# Patient Record
Sex: Male | Born: 1955 | Race: White | Hispanic: No | Marital: Single | Smoking: Former smoker
Health system: Southern US, Community
[De-identification: ages and names within clinical notes are randomized; demographics above are authoritative.]

## PROBLEM LIST (undated history)

## (undated) DIAGNOSIS — I5022 Chronic systolic (congestive) heart failure: Secondary | ICD-10-CM

## (undated) DIAGNOSIS — Q2381 Bicuspid aortic valve: Secondary | ICD-10-CM

## (undated) DIAGNOSIS — I509 Heart failure, unspecified: Secondary | ICD-10-CM

## (undated) HISTORY — PX: FOREIGN BODY REMOVAL: SHX962

## (undated) HISTORY — PX: HERNIA REPAIR: SHX51

## (undated) HISTORY — PX: ANKLE FRACTURE SURGERY: SHX122

---

## 1998-06-04 DIAGNOSIS — W3400XA Accidental discharge from unspecified firearms or gun, initial encounter: Secondary | ICD-10-CM

## 1998-06-04 HISTORY — DX: Accidental discharge from unspecified firearms or gun, initial encounter: W34.00XA

## 2007-09-20 ENCOUNTER — Emergency Department (HOSPITAL_COMMUNITY): Admission: EM | Admit: 2007-09-20 | Discharge: 2007-09-20 | Payer: Self-pay | Admitting: Emergency Medicine

## 2007-10-10 ENCOUNTER — Ambulatory Visit (HOSPITAL_COMMUNITY): Admission: RE | Admit: 2007-10-10 | Discharge: 2007-10-10 | Payer: Self-pay | Admitting: General Surgery
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
## 2010-10-17 NOTE — Op Note (Signed)
[name], [name] NO.:  [id number]   MEDICAL RECORD NO.:  [id number]          PATIENT TYPE:  AMB   LOCATION:  DAY                           FACILITY:  [facility]   PHYSICIAN:  Tilford Pillar, MD      DATE OF BIRTH:  May 19, 1956   DATE OF PROCEDURE:  DATE OF DISCHARGE:                               OPERATIVE REPORT   PREOPERATIVE DIAGNOSIS:  Left inguinal hernia, reducible.   POSTOPERATIVE DIAGNOSIS:  Left inguinal hernia, reducible.   PROCEDURE:  Left inguinal herniorrhaphy with mesh.   SURGEON:  Tilford Pillar, MD   ANESTHESIA:  General endotracheal local anesthetic 1% Sensorcaine plain.   ESTIMATED BLOOD LOSS:  Minimal.   SPECIMEN:  None.   INDICATIONS:  The patient is a 55 year old male who presented to my  office as an outpatient with an acute onset of increasing left lower  quadrant and left groin pain.  On evaluation, he was determined to have  a left inguinal hernia, this was quite large but easily reducible.  The  risks, benefits, and alternatives of repair were discussed at length  with the patient including but not limited to bleeding, infection, mesh  infection requiring the removal, and interval repair of the hernia,  ischemic orchitis, paresthesias as well as possibility of testicular  loss as well as the possibility of intraoperative pulmonary or cardiac  events.  The patient's questions and concerns were addressed.  The  patient was consented for planned procedure.   OPERATION:  The patient was taken to the operating room and was placed  in the supine position on the operating table.  General anesthetic was  administered.  Once the patient was asleep, he was endotracheally  intubated by Anesthesia.  At this point, the patient's left groin was  prepped and draped in the usual fashion with Betadine solution.  A  marking pen was utilized to mark the planned site of incision.  Scalpel  was utilized to make the initial skin incision.  Additional  dissection  down through subcuticular tissue including Scarpa layer was carried out  using electrocautery, this was carried out down to the external oblique  fascia, which was scored with the scalpel, and then the defect was  enlarged medially and laterally with Metzenbaum scissors.  At this  point, the inguinal canal was obtained.  A large hernia was identified.  Blunt digital dissection was utilized to create the window behind the  cord structures.  A Penrose drain was then placed around this to help  elevate the cord structures, then the hernia sac into the field.  At  this point, using a combination of blunt DeBakey dissection and  electrocautery dissection, the hernia sac was separated free from the  cord structures.  The internal inguinal ring was noted to have a large  sizeable defect.  Since the hernia base had a large defect, a 3-0  Prolene suture was placed in a pursestring fashion to help reduce the  size of the hernia neck.  The hernia was reduced through the  pursestring, and the pursestring was secured.  At this  point, a medium  mesh plug was then placed and the remaining defect was packed to the  surrounding inguinal ring wall with a 2-0 Novofil suture x2, the suture  is in place.  A mesh overlay was brought to the field.  This was packed  medially over the pubic tubercle, superiorly to the conjoined tendon,  and inferiorly to the shelving portion of the inguinal ligament.  The  keyhole defect was enlarged, allowing placement around the cord  structures, and then the resulting tails were tied behind the cord  structures laterally with a 2-0 Novofil.  The tails were then tucked  underneath the external oblique fascia with the mesh in excellent  position.  I was quite pleased with the repair.  At this point, the  wound was irrigated.  A 2-0 Vicryl was utilized to reapproximate the  external oblique fascia.  Local anesthetic was instilled.  Then, a 3-0  Vicryl was utilized to  reapproximate the Scarpa layer, followed by 4-0  Monocryl to reapproximate the skin edges in a running subcuticular  suture.  Skin was washed and dried with moistened dry towel.  Benzoin  was applied around the incision.  Half-inch Steri-Strips were placed,  and the drapes removed.  The patient was allowed to come out of general  anesthetic.  He was transferred back to a regular hospital bed and was  transferred to postanesthetic care unit in stable condition.   At the conclusion of the procedure, all instrument, sponge, and needle  counts were correct.   The patient tolerated the procedure well.       Tilford Pillar, MD  Electronically Signed     BZ/MEDQ  D:  10/10/2007  T:  10/11/2007  Job:  [id number]

## 2019-11-19 ENCOUNTER — Encounter (HOSPITAL_COMMUNITY): Payer: Self-pay | Admitting: *Deleted

## 2019-11-19 ENCOUNTER — Emergency Department (HOSPITAL_COMMUNITY)
Admission: EM | Admit: 2019-11-19 | Discharge: 2019-11-19 | Disposition: A | Discharge status: Home / Self Care | Payer: Self-pay | Attending: Emergency Medicine | Admitting: Emergency Medicine

## 2019-11-19 ENCOUNTER — Other Ambulatory Visit: Payer: Self-pay

## 2019-11-19 DIAGNOSIS — R202 Paresthesia of skin: Secondary | ICD-10-CM

## 2019-11-19 DIAGNOSIS — Z885 Allergy status to narcotic agent status: Secondary | ICD-10-CM | POA: Insufficient documentation

## 2019-11-19 DIAGNOSIS — Z880 Allergy status to penicillin: Secondary | ICD-10-CM | POA: Insufficient documentation

## 2019-11-19 DIAGNOSIS — Z87891 Personal history of nicotine dependence: Secondary | ICD-10-CM | POA: Insufficient documentation

## 2019-11-19 DIAGNOSIS — M722 Plantar fascial fibromatosis: Secondary | ICD-10-CM | POA: Insufficient documentation

## 2019-11-19 LAB — CBC
HCT: 41.8 % (ref 39.0–52.0)
Hemoglobin: 14 g/dL (ref 13.0–17.0)
MCH: 31 pg (ref 26.0–34.0)
MCHC: 33.5 g/dL (ref 30.0–36.0)
MCV: 92.7 fL (ref 80.0–100.0)
Platelets: 171 10*3/uL (ref 150–400)
RBC: 4.51 MIL/uL (ref 4.22–5.81)
RDW: 12.9 % (ref 11.5–15.5)
WBC: 4.5 10*3/uL (ref 4.0–10.5)
nRBC: 0 % (ref 0.0–0.2)

## 2019-11-19 LAB — COMPREHENSIVE METABOLIC PANEL
ALT: 18 U/L (ref 0–44)
AST: 18 U/L (ref 15–41)
Albumin: 4.5 g/dL (ref 3.5–5.0)
Alkaline Phosphatase: 95 U/L (ref 38–126)
Anion gap: 10 (ref 5–15)
BUN: 9 mg/dL (ref 8–23)
CO2: 27 mmol/L (ref 22–32)
Calcium: 9.4 mg/dL (ref 8.9–10.3)
Chloride: 98 mmol/L (ref 98–111)
Creatinine, Ser: 0.94 mg/dL (ref 0.61–1.24)
GFR calc Af Amer: >60 mL/min (ref >60)
GFR calc non Af Amer: >60 mL/min (ref >60)
Glucose, Bld: 117 mg/dL — ABNORMAL HIGH (ref 70–99)
Potassium: 4.5 mmol/L (ref 3.5–5.1)
Sodium: 135 mmol/L (ref 135–145)
Total Bilirubin: 0.5 mg/dL (ref 0.3–1.2)
Total Protein: 8 g/dL (ref 6.5–8.1)

## 2019-11-19 LAB — MAGNESIUM: Magnesium: 2.3 mg/dL (ref 1.7–2.4)
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
## 2019-11-19 NOTE — Clinical Social Work Note (Signed)
Transition of Care [facility]) - Emergency Department Mini Assessment  Patient Details  Name: [name] Daniel MRN: [id number] Date of Birth: 03/26/1956  Transition of Care [facility]) CM/SW Contact:    Ewing Schlein, LCSW Phone Number: 11/19/2019, 10:07 PM  Clinical Narrative: Patient is a 65 year old patient who presented to the ED for bilateral foot numbness. TOC received consult for PCP needs as patient is uninsured and does not currently have a PCP. CSW spoke with patient regarding [facility] and financial counselor referrals. Patient agreeable to both. CSW provided patient with [facility] patient packet. CSW left voicemail with [facility] to make referral. CSW emailed financial counselor, Jerene Dilling, to refer patient for possible Medicaid eligibility. TOC signing off.  ED Mini Assessment: What brought you to the Emergency Department? : Bilateral foot numbness Barriers to Discharge: Barriers Resolved Barrier interventions: Referral to [facility]; referral to financial counselor Means of departure: Car Interventions which prevented an admission or readmission: Other (must enter comment) (Referral to [facility] for PCP needs; referral to financial counselor)  Patient Contact and Communications Key Contact 1: [facility] Key Contact 2: [name] Daniel (financial counselor) Contact Date: 11/19/19,     Patient states their goals for this hospitalization and ongoing recovery are:: Get connected to PCP  Admission diagnosis:  Feet Numbness There are no problems to display for this patient.  PCP:  Patient, No Pcp Per Pharmacy:  No Pharmacies Listed

## 2019-11-19 NOTE — Discharge Instructions (Signed)
Your physical exam and history is consistent with peripheral neuropathy and plantar fasciitis.  You may ultimately require referral to a neurologist for your peripheral neuropathy and referral to a podiatrist for your plantar fasciitis, however it is most important that you first establish with a primary care provider.  You need to have continuity of care with a single provider who can then refer you elsewhere, if needed.  I have placed an order for you to be consulted by our transitions of care team to help you find a PCP.  Please call around to get established with somebody as soon as possible.  Your symptoms do not improve until their further work-up.  There is no emergent condition that requires intervention here today.  Continue with over-the-counter medications for symptomatic relief of your discomfort until you can be seen by a primary care provider.  Return to the ED or seek immediate medical attention to experience any new or worsening symptoms.

## 2019-11-19 NOTE — ED Triage Notes (Signed)
Pt with bilateral foot numbness for past 5 days. Pt denies hx of DM with mother did.

## 2019-12-09 ENCOUNTER — Ambulatory Visit: Payer: Self-pay | Admitting: Physician Assistant

## 2019-12-09 ENCOUNTER — Encounter: Payer: Self-pay | Admitting: Physician Assistant

## 2019-12-09 VITALS — BP 140/100 | HR 71 | Temp 97.5°F | Ht 71.5 in | Wt 155.8 lb

## 2019-12-09 DIAGNOSIS — Z125 Encounter for screening for malignant neoplasm of prostate: Secondary | ICD-10-CM

## 2019-12-09 DIAGNOSIS — Z13220 Encounter for screening for lipoid disorders: Secondary | ICD-10-CM

## 2019-12-09 DIAGNOSIS — Z7689 Persons encountering health services in other specified circumstances: Secondary | ICD-10-CM

## 2019-12-09 DIAGNOSIS — R7309 Other abnormal glucose: Secondary | ICD-10-CM

## 2019-12-09 DIAGNOSIS — I10 Essential (primary) hypertension: Secondary | ICD-10-CM

## 2019-12-09 DIAGNOSIS — Z131 Encounter for screening for diabetes mellitus: Secondary | ICD-10-CM

## 2019-12-09 DIAGNOSIS — M722 Plantar fascial fibromatosis: Secondary | ICD-10-CM

## 2019-12-09 DIAGNOSIS — Z1211 Encounter for screening for malignant neoplasm of colon: Secondary | ICD-10-CM

## 2019-12-09 DIAGNOSIS — M25572 Pain in left ankle and joints of left foot: Secondary | ICD-10-CM

## 2019-12-09 DIAGNOSIS — Z9889 Other specified postprocedural states: Secondary | ICD-10-CM

## 2019-12-09 MED ORDER — LISINOPRIL 10 MG PO TABS: 10.0000 mg | ORAL_TABLET | Freq: Every day | ORAL | 0 refills | Status: DC

## 2019-12-09 NOTE — Patient Instructions (Signed)
Plantar Fasciitis  Plantar fasciitis is a painful foot condition that affects the heel. It occurs when the band of tissue that connects the toes to the heel bone (plantar fascia) becomes irritated. This can happen as the result of exercising too much or doing other repetitive activities (overuse injury). The pain from plantar fasciitis can range from mild irritation to severe pain that makes it difficult to walk or move. The pain is usually worse in the morning after sleeping, or after sitting or lying down for a while. Pain may also be worse after long periods of walking or standing. What are the causes? This condition may be caused by:  Standing for long periods of time.  Wearing shoes that do not have good arch support.  Doing activities that put stress on joints (high-impact activities), including running, aerobics, and ballet.  Being overweight.  An abnormal way of walking (gait).  Tight muscles in the back of your lower leg (calf).  High arches in your feet.  Starting a new athletic activity. What are the signs or symptoms? The main symptom of this condition is heel pain. Pain may:  Be worse with first steps after a time of rest, especially in the morning after sleeping or after you have been sitting or lying down for a while.  Be worse after long periods of standing still.  Decrease after 30-45 minutes of activity, such as gentle walking. How is this diagnosed? This condition may be diagnosed based on your medical history and your symptoms. Your health care provider may ask questions about your activity level. Your health care provider will do a physical exam to check for:  A tender area on the bottom of your foot.  A high arch in your foot.  Pain when you move your foot.  Difficulty moving your foot. You may have imaging tests to confirm the diagnosis, such as:  X-rays.  Ultrasound.  MRI. How is this treated? Treatment for plantar fasciitis depends on how  severe your condition is. Treatment may include:  Rest, ice, applying pressure (compression), and raising the affected foot (elevation). This may be called RICE therapy. Your health care provider may recommend RICE therapy along with over-the-counter pain medicines to manage your pain.  Exercises to stretch your calves and your plantar fascia.  A splint that holds your foot in a stretched, upward position while you sleep (night splint).  Physical therapy to relieve symptoms and prevent problems in the future.  Injections of steroid medicine (cortisone) to relieve pain and inflammation.  Stimulating your plantar fascia with electrical impulses (extracorporeal shock wave therapy). This is usually the last treatment option before surgery.  Surgery, if other treatments have not worked after 12 months. Follow these instructions at home:  Managing pain, stiffness, and swelling  If directed, put ice on the painful area: ? Put ice in a plastic bag, or use a frozen bottle of water. ? Place a towel between your skin and the bag or bottle. ? Roll the bottom of your foot over the bag or bottle. ? Do this for 20 minutes, 2-3 times a day.  Wear athletic shoes that have air-sole or gel-sole cushions, or try wearing soft shoe inserts that are designed for plantar fasciitis.  Raise (elevate) your foot above the level of your heart while you are sitting or lying down. Activity  Avoid activities that cause pain. Ask your health care provider what activities are safe for you.  Do physical therapy exercises and stretches as told   by your health care provider.  Try activities and forms of exercise that are easier on your joints (low-impact). Examples include swimming, water aerobics, and biking. General instructions  Take over-the-counter and prescription medicines only as told by your health care provider.  Wear a night splint while sleeping, if told by your health care provider. Loosen the splint  if your toes tingle, become numb, or turn cold and blue.  Maintain a healthy weight, or work with your health care provider to lose weight as needed.  Keep all follow-up visits as told by your health care provider. This is important. Contact a health care provider if you:  Have symptoms that do not go away after caring for yourself at home.  Have pain that gets worse.  Have pain that affects your ability to move or do your daily activities. Summary  Plantar fasciitis is a painful foot condition that affects the heel. It occurs when the band of tissue that connects the toes to the heel bone (plantar fascia) becomes irritated.  The main symptom of this condition is heel pain that may be worse after exercising too much or standing still for a long time.  Treatment varies, but it usually starts with rest, ice, compression, and elevation (RICE therapy) and over-the-counter medicines to manage pain. This information is not intended to replace advice given to you by your health care provider. Make sure you discuss any questions you have with your health care provider. Document Revised: 05/03/2017 Document Reviewed: 03/18/2017 Elsevier Patient Education  2020 [employer]  

## 2019-12-09 NOTE — Progress Notes (Signed)
BP (!) 140/100   Pulse 71   Temp (!) 97.5 F (36.4 C)   Ht 5' 11.5" (1.816 m)   Wt 155 lb 12 oz (70.6 kg)   SpO2 100%   BMI 21.42 kg/m    Subjective:    Patient ID: [name] Daniel, male    DOB: 1956/05/02, 64 y.o.   MRN: [id number]  HPI: [name] RASTETTER is a 64 y.o. male presenting on 12/09/2019 for New Patient (Initial Visit) and Foot Problem (R foot. )   HPI   Pt had a negative covid 19 screening questionnaire.   Pt is a 63yoM who presents to establish care.    He has not yet gotten covid vaccination.  Pt does not work.  He is ""drawing off social security.  He previously worked as a Education administrator.    He c/o right  foot pain.  He recently was diagnosed with plantar fasciitis.  He says they didn't tell him anything except some exercises which he ddin't do but a few times because he thought it wasn't doing anything.  He thinks soaking it in hot water helps.  He Also is having pain  lefl ankle.  He previously had surgery with pins and rods placed.  cmp and cbd done 11/19/19- elevated glucose    Relevant past medical, surgical, family and social history reviewed and updated as indicated. Interim medical history since our last visit reviewed. Allergies and medications reviewed and updated.  No current outpatient medications on file.   Review of Systems  Per HPI unless specifically indicated above     Objective:    BP (!) 140/100   Pulse 71   Temp (!) 97.5 F (36.4 C)   Ht 5' 11.5" (1.816 m)   Wt 155 lb 12 oz (70.6 kg)   SpO2 100%   BMI 21.42 kg/m   Wt Readings from Last 3 Encounters:  12/09/19 155 lb 12 oz (70.6 kg)  11/19/19 165 lb (74.8 kg)    Physical Exam Vitals reviewed.  Constitutional:      General: He is not in acute distress.    Appearance: He is well-developed.  HENT:     Head: Normocephalic and atraumatic.  Eyes:     Conjunctiva/sclera: Conjunctivae normal.     Pupils: Pupils are equal, round, and reactive to light.  Neck:     Thyroid: No  thyromegaly.  Cardiovascular:     Rate and Rhythm: Normal rate and regular rhythm.  Pulmonary:     Effort: Pulmonary effort is normal.     Breath sounds: Normal breath sounds. No wheezing or rales.  Abdominal:     General: Bowel sounds are normal.     Palpations: Abdomen is soft. There is no mass.     Tenderness: There is no abdominal tenderness.  Musculoskeletal:     Cervical back: Neck supple.     Right lower leg: No edema.     Left lower leg: No edema.     Left ankle: Tenderness present. Decreased range of motion. Normal pulse.     Right foot: Tenderness present. Normal pulse.     Left foot: Normal pulse.     Comments: Very mild plantar tenderness R foot plantar surface.  L ankle with tenderness.  L ankle with decreased ROM.  Lymphadenopathy:     Cervical: No cervical adenopathy.  Skin:    General: Skin is warm and dry.     Findings: No rash.  Neurological:     Mental  Status: He is alert and oriented to person, place, and time.  Psychiatric:        Behavior: Behavior normal.          Assessment & Plan:    Encounter Diagnoses  Name Primary?  . Encounter to establish care Yes  . Essential hypertension   . Plantar fasciitis   . Left ankle pain, unspecified chronicity   . History of ankle surgery   . Screening cholesterol level   . Screening for diabetes mellitus   . Screening for malignant neoplasm of prostate   . Screening for colon cancer   . Elevated glucose       -will order additional labs -Start lisinopril for HTN ( rx to [facility]) -ifobt given for colon cancer screening -Xray L ankle due to pain and previous surgery -pt was given application for [facility] financial assistance -pt is counseled to use Ice, avoid walking in bare-feet, encouraged to do exercises and is given reading information on  plantar fasciitis -pt encouraged to Get covid vaccination  On chart review after pt checked out, noted that pt lost 30 pounds since OV 08/06/17.    Will discuss with pt at follow up appointment  Pt to follow up 1 month.  He is to contact office sooner prn.

## 2019-12-13 ENCOUNTER — Other Ambulatory Visit: Payer: Self-pay | Admitting: Physician Assistant

## 2019-12-13 DIAGNOSIS — Z1211 Encounter for screening for malignant neoplasm of colon: Secondary | ICD-10-CM

## 2019-12-16 ENCOUNTER — Ambulatory Visit: Payer: Self-pay | Admitting: Physician Assistant

## 2019-12-30 ENCOUNTER — Ambulatory Visit: Payer: Self-pay | Admitting: Physician Assistant

## 2019-12-31 ENCOUNTER — Other Ambulatory Visit: Payer: Self-pay

## 2019-12-31 ENCOUNTER — Other Ambulatory Visit (HOSPITAL_COMMUNITY)
Admission: RE | Admit: 2019-12-31 | Discharge: 2019-12-31 | Disposition: A | Discharge status: Home / Self Care | Payer: Self-pay | Source: Ambulatory Visit | Attending: Physician Assistant | Admitting: Physician Assistant

## 2019-12-31 ENCOUNTER — Ambulatory Visit (HOSPITAL_COMMUNITY)
Admission: RE | Admit: 2019-12-31 | Discharge: 2019-12-31 | Disposition: A | Discharge status: Home / Self Care | Payer: Self-pay | Source: Ambulatory Visit | Attending: Physician Assistant | Admitting: Physician Assistant

## 2019-12-31 DIAGNOSIS — Z9889 Other specified postprocedural states: Secondary | ICD-10-CM | POA: Insufficient documentation

## 2019-12-31 DIAGNOSIS — M25572 Pain in left ankle and joints of left foot: Secondary | ICD-10-CM | POA: Insufficient documentation

## 2019-12-31 DIAGNOSIS — Z125 Encounter for screening for malignant neoplasm of prostate: Secondary | ICD-10-CM | POA: Insufficient documentation

## 2019-12-31 DIAGNOSIS — R7309 Other abnormal glucose: Secondary | ICD-10-CM | POA: Insufficient documentation

## 2019-12-31 DIAGNOSIS — Z131 Encounter for screening for diabetes mellitus: Secondary | ICD-10-CM | POA: Insufficient documentation

## 2019-12-31 DIAGNOSIS — Z13220 Encounter for screening for lipoid disorders: Secondary | ICD-10-CM | POA: Insufficient documentation

## 2019-12-31 LAB — LIPID PANEL
Cholesterol: 146 mg/dL (ref 0–200)
HDL: 47 mg/dL (ref >40)
LDL Cholesterol: 90 mg/dL (ref 0–99)
Total CHOL/HDL Ratio: 3.1 RATIO
Triglycerides: 44 mg/dL (ref <150)
VLDL: 9 mg/dL (ref 0–40)

## 2019-12-31 LAB — HEMOGLOBIN A1C
Hgb A1c MFr Bld: 6.2 % — ABNORMAL HIGH (ref 4.8–5.6)
Mean Plasma Glucose: 131.24 mg/dL

## 2019-12-31 LAB — PSA: Prostatic Specific Antigen: 0.91 ng/mL (ref 0.00–4.00)

## 2020-01-20 ENCOUNTER — Other Ambulatory Visit: Payer: Self-pay

## 2020-01-20 ENCOUNTER — Encounter: Payer: Self-pay | Admitting: Physician Assistant

## 2020-01-20 ENCOUNTER — Ambulatory Visit: Payer: Self-pay | Admitting: Physician Assistant

## 2020-01-20 VITALS — BP 132/98 | HR 86 | Temp 98.4°F | Ht 71.5 in | Wt 161.5 lb

## 2020-01-20 DIAGNOSIS — R7303 Prediabetes: Secondary | ICD-10-CM

## 2020-01-20 DIAGNOSIS — M25572 Pain in left ankle and joints of left foot: Secondary | ICD-10-CM

## 2020-01-20 DIAGNOSIS — I10 Essential (primary) hypertension: Secondary | ICD-10-CM

## 2020-01-20 DIAGNOSIS — Z9889 Other specified postprocedural states: Secondary | ICD-10-CM

## 2020-01-20 MED ORDER — AMLODIPINE BESYLATE 5 MG PO TABS
5.0000 mg | ORAL_TABLET | Freq: Every day | ORAL | 1 refills | Status: DC
Start: 2020-01-20 — End: 2020-03-02

## 2020-01-20 NOTE — Patient Instructions (Signed)
Prediabetes Prediabetes is the condition of having a blood sugar (blood glucose) level that is higher than it should be, but not high enough for you to be diagnosed with type 2 diabetes. Having prediabetes puts you at risk for developing type 2 diabetes (type 2 diabetes mellitus). Prediabetes may be called impaired glucose tolerance or impaired fasting glucose. Prediabetes usually does not cause symptoms. Your health care provider can diagnose this condition with blood tests. You may be tested for prediabetes if you are overweight and if you have at least one other risk factor for prediabetes. What is blood glucose, and how is it measured? Blood glucose refers to the amount of glucose in your bloodstream. Glucose comes from eating foods that contain sugars and starches (carbohydrates), which the body breaks down into glucose. Your blood glucose level may be measured in mg/dL (milligrams per deciliter) or mmol/L (millimoles per liter). Your blood glucose may be checked with one or more of the following blood tests:  A fasting blood glucose (FBG) test. You will not be allowed to eat (you will fast) for 8 hours or longer before a blood sample is taken. ? A normal range for FBG is 70-100 mg/dl (3.9-5.6 mmol/L).  An A1c (hemoglobin A1c) blood test. This test provides information about blood glucose control over the previous 2?3months.  An oral glucose tolerance test (OGTT). This test measures your blood glucose at two times: ? After fasting. This is your baseline level. ? Two hours after you drink a beverage that contains glucose. You may be diagnosed with prediabetes:  If your FBG is 100?125 mg/dL (5.6-6.9 mmol/L).  If your A1c level is 5.7?6.4%.  If your OGTT result is 140?199 mg/dL (7.8-11 mmol/L). These blood tests may be repeated to confirm your diagnosis. How can this condition affect me? The pancreas produces a hormone (insulin) that helps to move glucose from the bloodstream into cells.  When cells in the body do not respond properly to insulin that the body makes (insulin resistance), excess glucose builds up in the blood instead of going into cells. As a result, high blood glucose (hyperglycemia) can develop, which can cause many complications. Hyperglycemia is a symptom of prediabetes. Having high blood glucose for a long time is dangerous. Too much glucose in your blood can damage your nerves and blood vessels. Long-term damage can lead to complications from diabetes, which may include:  Heart disease.  Stroke.  Blindness.  Kidney disease.  Depression.  Poor circulation in the feet and legs, which could lead to surgical removal (amputation) in severe cases. What can increase my risk? Risk factors for prediabetes include:  Having a family member with type 2 diabetes.  Being overweight or obese.  Being older than age 45.  Being of American Indian, African-American, Hispanic/Latino, or Asian/Pacific Islander descent.  Having an inactive (sedentary) lifestyle.  Having a history of heart disease.  History of gestational diabetes or polycystic ovary syndrome (PCOS), in women.  Having low levels of good cholesterol (HDL-C) or high levels of blood fats (triglycerides).  Having high blood pressure. What actions can I take to prevent diabetes?      Be physically active. ? Do moderate-intensity physical activity for 30 or more minutes on 5 or more days of the week, or as much as told by your health care provider. This could be brisk walking, biking, or water aerobics. ? Ask your health care provider what activities are safe for you. A mix of physical activities may be best, such as   walking, swimming, cycling, and strength training.  Lose weight as told by your health care provider. ? Losing 5-7% of your body weight can reverse insulin resistance. ? Your health care provider can determine how much weight loss is best for you and can help you lose weight  safely.  Follow a healthy meal plan. This includes eating lean proteins, complex carbohydrates, fresh fruits and vegetables, low-fat dairy products, and healthy fats. ? Follow instructions from your health care provider about eating or drinking restrictions. ? Make an appointment to see a diet and nutrition specialist (registered dietitian) to help you create a healthy eating plan that is right for you.  Do not smoke or use any tobacco products, such as cigarettes, chewing tobacco, and e-cigarettes. If you need help quitting, ask your health care provider.  Take over-the-counter and prescription medicines as told by your health care provider. You may be prescribed medicines that help lower the risk of type 2 diabetes.  Keep all follow-up visits as told by your health care provider. This is important. Summary  Prediabetes is the condition of having a blood sugar (blood glucose) level that is higher than it should be, but not high enough for you to be diagnosed with type 2 diabetes.  Having prediabetes puts you at risk for developing type 2 diabetes (type 2 diabetes mellitus).  To help prevent type 2 diabetes, make lifestyle changes such as being physically active and eating a healthy diet. Lose weight as told by your health care provider. This information is not intended to replace advice given to you by your health care provider. Make sure you discuss any questions you have with your health care provider. Document Revised: 09/12/2018 Document Reviewed: 07/12/2015 Elsevier Patient Education  2020 [employer]vier Inc.  

## 2020-01-20 NOTE — Progress Notes (Signed)
BP (!) 132/98   Pulse 86   Temp 98.4 F (36.9 C)   Ht 5' 11.5" (1.816 m)   Wt 161 lb 8 oz (73.3 kg)   SpO2 98%   BMI 22.21 kg/m    Subjective:    Patient ID: [name] Daniel, male    DOB: 1956-01-07, 64 y.o.   MRN: [id number]  HPI: [name] Daniel is a 64 y.o. male presenting on 01/20/2020 for Hypertension (pt only took his lisinopril for 7 days the self d/c due to almost "falling out in the yard" pt states he felt like passing out and lightheaded)   HPI    Pt had a negative covid 19 screening questionnaire.     Pt is 63yoM with HTN.  Pt stopped his lisinopril because it made him feel bad.    Pt got approved for CAFA  Weight on 08/06/17- 170 lb so weigh reasonably stable.  Pt agrees that his weight is stable.    He has not yet gotten covid vaccination  He still has his iFOBT at home.   His L ankle is still hurting.      Relevant past medical, surgical, family and social history reviewed and updated as indicated. Interim medical history since our last visit reviewed. Allergies and medications reviewed and updated.  CURRENT MEDS: none  Review of Systems  Per HPI unless specifically indicated above     Objective:    BP (!) 132/98   Pulse 86   Temp 98.4 F (36.9 C)   Ht 5' 11.5" (1.816 m)   Wt 161 lb 8 oz (73.3 kg)   SpO2 98%   BMI 22.21 kg/m   Wt Readings from Last 3 Encounters:  01/20/20 161 lb 8 oz (73.3 kg)  12/09/19 155 lb 12 oz (70.6 kg)  11/19/19 165 lb (74.8 kg)    Physical Exam Vitals reviewed.  Constitutional:      General: He is not in acute distress.    Appearance: He is well-developed. He is not ill-appearing.  HENT:     Head: Normocephalic and atraumatic.  Cardiovascular:     Rate and Rhythm: Normal rate and regular rhythm.  Pulmonary:     Effort: Pulmonary effort is normal.     Breath sounds: Normal breath sounds. No wheezing.  Abdominal:     General: Bowel sounds are normal.     Palpations: Abdomen is soft.     Tenderness:  There is no abdominal tenderness.  Musculoskeletal:     Cervical back: Neck supple.     Right lower leg: No edema.     Left lower leg: No edema.     Left ankle: Tenderness present. Decreased range of motion.     Comments: Non-point tender L ankle with inability to flex.  No swelling seen  Lymphadenopathy:     Cervical: No cervical adenopathy.  Skin:    General: Skin is warm and dry.  Neurological:     Mental Status: He is alert and oriented to person, place, and time.  Psychiatric:        Behavior: Behavior normal.     Results for orders placed or performed during the hospital encounter of 12/31/19  PSA  Result Value Ref Range   Prostatic Specific Antigen 0.91 0.00 - 4.00 ng/mL  Hemoglobin A1c  Result Value Ref Range   Hgb A1c MFr Bld 6.2 (H) 4.8 - 5.6 %   Mean Plasma Glucose 131.24 mg/dL  Lipid panel  Result Value Ref  Range   Cholesterol 146 0 - 200 mg/dL   Triglycerides 44 <407 mg/dL   HDL 47 >68 mg/dL   Total CHOL/HDL Ratio 3.1 RATIO   VLDL 9 0 - 40 mg/dL   LDL Cholesterol 90 0 - 99 mg/dL      Assessment & Plan:    Encounter Diagnoses  Name Primary?  . Essential hypertension Yes  . Prediabetes   . Left ankle pain, unspecified chronicity   . History of ankle surgery       -reviewed labs with pt -will start amldopine for htn  -counseled on prediabetes and gave handout -encouraged pt to get covid vaccination -encouraged pt to return ifbot/colon cancer screening test -Refer to orthopedics for continuing pain L ankle with history surgery -pt to follow up 1 month to recheck BP.  Pt to contact office sooner prn

## 2020-02-09 ENCOUNTER — Encounter: Payer: Self-pay | Admitting: Orthopaedic Surgery

## 2020-02-09 ENCOUNTER — Other Ambulatory Visit: Payer: Self-pay

## 2020-02-09 ENCOUNTER — Ambulatory Visit (INDEPENDENT_AMBULATORY_CARE_PROVIDER_SITE_OTHER): Payer: Self-pay | Admitting: Orthopaedic Surgery

## 2020-02-09 VITALS — BP 154/100 | HR 76 | Ht 71.5 in | Wt 157.0 lb

## 2020-02-09 DIAGNOSIS — M25572 Pain in left ankle and joints of left foot: Secondary | ICD-10-CM

## 2020-02-09 MED ORDER — HYDROCODONE-ACETAMINOPHEN 5-325 MG PO TABS: ORAL_TABLET | ORAL | 0 refills | Status: DC
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
## 2020-02-17 ENCOUNTER — Ambulatory Visit: Payer: Self-pay | Admitting: Physician Assistant

## 2020-02-23 ENCOUNTER — Ambulatory Visit: Payer: Self-pay | Admitting: Orthopaedic Surgery

## 2020-02-29 ENCOUNTER — Ambulatory Visit (HOSPITAL_COMMUNITY)
Admission: RE | Admit: 2020-02-29 | Discharge: 2020-02-29 | Disposition: A | Discharge status: Home / Self Care | Payer: Self-pay | Source: Ambulatory Visit | Attending: Orthopaedic Surgery | Admitting: Orthopaedic Surgery

## 2020-02-29 ENCOUNTER — Other Ambulatory Visit: Payer: Self-pay

## 2020-02-29 DIAGNOSIS — M25572 Pain in left ankle and joints of left foot: Secondary | ICD-10-CM | POA: Insufficient documentation

## 2020-02-29 MED ORDER — GADOBUTROL 1 MMOL/ML IV SOLN
7.0000 mL | Freq: Once | INTRAVENOUS | Status: AC | PRN
  Administered 2020-02-29: 7 mL via INTRAVENOUS

## 2020-03-01 ENCOUNTER — Ambulatory Visit (INDEPENDENT_AMBULATORY_CARE_PROVIDER_SITE_OTHER): Payer: Self-pay | Admitting: Orthopaedic Surgery

## 2020-03-01 ENCOUNTER — Encounter: Payer: Self-pay | Admitting: Orthopaedic Surgery

## 2020-03-01 VITALS — Ht 71.5 in | Wt 157.0 lb

## 2020-03-01 DIAGNOSIS — M25572 Pain in left ankle and joints of left foot: Secondary | ICD-10-CM
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
## 2020-03-02 ENCOUNTER — Ambulatory Visit: Payer: Self-pay | Admitting: Physician Assistant

## 2020-03-02 ENCOUNTER — Other Ambulatory Visit: Payer: Self-pay

## 2020-03-02 ENCOUNTER — Encounter: Payer: Self-pay | Admitting: Physician Assistant

## 2020-03-02 VITALS — BP 110/86 | HR 93 | Temp 98.2°F | Ht 71.5 in | Wt 157.4 lb

## 2020-03-02 DIAGNOSIS — I10 Essential (primary) hypertension: Secondary | ICD-10-CM

## 2020-03-02 MED ORDER — AMLODIPINE BESYLATE 5 MG PO TABS: 5.0000 mg | ORAL_TABLET | Freq: Every day | ORAL | 1 refills | Status: DC

## 2020-03-02 NOTE — Progress Notes (Signed)
   BP 110/86   Pulse 93   Temp 98.2 F (36.8 C)   Ht 5' 11.5" (1.816 m)   Wt 157 lb 6.4 oz (71.4 kg)   SpO2 98%   BMI 21.65 kg/m    Subjective:    Patient ID: [name] Daniel, male    DOB: 1956/05/10, 64 y.o.   MRN: [id number]  HPI: [name] Daniel is a 64 y.o. male presenting on 03/02/2020 for Hypertension   HPI   Pt had a negative covid 19 screening questionnaire.    Pt is 63yoM with follow up for HTN today.  He says he is feeling fine.    He is seeing orthopedics for his foot/ankle.  He has still not gotten covid vaccination.    Relevant past medical, surgical, family and social history reviewed and updated as indicated. Interim medical history since our last visit reviewed. Allergies and medications reviewed and updated.   Current Outpatient Medications:  .  amLODipine (NORVASC) 5 MG tablet, Take 1 tablet (5 mg total) by mouth daily., Disp: 30 tablet, Rfl: 1 .  HYDROcodone-acetaminophen (NORCO/VICODIN) 5-325 MG tablet, One tablet every four hours as needed for acute pain.  Limit of five days per [place]. (Patient not taking: Reported on 03/01/2020), Disp: 30 tablet, Rfl: 0    Review of Systems  Per HPI unless specifically indicated above     Objective:    BP 110/86   Pulse 93   Temp 98.2 F (36.8 C)   Ht 5' 11.5" (1.816 m)   Wt 157 lb 6.4 oz (71.4 kg)   SpO2 98%   BMI 21.65 kg/m   Wt Readings from Last 3 Encounters:  03/02/20 157 lb 6.4 oz (71.4 kg)  03/01/20 157 lb (71.2 kg)  02/09/20 157 lb (71.2 kg)    Physical Exam Vitals reviewed.  Constitutional:      General: He is not in acute distress.    Appearance: He is well-developed.  HENT:     Head: Normocephalic and atraumatic.  Cardiovascular:     Rate and Rhythm: Normal rate and regular rhythm.  Pulmonary:     Effort: Pulmonary effort is normal.     Breath sounds: Normal breath sounds. No wheezing.  Abdominal:     General: Bowel sounds are normal.     Palpations: Abdomen is soft.      Tenderness: There is no abdominal tenderness.  Musculoskeletal:     Cervical back: Neck supple.     Right lower leg: No edema.     Left lower leg: No edema.  Lymphadenopathy:     Cervical: No cervical adenopathy.  Skin:    General: Skin is warm and dry.  Neurological:     Mental Status: He is alert and oriented to person, place, and time.  Psychiatric:        Attention and Perception: Attention normal.        Behavior: Behavior is cooperative.           Assessment & Plan:     Encounter Diagnosis  Name Primary?  . Essential hypertension Yes     -Pt to continue amlodipine -Pt counseled and encouraged to get covid vaccination -pt to continue with orthopedics per his recommendation -pt to follow up here 3 months.  He is to contact office sooner prn

## 2020-05-30 ENCOUNTER — Other Ambulatory Visit: Payer: Self-pay | Admitting: Physician Assistant

## 2020-05-30 DIAGNOSIS — I10 Essential (primary) hypertension: Secondary | ICD-10-CM

## 2020-06-14 ENCOUNTER — Encounter: Payer: Self-pay | Admitting: Physician Assistant

## 2020-06-14 ENCOUNTER — Ambulatory Visit: Payer: Self-pay | Admitting: Physician Assistant

## 2020-06-14 VITALS — BP 110/88 | HR 95 | Temp 96.0°F | Ht 71.5 in | Wt 152.0 lb

## 2020-06-14 DIAGNOSIS — I10 Essential (primary) hypertension: Secondary | ICD-10-CM

## 2020-06-14 DIAGNOSIS — R7303 Prediabetes: Secondary | ICD-10-CM

## 2020-06-14 NOTE — Progress Notes (Signed)
° °  BP 110/88    Pulse 95    Temp (!) 96 F (35.6 C)    Ht 5' 11.5" (1.816 m)    Wt 152 lb (68.9 kg)    SpO2 98%    BMI 20.90 kg/m    Subjective:    Patient ID: [name] Daniel, male    DOB: 1956/02/13, 65 y.o.   MRN: [id number]  HPI: [name] Daniel is a 65 y.o. male presenting on 06/14/2020 for Hypertension   HPI    Pt had a negative covid 19 screening questionnaire.   Chief Complaint  Patient presents with   Hypertension     Pt was expressing unhappiness that provider entered room at 1:50 for his 1:30 appointment and he had been having to wait.   Offered to reschedule his appointment when he had time enough to wait a reasonable time and he declined.     Relevant past medical, surgical, family and social history reviewed and updated as indicated. Interim medical history since our last visit reviewed. Allergies and medications reviewed and updated.    Current Outpatient Medications:    amLODipine (NORVASC) 5 MG tablet, Take 1 tablet (5 mg total) by mouth daily., Disp: 30 tablet, Rfl: 1   HYDROcodone-acetaminophen (NORCO/VICODIN) 5-325 MG tablet, One tablet every four hours as needed for acute pain.  Limit of five days per [place] statue. (Patient not taking: Reported on 06/14/2020), Disp: 30 tablet, Rfl: 0   Review of Systems  Per HPI unless specifically indicated above     Objective:    BP 110/88    Pulse 95    Temp (!) 96 F (35.6 C)    Ht 5' 11.5" (1.816 m)    Wt 152 lb (68.9 kg)    SpO2 98%    BMI 20.90 kg/m   Wt Readings from Last 3 Encounters:  06/14/20 152 lb (68.9 kg)  03/02/20 157 lb 6.4 oz (71.4 kg)  03/01/20 157 lb (71.2 kg)    Physical Exam Constitutional:      General: He is not in acute distress.    Appearance: He is not toxic-appearing.  HENT:     Head: Normocephalic and atraumatic.  Pulmonary:     Effort: No respiratory distress.  Neurological:     Mental Status: He is alert and oriented to person, place, and time.  Psychiatric:         Attention and Perception: Attention normal.        Speech: Speech normal.     Results for orders placed or performed during the hospital encounter of 12/31/19  PSA  Result Value Ref Range   Prostatic Specific Antigen 0.91 0.00 - 4.00 ng/mL  Hemoglobin A1c  Result Value Ref Range   Hgb A1c MFr Bld 6.2 (H) 4.8 - 5.6 %   Mean Plasma Glucose 131.24 mg/dL  Lipid panel  Result Value Ref Range   Cholesterol 146 0 - 200 mg/dL   Triglycerides 44 <734 mg/dL   HDL 47 >19 mg/dL   Total CHOL/HDL Ratio 3.1 RATIO   VLDL 9 0 - 40 mg/dL   LDL Cholesterol 90 0 - 99 mg/dL      Assessment & Plan:    Encounter Diagnoses  Name Primary?   Essential hypertension Yes   Prediabetes      Reviewed labs with pt No medication changes. Pt to follow up 3 months.  He is to contact office sooner prn

## 2020-06-14 NOTE — Patient Instructions (Signed)
Diabetes Care, 44(Suppl 1), S34-S39. https://doi.org/https://doi.org/10.2337/dc21-S003">  Prediabetes Prediabetes is when your blood sugar (blood glucose) level is higher than normal but not high enough for you to be diagnosed with type 2 diabetes. Having prediabetes puts you at risk for developing type 2 diabetes (type 2 diabetes mellitus). With certain lifestyle changes, you may be able to prevent or delay the onset of type 2 diabetes. This is important because type 2 diabetes can lead to serious complications, such as:  Heart disease.  Stroke.  Blindness.  Kidney disease.  Depression.  Poor circulation in the feet and legs. In severe cases, this could lead to surgical removal of a leg (amputation). What are the causes? The exact cause of prediabetes is not known. It may result from insulin resistance. Insulin resistance develops when cells in the body do not respond properly to insulin that the body makes. This can cause excess glucose to build up in the blood. High blood glucose (hyperglycemia) can develop. What increases the risk? The following factors may make you more likely to develop this condition:  You have a family member with type 2 diabetes.  You are older than 45 years.  You had a temporary form of diabetes during a pregnancy (gestational diabetes).  You had polycystic ovary syndrome (PCOS).  You are overweight or obese.  You are inactive (sedentary).  You have a history of heart disease, including problems with cholesterol levels, high levels of blood fats, or high blood pressure. What are the signs or symptoms? You may have no symptoms. If you do have symptoms, they may include:  Increased hunger.  Increased thirst.  Increased urination.  Vision changes, such as blurry vision.  Tiredness (fatigue). How is this diagnosed? This condition can be diagnosed with blood tests. Your blood glucose may be checked with one or more of the following tests:  A  fasting blood glucose (FBG) test. You will not be allowed to eat (you will fast) for at least 8 hours before a blood sample is taken.  An A1C blood test (hemoglobin A1C). This test provides information about blood glucose levels over the previous 2?3 months.  An oral glucose tolerance test (OGTT). This test measures your blood glucose at two points in time: ? After fasting. This is your baseline level. ? Two hours after you drink a beverage that contains glucose. You may be diagnosed with prediabetes if:  Your FBG is 100?125 mg/dL (5.6-6.9 mmol/L).  Your A1C level is 5.7?6.4% (39-46 mmol/mol).  Your OGTT result is 140?199 mg/dL (7.8-11 mmol/L). These blood tests may be repeated to confirm your diagnosis.   How is this treated? Treatment may include dietary and lifestyle changes to help lower your blood glucose and prevent type 2 diabetes from developing. In some cases, medicine may be prescribed to help lower the risk of type 2 diabetes. Follow these instructions at home: Nutrition  Follow a healthy meal plan. This includes eating lean proteins, whole grains, legumes, fresh fruits and vegetables, low-fat dairy products, and healthy fats.  Follow instructions from your health care provider about eating or drinking restrictions.  Meet with a dietitian to create a healthy eating plan that is right for you.   Lifestyle  Do moderate-intensity exercise for at least 30 minutes a day on 5 or more days each week, or as told by your health care provider. A mix of activities may be best, such as: ? Brisk walking, swimming, biking, and weight lifting.  Lose weight as told by your health   care provider. Losing 5-7% of your body weight can reverse insulin resistance.  Do not drink alcohol if: ? Your health care provider tells you not to drink. ? You are pregnant, may be pregnant, or are planning to become pregnant.  If you drink alcohol: ? Limit how much you use to:  0-1 drink a day for  women.  0-2 drinks a day for men. ? Be aware of how much alcohol is in your drink. In the [place] one drink equals one 12 oz bottle of beer (355 mL), one 5 oz glass of wine (148 mL), or one 1 oz glass of hard liquor (44 mL). General instructions  Take over-the-counter and prescription medicines only as told by your health care provider. You may be prescribed medicines that help lower the risk of type 2 diabetes.  Do not use any products that contain nicotine or tobacco, such as cigarettes, e-cigarettes, and chewing tobacco. If you need help quitting, ask your health care provider.  Keep all follow-up visits. This is important. Where to find more information  American Diabetes Association: www.diabetes.org  Academy of Nutrition and Dietetics: www.eatright.org  American Heart Association: www.heart.org Contact a health care provider if:  You have any of these symptoms: ? Increased hunger. ? Increased urination. ? Increased thirst. ? Fatigue. ? Vision changes, such as blurry vision. Get help right away if you:  Have shortness of breath.  Feel confused.  Vomit or feel like you may vomit. Summary  Prediabetes is when your blood sugar (blood glucose)level is higher than normal but not high enough for you to be diagnosed with type 2 diabetes.  Having prediabetes puts you at risk for developing type 2 diabetes (type 2 diabetes mellitus).  Make lifestyle changes such as eating a healthy diet and exercising regularly to help prevent diabetes. Lose weight as told by your health care provider. This information is not intended to replace advice given to you by your health care provider. Make sure you discuss any questions you have with your health care provider. Document Revised: 08/20/2019 Document Reviewed: 08/20/2019 Elsevier Patient Education  2021 Elsevier Inc.  

## 2020-09-05 ENCOUNTER — Other Ambulatory Visit: Payer: Self-pay | Admitting: Physician Assistant

## 2020-09-05 DIAGNOSIS — I10 Essential (primary) hypertension: Secondary | ICD-10-CM

## 2020-09-19 ENCOUNTER — Ambulatory Visit: Payer: Self-pay | Admitting: Physician Assistant

## 2020-10-06 ENCOUNTER — Ambulatory Visit: Payer: Self-pay | Admitting: Physician Assistant

## 2022-07-25 IMAGING — MR MR ANKLE*L* WO/W CM
7 series · 35 of 40 positions shown · IV contrast (gadavist)
Comparison: X-ray 12/31/2019

CLINICAL DATA: Left ankle pain for 2 years. History of multiple
prior left ankle surgery

EXAM:
MRI OF THE LEFT ANKLE WITHOUT AND WITH CONTRAST
TECHNIQUE: Multiplanar, multisequence MR imaging of the ankle was performed
before and after the administration of intravenous contrast.
CONTRAST:  7mL GADAVIST GADOBUTROL 1 MMOL/ML IV SOLN

[Series 3: T1 · axial · left · 4.0mm · 0.44mm/px · z∈[-85,+61]mm · 7 of 34 slices shown]
[im 1/34]
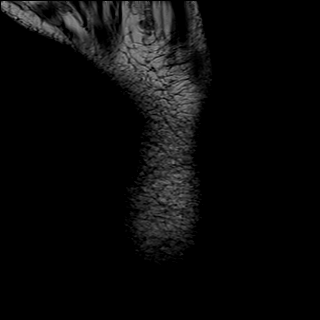
[im 6/34]
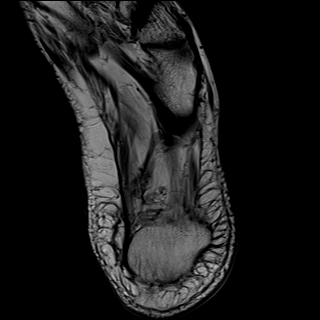
[im 12/34]
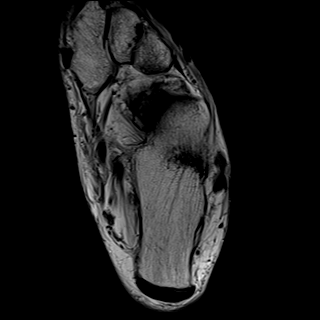
[im 17/34]
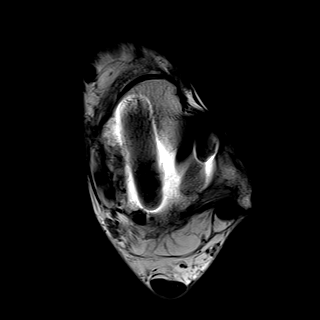
[im 23/34]
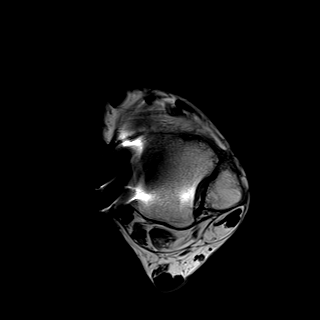
[im 28/34]
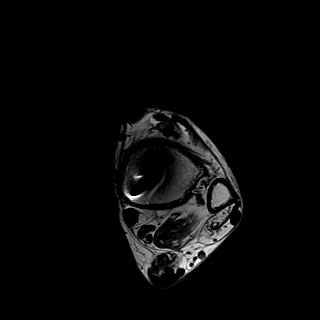
[im 34/34]
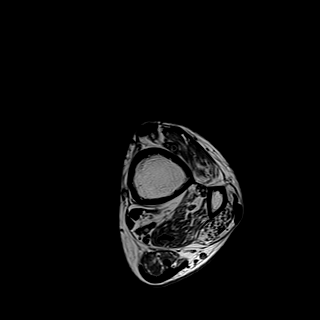

[Series 4: T2 fat-sat · axial · left · 4.0mm · 0.36mm/px · z∈[-86,+59]mm · 6 of 34 slices shown]
[im 1/34]
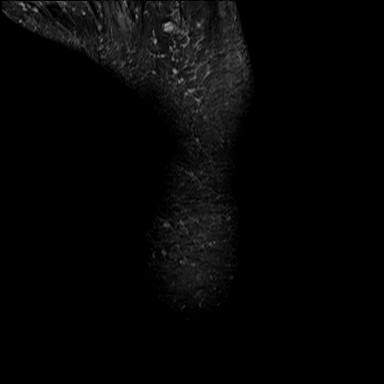
[im 7/34]
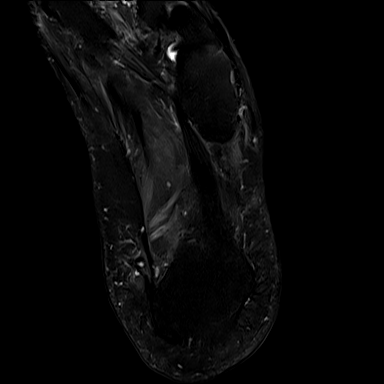
[im 14/34]
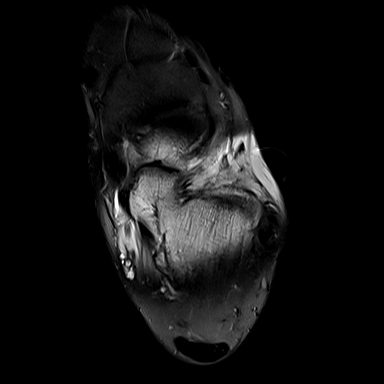
[im 20/34]
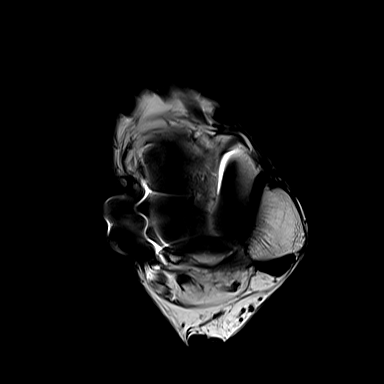
[im 27/34]
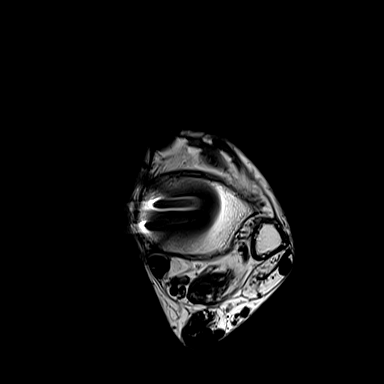
[im 34/34]
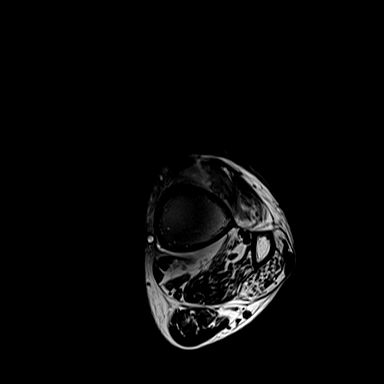

[Series 5: ax t1fs · axial · left · 4.0mm · 0.44mm/px · z∈[-85,+61]mm · 6 of 34 slices shown]
[im 1/34]
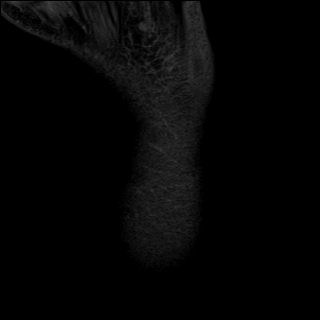
[im 7/34]
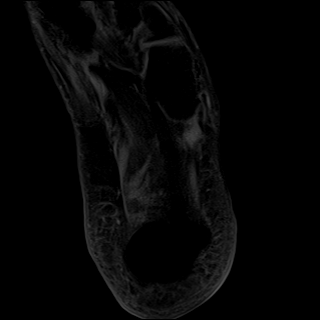
[im 14/34]
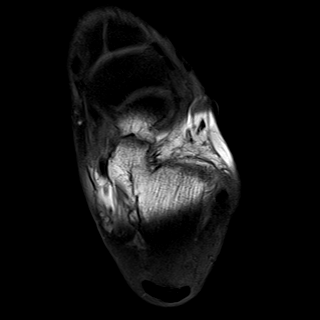
[im 20/34]
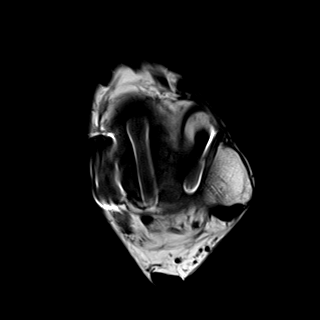
[im 27/34]
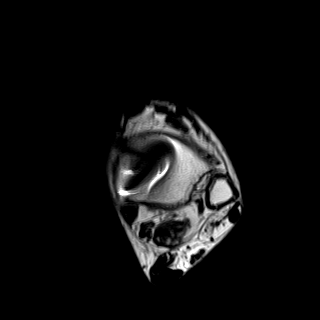
[im 34/34]
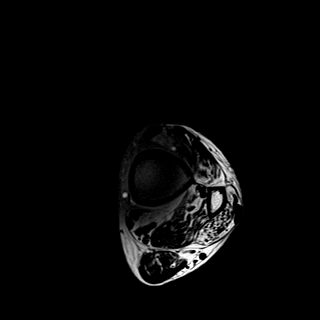

[Series 6: STIR · sagittal · left · 4.0mm · 0.31mm/px · 3 of 18 slices shown (1 of 2)]
[im 1/18]
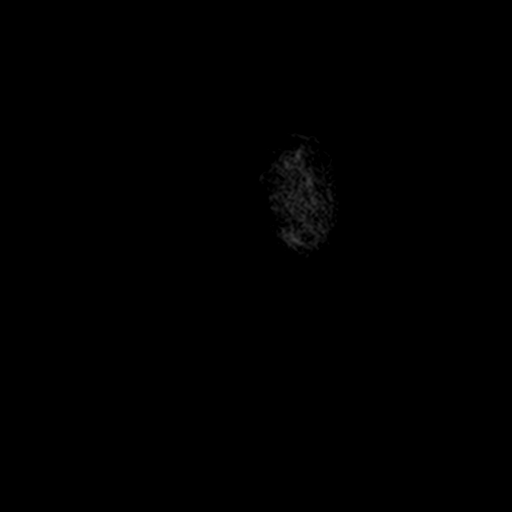
[im 9/18]
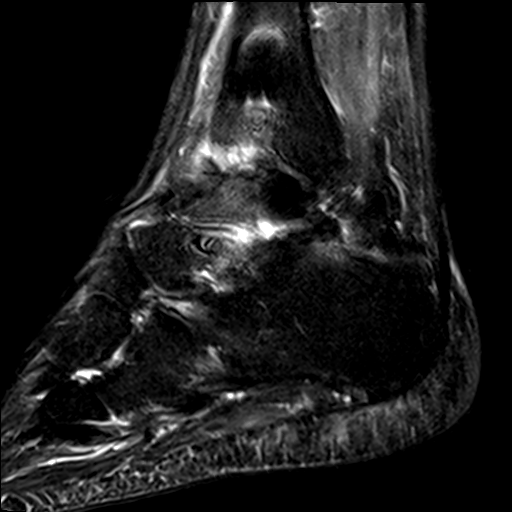
[im 18/18]
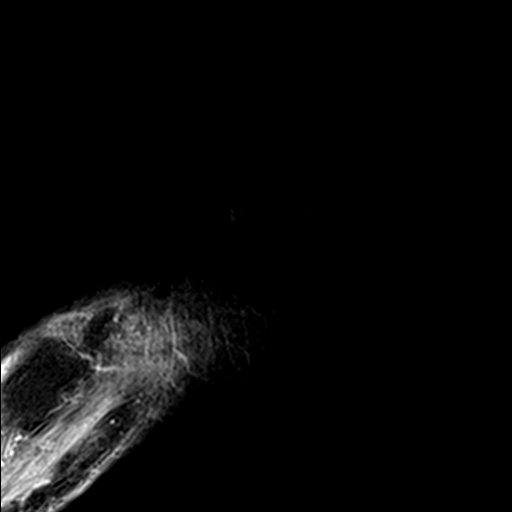

[Series 7: STIR · coronal · left · 4.0mm · 0.31mm/px · 1 of 30 slices shown (2 of 2)]
[im 1/30]
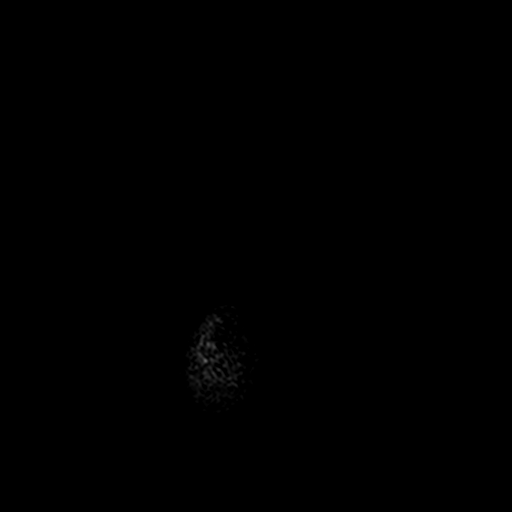

[Series 8: T1 fat-sat post-contrast · axial · left · 4.0mm · 0.44mm/px · z∈[-85,+61]mm · 6 of 34 slices shown (1 of 2)]
[im 1/34]
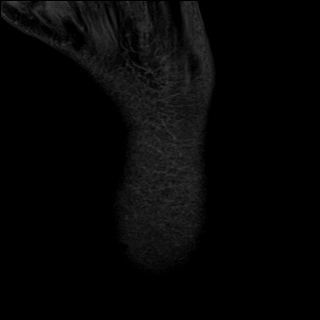
[im 7/34]
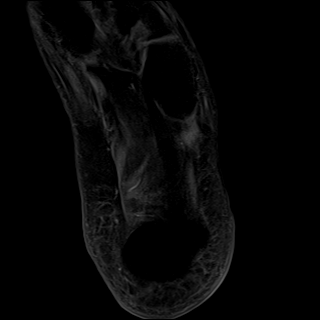
[im 14/34]
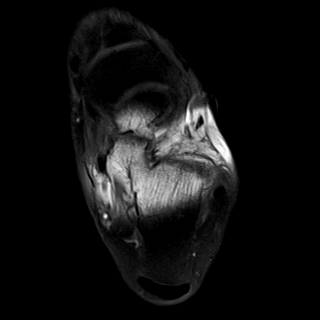
[im 20/34]
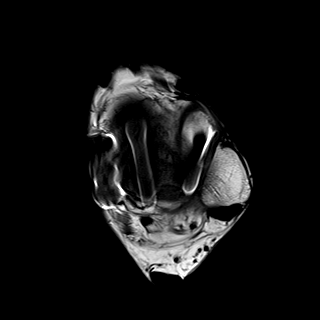
[im 27/34]
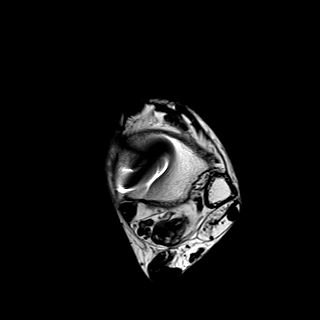
[im 34/34]
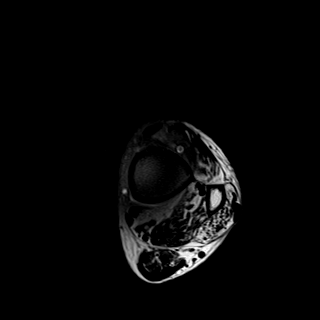

[Series 9: T1 fat-sat post-contrast · coronal · left · 4.0mm · 0.50mm/px · 6 of 30 slices shown (2 of 2)]
[im 1/30]
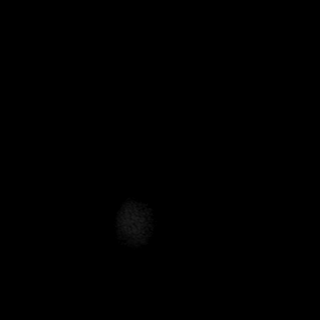
[im 6/30]
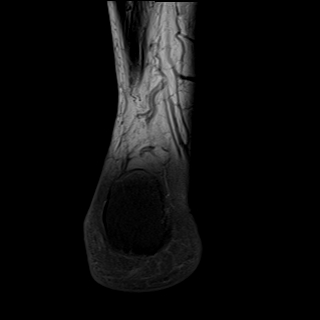
[im 12/30]
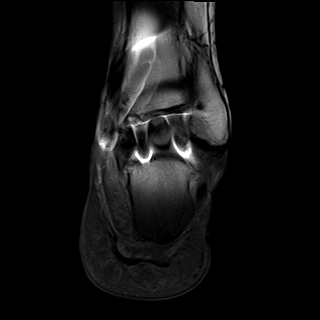
[im 18/30]
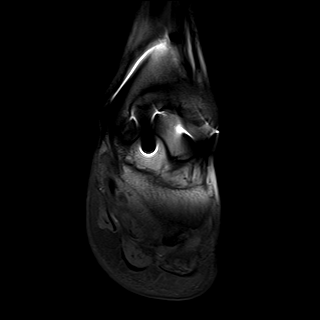
[im 24/30]
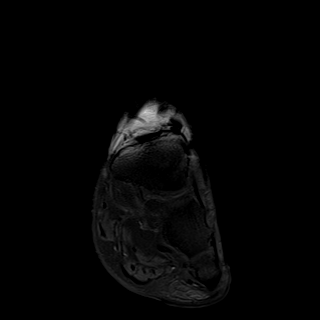
[im 30/30]
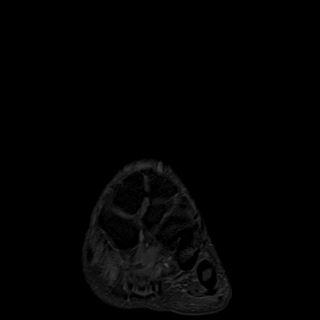

[35 of 40 positions shown; findings below may reference images not displayed]

FINDINGS: Technical note: Examination is significantly degraded by motion
artifact. There is also extensive metallic susceptibility artifact
from patient's hardware resulting in poor fat saturation.

TENDONS

Peroneal: Grossly intact peroneus longus and peroneus brevis
tendons.

Posteromedial: Grossly intact tibialis posterior, flexor hallucis
longus and flexor digitorum longus tendons.

Anterior: Intact tibialis anterior, extensor hallucis longus and
extensor digitorum longus tendons.

Achilles: Intact.

Plantar Fascia: Intact.

LIGAMENTS

Lateral: Anterior and posterior tibiofibular ligaments appear
grossly intact. Region of the anterior talofibular ligament is
partially obscured by susceptibility artifact. No definite ATFL
tear. The posterior talofibular ligament and calcaneofibular
ligaments appear grossly intact.

Medial: Deltoid ligament largely obscured. No obvious disruption of
the spring ligament complex.

CARTILAGE

Ankle Joint: Advanced arthropathy of the tibiotalar joint with
prominent anterior osteophytosis. Evaluation of the chondral
surfaces and subchondral bone is largely obscured by susceptibility.
No tibiotalar joint effusion.

Subtalar Joints/Sinus Tarsi: Degenerative changes of the posterior
subtalar joint. No subtalar joint effusion. No edema within the
sinus tarsi.

Bones: Metallic susceptibility artifact related to partially
threaded screws within the talus and medial malleolus. No evidence
of acute fracture or dislocation. Degenerative subchondral marrow
signal changes evident at the subtalar joint and partially
visualized at the tibiotalar joint. Otherwise, no focal bone marrow
signal abnormality is identified. No suspicious bone lesion.

Other: No soft tissue edema or fluid collection. No abnormal
postcontrast enhancement is evident.
IMPRESSION: IMPRESSION
1. Examination is degraded by motion artifact and extensive metallic
susceptibility.
2. Advanced arthropathy of the tibiotalar joint with prominent
anterior osteophytosis. Evaluation of the chondral surfaces and
subchondral bone is largely obscured by susceptibility artifact.
3. Degenerative changes of the posterior subtalar joint.
4. No acute osseous abnormality identified. No evidence of hardware
complication within the limitations of this exam.
5. No tendinous or ligamentous abnormality is identified.

## 2023-05-08 ENCOUNTER — Emergency Department (HOSPITAL_COMMUNITY): Payer: Self-pay

## 2023-05-08 ENCOUNTER — Inpatient Hospital Stay (HOSPITAL_COMMUNITY)
Admission: EM | Admit: 2023-05-08 | Discharge: 2023-05-12 | DRG: 336 | Disposition: A | Discharge status: Home / Self Care | Payer: Medicare Other | Attending: General Surgery | Admitting: General Surgery

## 2023-05-08 ENCOUNTER — Encounter (HOSPITAL_COMMUNITY): Payer: Self-pay | Admitting: *Deleted

## 2023-05-08 ENCOUNTER — Other Ambulatory Visit: Payer: Self-pay

## 2023-05-08 ENCOUNTER — Emergency Department (HOSPITAL_COMMUNITY): Payer: Medicare Other

## 2023-05-08 DIAGNOSIS — I10 Essential (primary) hypertension: Secondary | ICD-10-CM | POA: Diagnosis present

## 2023-05-08 DIAGNOSIS — K565 Intestinal adhesions [bands], unspecified as to partial versus complete obstruction: Secondary | ICD-10-CM | POA: Diagnosis present

## 2023-05-08 DIAGNOSIS — J069 Acute upper respiratory infection, unspecified: Secondary | ICD-10-CM | POA: Insufficient documentation

## 2023-05-08 DIAGNOSIS — Z885 Allergy status to narcotic agent status: Secondary | ICD-10-CM

## 2023-05-08 DIAGNOSIS — K4030 Unilateral inguinal hernia, with obstruction, without gangrene, not specified as recurrent: Secondary | ICD-10-CM | POA: Diagnosis not present

## 2023-05-08 DIAGNOSIS — R109 Unspecified abdominal pain: Secondary | ICD-10-CM | POA: Diagnosis not present

## 2023-05-08 DIAGNOSIS — R739 Hyperglycemia, unspecified: Secondary | ICD-10-CM | POA: Diagnosis present

## 2023-05-08 DIAGNOSIS — Z87891 Personal history of nicotine dependence: Secondary | ICD-10-CM

## 2023-05-08 DIAGNOSIS — K460 Unspecified abdominal hernia with obstruction, without gangrene: Principal | ICD-10-CM | POA: Diagnosis present

## 2023-05-08 DIAGNOSIS — E871 Hypo-osmolality and hyponatremia: Secondary | ICD-10-CM | POA: Diagnosis present

## 2023-05-08 DIAGNOSIS — K56609 Unspecified intestinal obstruction, unspecified as to partial versus complete obstruction: Secondary | ICD-10-CM

## 2023-05-08 DIAGNOSIS — R188 Other ascites: Secondary | ICD-10-CM | POA: Diagnosis present

## 2023-05-08 DIAGNOSIS — Z880 Allergy status to penicillin: Secondary | ICD-10-CM

## 2023-05-08 LAB — BASIC METABOLIC PANEL
Anion gap: 13 (ref 5–15)
BUN: 15 mg/dL (ref 8–23)
CO2: 22 mmol/L (ref 22–32)
Calcium: 9.7 mg/dL (ref 8.9–10.3)
Chloride: 97 mmol/L — ABNORMAL LOW (ref 98–111)
Creatinine, Ser: 1.16 mg/dL (ref 0.61–1.24)
GFR, Estimated: >60 mL/min (ref >60)
Glucose, Bld: 182 mg/dL — ABNORMAL HIGH (ref 70–99)
Potassium: 4.4 mmol/L (ref 3.5–5.1)
Sodium: 132 mmol/L — ABNORMAL LOW (ref 135–145)

## 2023-05-08 LAB — CBC
HCT: 41.4 % (ref 39.0–52.0)
Hemoglobin: 14 g/dL (ref 13.0–17.0)
MCH: 32.4 pg (ref 26.0–34.0)
MCHC: 33.8 g/dL (ref 30.0–36.0)
MCV: 95.8 fL (ref 80.0–100.0)
Platelets: 208 10*3/uL (ref 150–400)
RBC: 4.32 MIL/uL (ref 4.22–5.81)
RDW: 13.2 % (ref 11.5–15.5)
WBC: 8.8 10*3/uL (ref 4.0–10.5)
nRBC: 0 % (ref 0.0–0.2)

## 2023-05-08 MED ORDER — LACTATED RINGERS IV BOLUS
1000.0000 mL | Freq: Once | INTRAVENOUS | Status: AC
  Administered 2023-05-08: 1000 mL via INTRAVENOUS

## 2023-05-08 MED ORDER — IOHEXOL 300 MG/ML  SOLN
100.0000 mL | Freq: Once | INTRAMUSCULAR | Status: AC | PRN
Start: 2023-05-08 — End: 2023-05-08
  Administered 2023-05-08: 100 mL via INTRAVENOUS

## 2023-05-08 MED ORDER — ONDANSETRON HCL 4 MG/2ML IJ SOLN
4.0000 mg | Freq: Once | INTRAMUSCULAR | Status: AC
  Administered 2023-05-08: 4 mg via INTRAVENOUS
  Filled 2023-05-08: qty 2

## 2023-05-08 MED ORDER — MORPHINE SULFATE (PF) 4 MG/ML IV SOLN
4.0000 mg | Freq: Once | INTRAVENOUS | Status: AC
  Administered 2023-05-08: 4 mg via INTRAVENOUS
  Filled 2023-05-08: qty 1
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
## 2023-05-08 NOTE — ED Triage Notes (Signed)
Pt c/o swelling to testicles that started x 2 days ago  Pt had an episode of vomiting while in waiting room

## 2023-05-08 NOTE — Progress Notes (Addendum)
[facility]  Reviewed imaging extensively. SBO with incarcerated R inguinal hernia with a long segment of small bowel in the hernia. Bowel is fluid and some air in the lumen. There is a loop of bowel with air along the wall but this is superior and no where is it dependent (not on the underside) and more than likely represents normal air in the lumen of the bowel and not pneumatosis. There is no dependent wall with this appearance. This is best appreciated on the axial views. Lab work reassuring and this patient has had this hernia incarcerated for several days. Vomited in the ED.  Needs NG, if cannot get NG please let me know NPO Will plan for OR tomorrow for hernia repair Hospitalist admission, can take over tomorrow given limited medical issues  Appreciate assistance.  Any changes let me know   Algis Greenhouse, MD [facility] [address] [place], [place] [address] [phone] (office)     .

## 2023-05-09 ENCOUNTER — Inpatient Hospital Stay (HOSPITAL_COMMUNITY): Payer: Medicare Other | Admitting: Certified Registered"

## 2023-05-09 ENCOUNTER — Inpatient Hospital Stay (HOSPITAL_COMMUNITY): Payer: Medicare Other

## 2023-05-09 ENCOUNTER — Other Ambulatory Visit: Payer: Self-pay

## 2023-05-09 ENCOUNTER — Encounter (HOSPITAL_COMMUNITY): Payer: Self-pay | Admitting: Internal Medicine

## 2023-05-09 ENCOUNTER — Encounter (HOSPITAL_COMMUNITY)
Admission: EM | Disposition: A | Discharge status: Home / Self Care | Payer: Self-pay | Source: Home / Self Care | Attending: General Surgery

## 2023-05-09 DIAGNOSIS — R739 Hyperglycemia, unspecified: Secondary | ICD-10-CM | POA: Diagnosis present

## 2023-05-09 DIAGNOSIS — K4030 Unilateral inguinal hernia, with obstruction, without gangrene, not specified as recurrent: Secondary | ICD-10-CM

## 2023-05-09 DIAGNOSIS — J069 Acute upper respiratory infection, unspecified: Secondary | ICD-10-CM | POA: Diagnosis present

## 2023-05-09 DIAGNOSIS — K56609 Unspecified intestinal obstruction, unspecified as to partial versus complete obstruction: Secondary | ICD-10-CM | POA: Diagnosis not present

## 2023-05-09 DIAGNOSIS — K460 Unspecified abdominal hernia with obstruction, without gangrene: Principal | ICD-10-CM

## 2023-05-09 DIAGNOSIS — Z885 Allergy status to narcotic agent status: Secondary | ICD-10-CM | POA: Diagnosis not present

## 2023-05-09 DIAGNOSIS — I10 Essential (primary) hypertension: Secondary | ICD-10-CM | POA: Diagnosis present

## 2023-05-09 DIAGNOSIS — R109 Unspecified abdominal pain: Secondary | ICD-10-CM | POA: Diagnosis present

## 2023-05-09 DIAGNOSIS — Z880 Allergy status to penicillin: Secondary | ICD-10-CM | POA: Diagnosis not present

## 2023-05-09 DIAGNOSIS — Z87891 Personal history of nicotine dependence: Secondary | ICD-10-CM | POA: Diagnosis not present

## 2023-05-09 DIAGNOSIS — R188 Other ascites: Secondary | ICD-10-CM | POA: Diagnosis present

## 2023-05-09 DIAGNOSIS — E871 Hypo-osmolality and hyponatremia: Secondary | ICD-10-CM | POA: Diagnosis present

## 2023-05-09 DIAGNOSIS — K565 Intestinal adhesions [bands], unspecified as to partial versus complete obstruction: Secondary | ICD-10-CM | POA: Diagnosis present

## 2023-05-09 HISTORY — PX: LYSIS OF ADHESION: SHX5961

## 2023-05-09 HISTORY — PX: INGUINAL HERNIA REPAIR: SHX194

## 2023-05-09 LAB — CBC
HCT: 38.3 % — ABNORMAL LOW (ref 39.0–52.0)
Hemoglobin: 13.1 g/dL (ref 13.0–17.0)
MCH: 32.6 pg (ref 26.0–34.0)
MCHC: 34.2 g/dL (ref 30.0–36.0)
MCV: 95.3 fL (ref 80.0–100.0)
Platelets: 178 10*3/uL (ref 150–400)
RBC: 4.02 MIL/uL — ABNORMAL LOW (ref 4.22–5.81)
RDW: 13.2 % (ref 11.5–15.5)
WBC: 8.9 10*3/uL (ref 4.0–10.5)
nRBC: 0 % (ref 0.0–0.2)

## 2023-05-09 LAB — GLUCOSE, CAPILLARY
Glucose-Capillary: 136 mg/dL — ABNORMAL HIGH (ref 70–99)
Glucose-Capillary: 135 mg/dL — ABNORMAL HIGH (ref 70–99)
Glucose-Capillary: 163 mg/dL — ABNORMAL HIGH (ref 70–99)
Glucose-Capillary: 146 mg/dL — ABNORMAL HIGH (ref 70–99)
Glucose-Capillary: 154 mg/dL — ABNORMAL HIGH (ref 70–99)

## 2023-05-09 LAB — HEMOGLOBIN A1C
Hgb A1c MFr Bld: 5.8 % — ABNORMAL HIGH (ref 4.8–5.6)
Mean Plasma Glucose: 119.76 mg/dL

## 2023-05-09 LAB — BASIC METABOLIC PANEL
Anion gap: 11 (ref 5–15)
BUN: 15 mg/dL (ref 8–23)
CO2: 23 mmol/L (ref 22–32)
Calcium: 9.1 mg/dL (ref 8.9–10.3)
Chloride: 98 mmol/L (ref 98–111)
Creatinine, Ser: 1.02 mg/dL (ref 0.61–1.24)
GFR, Estimated: >60 mL/min (ref >60)
Glucose, Bld: 141 mg/dL — ABNORMAL HIGH (ref 70–99)
Potassium: 4.4 mmol/L (ref 3.5–5.1)
Sodium: 132 mmol/L — ABNORMAL LOW (ref 135–145)

## 2023-05-09 LAB — PHOSPHORUS: Phosphorus: 3.8 mg/dL (ref 2.5–4.6)

## 2023-05-09 LAB — MAGNESIUM: Magnesium: 2.1 mg/dL (ref 1.7–2.4)

## 2023-05-09 SURGERY — REPAIR, HERNIA, INGUINAL, INCARCERATED
Anesthesia: General | Site: Inguinal | Laterality: Right

## 2023-05-09 MED ORDER — PHENYLEPHRINE 80 MCG/ML (10ML) SYRINGE FOR IV PUSH (FOR BLOOD PRESSURE SUPPORT)
PREFILLED_SYRINGE | INTRAVENOUS | Status: DC | PRN
  Administered 2023-05-09 (×2): 80 ug via INTRAVENOUS
  Administered 2023-05-09 (×2): 160 ug via INTRAVENOUS
  Administered 2023-05-09 (×2): 80 ug via INTRAVENOUS
  Administered 2023-05-09: 160 ug via INTRAVENOUS
  Administered 2023-05-09: 80 ug via INTRAVENOUS

## 2023-05-09 MED ORDER — ONDANSETRON HCL 4 MG/2ML IJ SOLN
INTRAMUSCULAR | Status: DC | PRN
  Administered 2023-05-09: 4 mg via INTRAVENOUS

## 2023-05-09 MED ORDER — DEXAMETHASONE SODIUM PHOSPHATE 10 MG/ML IJ SOLN
INTRAMUSCULAR | Status: DC | PRN
  Administered 2023-05-09: 6 mg via INTRAVENOUS

## 2023-05-09 MED ORDER — VASOPRESSIN 20 UNIT/ML IV SOLN
INTRAVENOUS | Status: DC | PRN
  Administered 2023-05-09: 1 [IU] via INTRAVENOUS

## 2023-05-09 MED ORDER — ROCURONIUM BROMIDE 10 MG/ML (PF) SYRINGE
PREFILLED_SYRINGE | INTRAVENOUS | Status: AC
Start: 2023-05-09 — End: ?
  Filled 2023-05-09: qty 10

## 2023-05-09 MED ORDER — HYDROMORPHONE HCL 1 MG/ML IJ SOLN
INTRAMUSCULAR | Status: AC
  Filled 2023-05-09: qty 1

## 2023-05-09 MED ORDER — DEXAMETHASONE SODIUM PHOSPHATE 10 MG/ML IJ SOLN
INTRAMUSCULAR | Status: AC
  Filled 2023-05-09: qty 1

## 2023-05-09 MED ORDER — PHENYLEPHRINE 80 MCG/ML (10ML) SYRINGE FOR IV PUSH (FOR BLOOD PRESSURE SUPPORT)
PREFILLED_SYRINGE | INTRAVENOUS | Status: AC
  Filled 2023-05-09: qty 10

## 2023-05-09 MED ORDER — ALBUMIN HUMAN 5 % IV SOLN: INTRAVENOUS | Status: DC | PRN

## 2023-05-09 MED ORDER — OXYCODONE HCL 5 MG PO TABS: 5.0000 mg | ORAL_TABLET | Freq: Once | ORAL | Status: DC | PRN

## 2023-05-09 MED ORDER — SUGAMMADEX SODIUM 200 MG/2ML IV SOLN
INTRAVENOUS | Status: DC | PRN
  Administered 2023-05-09: 200 mg via INTRAVENOUS

## 2023-05-09 MED ORDER — BUPIVACAINE HCL (PF) 0.5 % IJ SOLN
INTRAMUSCULAR | Status: DC | PRN
  Administered 2023-05-09: 30 mL

## 2023-05-09 MED ORDER — FENTANYL CITRATE (PF) 100 MCG/2ML IJ SOLN
INTRAMUSCULAR | Status: DC | PRN
  Administered 2023-05-09 (×2): 50 ug via INTRAVENOUS

## 2023-05-09 MED ORDER — PHENYLEPHRINE HCL-NACL 20-0.9 MG/250ML-% IV SOLN
INTRAVENOUS | Status: DC | PRN
  Administered 2023-05-09: 40 ug/min via INTRAVENOUS

## 2023-05-09 MED ORDER — SODIUM CHLORIDE 0.9 % IV SOLN
2.0000 g | Freq: Two times a day (BID) | INTRAVENOUS | Status: AC
  Administered 2023-05-09 – 2023-05-10 (×2): 2 g via INTRAVENOUS
  Filled 2023-05-09 (×2): qty 2

## 2023-05-09 MED ORDER — CHLORHEXIDINE GLUCONATE 0.12 % MT SOLN: 15.0000 mL | Freq: Once | OROMUCOSAL | Status: AC

## 2023-05-09 MED ORDER — BUPIVACAINE HCL (PF) 0.5 % IJ SOLN
INTRAMUSCULAR | Status: AC
  Filled 2023-05-09: qty 30

## 2023-05-09 MED ORDER — DIPHENHYDRAMINE HCL 50 MG/ML IJ SOLN: 12.5000 mg | Freq: Four times a day (QID) | INTRAMUSCULAR | Status: DC | PRN

## 2023-05-09 MED ORDER — ONDANSETRON HCL 4 MG/2ML IJ SOLN: 4.0000 mg | Freq: Once | INTRAMUSCULAR | Status: DC | PRN

## 2023-05-09 MED ORDER — MIDAZOLAM HCL 2 MG/2ML IJ SOLN
INTRAMUSCULAR | Status: AC
  Filled 2023-05-09: qty 2

## 2023-05-09 MED ORDER — LIDOCAINE HCL (CARDIAC) PF 100 MG/5ML IV SOSY
PREFILLED_SYRINGE | INTRAVENOUS | Status: DC | PRN
  Administered 2023-05-09: 60 mg via INTRAVENOUS

## 2023-05-09 MED ORDER — INSULIN ASPART 100 UNIT/ML IJ SOLN
0.0000 [IU] | INTRAMUSCULAR | Status: DC
  Administered 2023-05-09: 2 [IU] via SUBCUTANEOUS
  Administered 2023-05-10 (×3): 1 [IU] via SUBCUTANEOUS

## 2023-05-09 MED ORDER — HYDROMORPHONE HCL 1 MG/ML IJ SOLN: 0.2500 mg | INTRAMUSCULAR | Status: DC | PRN

## 2023-05-09 MED ORDER — PROCHLORPERAZINE EDISYLATE 10 MG/2ML IJ SOLN
5.0000 mg | Freq: Four times a day (QID) | INTRAMUSCULAR | Status: DC | PRN
  Administered 2023-05-09: 5 mg via INTRAVENOUS
  Filled 2023-05-09: qty 2

## 2023-05-09 MED ORDER — ACETAMINOPHEN 650 MG RE SUPP: 650.0000 mg | Freq: Four times a day (QID) | RECTAL | Status: DC | PRN

## 2023-05-09 MED ORDER — SODIUM CHLORIDE 0.9 % IV SOLN
2.0000 g | INTRAVENOUS | Status: AC
  Administered 2023-05-09: 2 g via INTRAVENOUS
  Filled 2023-05-09: qty 2

## 2023-05-09 MED ORDER — HYDROMORPHONE HCL 1 MG/ML IJ SOLN
0.5000 mg | INTRAMUSCULAR | Status: DC | PRN
  Filled 2023-05-09: qty 0.5

## 2023-05-09 MED ORDER — ONDANSETRON HCL 4 MG/2ML IJ SOLN
INTRAMUSCULAR | Status: AC
  Filled 2023-05-09: qty 2

## 2023-05-09 MED ORDER — SIMETHICONE 80 MG PO CHEW
40.0000 mg | CHEWABLE_TABLET | Freq: Four times a day (QID) | ORAL | Status: DC | PRN
Start: 2023-05-09 — End: 2023-05-12

## 2023-05-09 MED ORDER — SODIUM CHLORIDE 0.9 % IV SOLN: INTRAVENOUS | Status: AC

## 2023-05-09 MED ORDER — GUAIFENESIN ER 600 MG PO TB12
1200.0000 mg | ORAL_TABLET | Freq: Two times a day (BID) | ORAL | Status: DC
  Administered 2023-05-09 – 2023-05-12 (×6): 1200 mg via ORAL
  Filled 2023-05-09 (×6): qty 2

## 2023-05-09 MED ORDER — LACTATED RINGERS IV SOLN: INTRAVENOUS | Status: DC

## 2023-05-09 MED ORDER — ROCURONIUM BROMIDE 10 MG/ML (PF) SYRINGE
PREFILLED_SYRINGE | INTRAVENOUS | Status: DC | PRN
  Administered 2023-05-09: 70 mg via INTRAVENOUS
  Administered 2023-05-09: 10 mg via INTRAVENOUS
  Administered 2023-05-09: 50 mg via INTRAVENOUS

## 2023-05-09 MED ORDER — FENTANYL CITRATE (PF) 100 MCG/2ML IJ SOLN
INTRAMUSCULAR | Status: AC
  Filled 2023-05-09: qty 2

## 2023-05-09 MED ORDER — MIDAZOLAM HCL 2 MG/2ML IJ SOLN
INTRAMUSCULAR | Status: DC | PRN
  Administered 2023-05-09: 2 mg via INTRAVENOUS

## 2023-05-09 MED ORDER — ALBUMIN HUMAN 5 % IV SOLN
INTRAVENOUS | Status: AC
  Filled 2023-05-09: qty 250

## 2023-05-09 MED ORDER — OXYCODONE HCL 5 MG/5ML PO SOLN: 5.0000 mg | Freq: Once | ORAL | Status: DC | PRN

## 2023-05-09 MED ORDER — HYDROMORPHONE HCL 1 MG/ML IJ SOLN
0.5000 mg | INTRAMUSCULAR | Status: DC | PRN
  Filled 2023-05-09: qty 1

## 2023-05-09 MED ORDER — DIPHENHYDRAMINE HCL 12.5 MG/5ML PO ELIX: 12.5000 mg | ORAL_SOLUTION | Freq: Four times a day (QID) | ORAL | Status: DC | PRN

## 2023-05-09 MED ORDER — ZOLPIDEM TARTRATE 5 MG PO TABS
5.0000 mg | ORAL_TABLET | Freq: Every evening | ORAL | Status: DC | PRN
  Administered 2023-05-10 – 2023-05-11 (×2): 5 mg via ORAL
  Filled 2023-05-09 (×2): qty 1

## 2023-05-09 MED ORDER — VASOPRESSIN 20 UNIT/ML IV SOLN
INTRAVENOUS | Status: AC
  Filled 2023-05-09: qty 1

## 2023-05-09 MED ORDER — CHLORHEXIDINE GLUCONATE 4 % EX SOLN: 1.0000 | Freq: Once | CUTANEOUS | Status: DC

## 2023-05-09 MED ORDER — SODIUM CHLORIDE 0.9 % IR SOLN
Status: DC | PRN
  Administered 2023-05-09: 1000 mL

## 2023-05-09 MED ORDER — PROPOFOL 10 MG/ML IV BOLUS
INTRAVENOUS | Status: AC
Start: 2023-05-09 — End: ?
  Filled 2023-05-09: qty 20

## 2023-05-09 MED ORDER — CHLORHEXIDINE GLUCONATE 0.12 % MT SOLN
OROMUCOSAL | Status: AC
  Administered 2023-05-09: 15 mL via OROMUCOSAL
  Filled 2023-05-09: qty 15

## 2023-05-09 MED ORDER — LIDOCAINE HCL (PF) 2 % IJ SOLN
INTRAMUSCULAR | Status: AC
  Filled 2023-05-09: qty 5

## 2023-05-09 MED ORDER — DOCUSATE SODIUM 100 MG PO CAPS
100.0000 mg | ORAL_CAPSULE | Freq: Two times a day (BID) | ORAL | Status: DC
  Administered 2023-05-09: 100 mg via ORAL
  Filled 2023-05-09 (×4): qty 1

## 2023-05-09 MED ORDER — SUCCINYLCHOLINE CHLORIDE 200 MG/10ML IV SOSY
PREFILLED_SYRINGE | INTRAVENOUS | Status: DC | PRN
  Administered 2023-05-09: 100 mg via INTRAVENOUS

## 2023-05-09 MED ORDER — ONDANSETRON HCL 4 MG/2ML IJ SOLN: 4.0000 mg | Freq: Four times a day (QID) | INTRAMUSCULAR | Status: DC | PRN

## 2023-05-09 MED ORDER — ACETAMINOPHEN 325 MG PO TABS
650.0000 mg | ORAL_TABLET | Freq: Four times a day (QID) | ORAL | Status: DC | PRN
  Administered 2023-05-09 – 2023-05-11 (×2): 650 mg via ORAL
  Filled 2023-05-09 (×3): qty 2

## 2023-05-09 MED ORDER — ROCURONIUM BROMIDE 10 MG/ML (PF) SYRINGE
PREFILLED_SYRINGE | INTRAVENOUS | Status: AC
  Filled 2023-05-09: qty 10

## 2023-05-09 MED ORDER — PROCHLORPERAZINE EDISYLATE 10 MG/2ML IJ SOLN
5.0000 mg | Freq: Four times a day (QID) | INTRAMUSCULAR | Status: DC | PRN
  Filled 2023-05-09: qty 1

## 2023-05-09 MED ORDER — ORAL CARE MOUTH RINSE: 15.0000 mL | Freq: Once | OROMUCOSAL | Status: AC

## 2023-05-09 MED ORDER — METHOCARBAMOL 500 MG PO TABS
500.0000 mg | ORAL_TABLET | Freq: Four times a day (QID) | ORAL | Status: DC | PRN
  Administered 2023-05-11: 500 mg via ORAL
  Filled 2023-05-09: qty 1

## 2023-05-09 MED ORDER — LABETALOL HCL 5 MG/ML IV SOLN: 5.0000 mg | INTRAVENOUS | Status: DC | PRN

## 2023-05-09 MED ORDER — PROPOFOL 10 MG/ML IV BOLUS
INTRAVENOUS | Status: DC | PRN
  Administered 2023-05-09: 150 mg via INTRAVENOUS

## 2023-05-09 MED ORDER — PANTOPRAZOLE SODIUM 40 MG IV SOLR
40.0000 mg | Freq: Every day | INTRAVENOUS | Status: DC
  Administered 2023-05-09 – 2023-05-11 (×3): 40 mg via INTRAVENOUS
  Filled 2023-05-09 (×3): qty 10

## 2023-05-09 SURGICAL SUPPLY — 36 items
BLADE SURG SZ10 CARB STEEL (BLADE) IMPLANT
CLOTH BEACON ORANGE TIMEOUT ST (SAFETY) ×2 IMPLANT
COVER LIGHT HANDLE STERIS (MISCELLANEOUS) ×4 IMPLANT
DERMABOND ADVANCED .7 DNX12 (GAUZE/BANDAGES/DRESSINGS) ×2 IMPLANT
DRAIN PENROSE 0.5X18 (DRAIN) ×2 IMPLANT
ELECT REM PT RETURN 9FT ADLT (ELECTROSURGICAL) ×2
ELECTRODE REM PT RTRN 9FT ADLT (ELECTROSURGICAL) ×2 IMPLANT
EVACUATOR DRAINAGE 10X20 100CC (DRAIN) IMPLANT
GAUZE SPONGE 4X4 12PLY STRL (GAUZE/BANDAGES/DRESSINGS) ×2 IMPLANT
GLOVE BIO SURGEON STRL SZ 6.5 (GLOVE) ×2 IMPLANT
GLOVE BIOGEL PI IND STRL 6.5 (GLOVE) ×2 IMPLANT
GLOVE BIOGEL PI IND STRL 7.0 (GLOVE) ×4 IMPLANT
GOWN STRL REUS W/TWL LRG LVL3 (GOWN DISPOSABLE) ×6 IMPLANT
GOWN STRL REUS W/TWL XL LVL3 (GOWN DISPOSABLE) IMPLANT
KIT TURNOVER KIT A (KITS) ×2 IMPLANT
LIGASURE IMPACT 36 18CM CVD LR (INSTRUMENTS) IMPLANT
MANIFOLD NEPTUNE II (INSTRUMENTS) ×2 IMPLANT
MESH HERNIA 1.6X1.9 PLUG LRG (Mesh General) IMPLANT
MESH MARLEX PLUG MEDIUM (Mesh General) IMPLANT
NDL HYPO 21X1.5 SAFETY (NEEDLE) IMPLANT
NEEDLE HYPO 21X1.5 SAFETY (NEEDLE) ×2 IMPLANT
NS IRRIG 1000ML POUR BTL (IV SOLUTION) ×2 IMPLANT
PACK MINOR (CUSTOM PROCEDURE TRAY) ×2 IMPLANT
PAD ARMBOARD 7.5X6 YLW CONV (MISCELLANEOUS) ×2 IMPLANT
POSITIONER HEAD 8X9X4 ADT (SOFTGOODS) ×2 IMPLANT
SET BASIN LINEN APH (SET/KITS/TRAYS/PACK) ×2 IMPLANT
SOL PREP PROV IODINE SCRUB 4OZ (MISCELLANEOUS) ×2 IMPLANT
SPONGE DRAIN TRACH 4X4 STRL 2S (GAUZE/BANDAGES/DRESSINGS) IMPLANT
SPONGE T-LAP 18X18 ~~LOC~~+RFID (SPONGE) IMPLANT
SUT ETHILON 3 0 FSL (SUTURE) IMPLANT
SUT MNCRL AB 4-0 PS2 18 (SUTURE) ×2 IMPLANT
SUT NOVA NAB GS-22 2 2-0 T-19 (SUTURE) IMPLANT
SUT SILK 3 0 SH CR/8 (SUTURE) IMPLANT
SUT VIC AB 2-0 CT1 TAPERPNT 27 (SUTURE) ×2 IMPLANT
SUT VIC AB 3-0 SH 27X BRD (SUTURE) ×2 IMPLANT
SYR 30ML LL (SYRINGE) ×2 IMPLANT

## 2023-05-09 NOTE — Op Note (Addendum)
[facility] Operative Note  05/09/23  Preoperative Diagnosis: Incarcerated right inguinal hernia, small bowel obstruction    Postoperative Diagnosis: Same   Procedure(s) Performed: Right inguinal hernia repair with mesh, lysis of adhesions, JP drain placement    Surgeon: Leatrice Jewels. Henreitta Leber, MD   Assistants: No qualified resident was available   Anesthesia: General endotracheal   Anesthesiologist: Windell Norfolk, MD    Specimens: None   Estimated Blood Loss: Minimal   Blood Replacement: None    Complications: None   Wound Class: Clean   Operative Indications: Mr. Zappa is a 67 yo with an incarcerated hernia with SBO. We discussed repair and potential need for small bowel resection, risk of bleeding, infection, recurrence, use of mesh versus primary repair, injury to testicle or cord structures.   Findings: Small bowel healthy and viable, omental band wrapped around small bowel entering hernia causing the obstruction    Procedure: The patient was taken to the operating room and placed supine. General endotracheal anesthesia was induced. Intravenous antibiotics were  administered per protocol.  A time out was preformed verifying the correct patient, procedure, site, positioning and implants. An NG was already in place. A foley was placed.  The abdomen and groin including the penis and scrotum were prepared and draped in the usual sterile fashion.   An incision was made in a natural skin crease between the pubic tubercle and the anterior superior iliac spine.  The incision was deepened with electrocautery through Scarpa's and Camper's fascia until the aponeurosis of the external oblique was encountered.  This was cleaned and the external ring was exposed.  An incision was made in the midportion of the external oblique aponeurosis in the direction of its fibers. The ilioinguinal nerve was not identified.  Flaps of the external oblique were developed cephalad and  inferiorly.    The cord was identified and it was gently dissented free at the pubic tubercle and encircled with a Penrose drain.  Attention was then directed at the anteromedial aspect of the cord, where an indirect hernia sac was identified. The small bowel had reduced itself during this manipulation.  The sac was carefully dissected free from the cord down to the level of the internal ring.  The vas and testicular vessels were identified and protected from harm. Once the sac was dissected free from the cords, the Penrose was placed around the cord which was retracted inferiorly out of the field of view.   I opened the sac and noted some free ascites fluid. The small bowel was identified that was large and dilated. This was viable. There was a band of omentum that was tethering the distal point. This was taken with a ligasure to free up the small bowel. I then was able to eviscerate more of the proximal and distal small bowel and noted that this was all healthy. Where the adhesion was there was an area of slight narrowing of the small intestine. There was some additional adhesions around the intestine to its own mesentery and I released this with scissors. This freed up the area and  there was no signs of any narrowed area or area that needed to be resected. At this area there was what appeared to be some superficial  tissue but I wanted to make sure no serosal injury had occurred. To be safe I oversewed the area with 3-0 Silk lembert suture to cover that area.  The small bowel was placed back into the abdomen. The hernia sac  was suture ligated with 2-0 Vircryl suture twice, ensuring that no peritoneal structures were injured. The hernia was reduced into the internal ring without difficulty.  Two Large Perfix Plug was placed into the defect and filled the space, these were secured together and at the internal ring with 2-0 Novafil suture.   Attention was then turned to the floor of the canal, which was grossly  weakened without any defined defect or sac.  The Perfix Mesh Patch was sutured to the inguinal ligament inferiorly starting at the pubic tubercle using 2-0 Novafil interrupted sutures.  The mesh was sutured superiorly to the conjoint tendon using 2-0 Novafil interrupted sutures.  Care was taken to ensure the mesh was placed in a relaxed fashion to avoid excessive tension and no neurovascular structures were caught in the repair.  Laterally the tails of the mesh were crossed and the internal ring was recreated, allowing for passage of cords without tension.   Hemostasis was adequate.  The Penrose was removed.  The external oblique aponeurosis was closed with a 2-0 Vicryl suture in a running fashion, taking care to not catch the ilioinguinal nerve in the suture line. The testicle was pulled back into the scrotum and given the large sac size, I placed JP drain into the scrotum over top the testicle and brought this out of a stab incision on the suprapubic midline to help with seroma/ hematoma formation. It was secured with a 3-0 Nylon suture.   Scarpa's fashion was closed with 3-0 Vicryl interrupted sutures. A block of marcaine was performed.  The skin was closed with a subcuticular 4-0 Monocryl suture.  Dermabond was applied.   The patient tolerated the procedure well and was taken to the PACU in stable condition. All counts were correct at the end of the case.     The foley was removed but the NG was left in place due to the obstruction.    Algis Greenhouse, MD [facility] [address] [place], [place] [address] [phone] (office)

## 2023-05-09 NOTE — Discharge Instructions (Addendum)
Discharge Open Hernia Surgery Instructions:  JP DRAIN CARE Please keep the drain clean and dry. Replace the gauze/ tape around the drain if it gets dirty or wet/ saturated. Please do not mess with or cut the stitch that is keeping the drain in place. Secure the drain to your clothes so that it does not get dislodged.  You may want to wear a binder around your abdomen (girdle) at night for sleeping if you are worried about it getting pulled out.  Please record the output from the drain daily including the color and the amount in milliliters.  Please keep the drain covered with plastic and tape when you shower so that it does not get wet.    Common Complaints: Pain at the incision site is common. This will improve with time. Take your pain medications as described below. Some nausea is common and poor appetite. The main goal is to stay hydrated the first few days after surgery.   Diet/ Activity: Diet as tolerated. You have started and tolerated a diet in the hospital, and should continue to increase what you are able to eat.   You may not have a large appetite, but it is important to stay hydrated. Drink 64 ounces of water a day. Your appetite will return with time.  Keep a dry dressing in place over your staples daily or as needed. Some minor pink/ blood tinged drainage is expected. This will stop in a few days after surgery.  Shower per your regular routine daily.  Do not take hot showers. Take warm showers that are less than 10 minutes. Pat the incision dry. Rest and listen to your body, but do not remain in bed all day.  Walk everyday for at least 15-20 minutes. Deep cough and move around every 1-2 hours in the first few days after surgery.  Do not lift > 10 lbs, perform excessive bending, pushing, pulling, squatting for 6-8 weeks after surgery.  The activity restrictions and the abdominal binder are to prevent hernia formation at your incision while you are healing.  Do not place  lotions or balms on your incision unless instructed to specifically by Dr. Henreitta Leber.   Pain Expectations and Narcotics: -After surgery you will have pain associated with your incisions and this is normal. The pain is muscular and nerve pain, and will get better with time. -You are encouraged and expected to take non narcotic medications like tylenol and ibuprofen (when able) to treat pain as multiple modalities can aid with pain treatment. -Narcotics are only used when pain is severe or there is breakthrough pain. -You are not expected to have a pain score of 0 after surgery, as we cannot prevent pain. A pain score of 3-4 that allows you to be functional, move, walk, and tolerate some activity is the goal. The pain will continue to improve over the days after surgery and is dependent on your surgery. -Due to [place] law, we are only able to give a certain amount of pain medication to treat post operative pain, and we only give additional narcotics on a patient by patient basis.  -For most laparoscopic surgery, studies have shown that the majority of patients only need 10-15 narcotic pills, and for open surgeries most patients only need 15-20.   -Having appropriate expectations of pain and knowledge of pain management with non narcotics is important as we do not want anyone to become addicted to narcotic pain medication.  -Using ice packs in the first 48 hours and  heating pads after 48 hours, wearing an abdominal binder (when recommended), and using over the counter medications are all ways to help with pain management.   -Simple acts like meditation and mindfulness practices after surgery can also help with pain control and research has proven the benefit of these practices.  Medication: Take tylenol and ibuprofen as needed for pain control, alternating every 4-6 hours.  Example:  Tylenol 1000mg  @ 6am, 12noon, 6pm, (Do not exceed 4000mg  of tylenol a day). Ibuprofen 800mg  @ 9am, 3pm, 9pm, 3am (Do  not exceed 3600mg  of ibuprofen a day).  Take Roxicodone for breakthrough pain every 4 hours.  Take Colace for constipation related to narcotic pain medication. If you do not have a bowel movement in 2 days, take Miralax over the counter.  Drink plenty of water to also prevent constipation.   Contact Information: If you have questions or concerns, please call our office, [phone], Monday- Thursday 8AM-5PM and Friday 8AM-12Noon.  If it is after hours or on the weekend, please call [facility] Main [facility], [phone], [phone], and ask to speak to the surgeon on call for Dr. Henreitta Leber at [facility].    Providers Accepting New Patients in [place], [place]    [facility] [address]  [place], [place] [address] [phone] Accepts most insurances  [facility] [address] [place], [place] [address] [phone] Accepts most insurances  [facility] [address] [place], [place] [address]  [phone] Must meet requirements  [facility] [address] [place], [place] [address] [phone] Accepts most insurances  [facility] [address]  [place], [place] [address] [phone] Accepts most insurances  [facility] [address]   [place], [place]   [phone] Accepts most insurances  [facility])  [phone] [address]  [place], [place] [address] [phone] Accepts most insurances     [facility] [address]  [place], [place] [address] [phone] Accepts most insurances  [facility] [address] [place], [place] [address] [phone] option 1 Accepts Medicaid and [facility] [address]  [place], [place] [address] [phone] Accepts most insurances  Avon Gully, MD [address] [place], [place]  [address] [phone] Accepts most insurances  [facility] [address]  [place], [place] [address] [phone] Accepts most insurances  [facility] [phone] [address] [place], [place] [address] [phone] Accepts most insurances  [place] [place] [address], [address] [place], [place] [address] [phone]  Accepts most insurances
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
## 2023-05-09 NOTE — ED Notes (Signed)
Nurse called to floor to inform ED handoff was in chart- Fadila,RN nurse to review
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
## 2023-05-09 NOTE — Progress Notes (Signed)
PT arrived from surgery with dressing clean dry and intact with 20cc of output in JP drain. Pt is alert and oriented x4 and is still drowsy. Pt denies any pain at this time. NG hooked to low intermittent suction per MD order.  Pts pulse has been tachy and order for tele has been placed. Vitals obtained and Yellow MEWS protocol started.

## 2023-05-09 NOTE — Transfer of Care (Signed)
Immediate Anesthesia Transfer of Care Note  Patient: [name] Daniel  Procedure(s) Performed: OPEN RIGHT INCARCERATED INGUINAL HERNIA REPAIR WITH MESH, DRAIN PLACEMENT (Right: Inguinal)  Patient Location: PACU  Anesthesia Type:General  Level of Consciousness: awake and alert   Airway & Oxygen Therapy: Patient Spontanous Breathing and Patient connected to face mask oxygen  Post-op Assessment: Report given to RN and Post -op Vital signs reviewed and stable  Post vital signs: Reviewed and stable  Last Vitals:  Vitals Value Taken Time  BP 139/107   Temp 99.1   Pulse 106 05/09/23 1249  Resp 25 05/09/23 1249  SpO2 94 % 05/09/23 1249  Vitals shown include unfiled device data.  Last Pain:  Vitals:   05/09/23 0910  TempSrc: Oral  PainSc: 0-No pain         Complications: No notable events documented.
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
## 2023-05-09 NOTE — Anesthesia Preprocedure Evaluation (Signed)
Anesthesia Evaluation  Patient identified by MRN, date of birth, ID band Patient awake    Reviewed: Allergy & Precautions, H&P , NPO status , Patient's Chart, lab work & pertinent test results, reviewed documented beta blocker date and time   Airway Mallampati: II  TM Distance: >3 FB Neck ROM: full    Dental no notable dental hx.    Pulmonary neg pulmonary ROS, former smoker   Pulmonary exam normal breath sounds clear to auscultation       Cardiovascular Exercise Tolerance: Good hypertension, negative cardio ROS  Rhythm:regular Rate:Tachycardia     Neuro/Psych negative neurological ROS  negative psych ROS   GI/Hepatic negative GI ROS, Neg liver ROS,,,  Endo/Other  negative endocrine ROS    Renal/GU negative Renal ROS  negative genitourinary   Musculoskeletal   Abdominal   Peds  Hematology negative hematology ROS (+)   Anesthesia Other Findings   Reproductive/Obstetrics negative OB ROS                             Anesthesia Physical Anesthesia Plan  ASA: 4 and emergent  Anesthesia Plan: General and General ETT   Post-op Pain Management:    Induction:   PONV Risk Score and Plan: Ondansetron  Airway Management Planned:   Additional Equipment:   Intra-op Plan:   Post-operative Plan:   Informed Consent: I have reviewed the patients History and Physical, chart, labs and discussed the procedure including the risks, benefits and alternatives for the proposed anesthesia with the patient or authorized representative who has indicated his/her understanding and acceptance.     Dental Advisory Given  Plan Discussed with: CRNA  Anesthesia Plan Comments:        Anesthesia Quick Evaluation

## 2023-05-09 NOTE — Progress Notes (Signed)
[facility]  Updated sister Okey Regal about the surgery. NG in place until bowel function returns.  Algis Greenhouse, MD [facility] [address] [place], [place] [address] [phone] (office)

## 2023-05-09 NOTE — Anesthesia Procedure Notes (Addendum)
Procedure Name: Intubation Date/Time: 05/09/2023 11:01 AM  Performed by: Oletha Cruel, CRNAPre-anesthesia Checklist: Patient identified, Emergency Drugs available, Suction available and Patient being monitored Patient Re-evaluated:Patient Re-evaluated prior to induction Oxygen Delivery Method: Circle system utilized Preoxygenation: Pre-oxygenation with 100% oxygen Induction Type: IV induction Laryngoscope Size: Mac and 4 Grade View: Grade II Tube type: Oral Tube size: 7.0 mm Number of attempts: 1 Airway Equipment and Method: Stylet Placement Confirmation: ETT inserted through vocal cords under direct vision, positive ETCO2, CO2 detector and breath sounds checked- equal and bilateral Secured at: 22 cm Tube secured with: Tape Dental Injury: Teeth and Oropharynx as per pre-operative assessment  Comments: Preoxygenated prior to induction. No ventilation. RSI due to patient's report of nausea. Pre existing NGT connected to full suction. 175 cc of gastric content suctioned from stomach. Atraumatic intubation.Lips and teeth remain in preoperative condition.

## 2023-05-09 NOTE — Progress Notes (Signed)
   05/09/23 1249  TOC Brief Assessment  Insurance and Status Reviewed  Patient has primary care physician No (PCP list added to AVS)  Home environment has been reviewed Home  Prior level of function: independent  Prior/Current Home Services No current home services  Social Determinants of Health Reivew SDOH reviewed no interventions necessary  Readmission risk has been reviewed Yes  Transition of care needs no transition of care needs at this time   IN OR, TOC following. Added PCP list to AVS.
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
## 2023-05-10 LAB — BASIC METABOLIC PANEL
Anion gap: 11 (ref 5–15)
BUN: 25 mg/dL — ABNORMAL HIGH (ref 8–23)
CO2: 20 mmol/L — ABNORMAL LOW (ref 22–32)
Calcium: 8.1 mg/dL — ABNORMAL LOW (ref 8.9–10.3)
Chloride: 105 mmol/L (ref 98–111)
Creatinine, Ser: 1.14 mg/dL (ref 0.61–1.24)
GFR, Estimated: >60 mL/min (ref >60)
Glucose, Bld: 117 mg/dL — ABNORMAL HIGH (ref 70–99)
Potassium: 4.1 mmol/L (ref 3.5–5.1)
Sodium: 136 mmol/L (ref 135–145)

## 2023-05-10 LAB — GLUCOSE, CAPILLARY
Glucose-Capillary: 107 mg/dL — ABNORMAL HIGH (ref 70–99)
Glucose-Capillary: 114 mg/dL — ABNORMAL HIGH (ref 70–99)
Glucose-Capillary: 116 mg/dL — ABNORMAL HIGH (ref 70–99)
Glucose-Capillary: 128 mg/dL — ABNORMAL HIGH (ref 70–99)
Glucose-Capillary: 133 mg/dL — ABNORMAL HIGH (ref 70–99)
Glucose-Capillary: 139 mg/dL — ABNORMAL HIGH (ref 70–99)

## 2023-05-10 LAB — CBC WITH DIFFERENTIAL/PLATELET
Abs Immature Granulocytes: 0.01 10*3/uL (ref 0.00–0.07)
Basophils Absolute: 0 10*3/uL (ref 0.0–0.1)
Basophils Relative: 0 %
Eosinophils Absolute: 0.1 10*3/uL (ref 0.0–0.5)
Eosinophils Relative: 2 %
HCT: 39.2 % (ref 39.0–52.0)
Hemoglobin: 12.9 g/dL — ABNORMAL LOW (ref 13.0–17.0)
Immature Granulocytes: 0 %
Lymphocytes Relative: 15 %
Lymphs Abs: 0.9 10*3/uL (ref 0.7–4.0)
MCH: 31.9 pg (ref 26.0–34.0)
MCHC: 32.9 g/dL (ref 30.0–36.0)
MCV: 97 fL (ref 80.0–100.0)
Monocytes Absolute: 0.7 10*3/uL (ref 0.1–1.0)
Monocytes Relative: 13 %
Neutro Abs: 3.9 10*3/uL (ref 1.7–7.7)
Neutrophils Relative %: 70 %
Platelets: 171 10*3/uL (ref 150–400)
RBC: 4.04 MIL/uL — ABNORMAL LOW (ref 4.22–5.81)
RDW: 13.2 % (ref 11.5–15.5)
WBC: 5.7 10*3/uL (ref 4.0–10.5)
nRBC: 0 % (ref 0.0–0.2)

## 2023-05-10 LAB — MAGNESIUM: Magnesium: 2 mg/dL (ref 1.7–2.4)

## 2023-05-10 LAB — PHOSPHORUS: Phosphorus: 3.6 mg/dL (ref 2.5–4.6)

## 2023-05-10 LAB — HIV ANTIBODY (ROUTINE TESTING W REFLEX): HIV Screen 4th Generation wRfx: NONREACTIVE

## 2023-05-10 MED ORDER — BENZONATATE 100 MG PO CAPS
100.0000 mg | ORAL_CAPSULE | Freq: Three times a day (TID) | ORAL | Status: DC | PRN
  Administered 2023-05-10 – 2023-05-11 (×2): 100 mg via ORAL
  Filled 2023-05-10 (×3): qty 1

## 2023-05-10 MED ORDER — SODIUM CHLORIDE 0.9 % IV SOLN
INTRAVENOUS | Status: DC
Start: 2023-05-10 — End: 2023-05-11

## 2023-05-10 NOTE — Progress Notes (Signed)
Patient has had multiple bowel movements. Ice pack is applied per MD orders. Dressing clean dry and intact with 20cc bloody output in jp drain. Bladder scan performed with 400cc noted. Pt stood at bedside to void with 190cc of dark yellow urine. No foul odor but burning was noted. However, Patient is observed resting comfortably in bed at this time with no signs of distress. Assessment of pain was performed throughout my shift multiple times. 0/10 pain was noted each time, Will continue to monitor pt.

## 2023-05-10 NOTE — Plan of Care (Signed)

## 2023-05-10 NOTE — Progress Notes (Signed)
[facility] Progress Note  1 Day Post-Op  Subjective: Bms last night, NG fell out. He has been coughing and did have a possible run SVT but no complaints of chest pain or dizziness or SOB. EKG ordered.   Objective: Vital signs in last 24 hours: Temp:  [98.2 F (36.8 C)-100 F (37.8 C)] 99.3 F (37.4 C) (12/06 0931) Pulse Rate:  [102-111] 107 (12/06 1318) Resp:  [17-23] 20 (12/06 1318) BP: (94-125)/(69-101) 94/80 (12/06 1318) SpO2:  [90 %-100 %] 100 % (12/06 1318) Last BM Date : 05/10/23  Intake/Output from previous day: 12/05 0701 - 12/06 0700 In: 2650 [I.V.:2300; IV Piggyback:350] Out: 765 [Urine:400; Emesis/NG output:120; Drains:20; Blood:50] Intake/Output this shift: Total I/O In: 240 [P.O.:240] Out: -   General appearance: alert and no distress GI: soft, minimally distended, nontender, right groin mildly tender, JP with SS output dark   Lab Results:  Recent Labs    05/09/23 0202 05/10/23 0428  WBC 8.9 5.7  HGB 13.1 12.9*  HCT 38.3* 39.2  PLT 178 171   BMET Recent Labs    05/09/23 0202 05/10/23 0428  NA 132* 136  K 4.4 4.1  CL 98 105  CO2 23 20*  GLUCOSE 141* 117*  BUN 15 25*  CREATININE 1.02 1.14  CALCIUM 9.1 8.1*   PT/INR No results for input(s): "LABPROT", "INR" in the last 72 hours.  Studies/Results: DG Abd 1 View  Result Date: 05/09/2023 CLINICAL DATA:  NG tube placement EXAM: ABDOMEN - 1 VIEW COMPARISON:  CT 05/08/2023 FINDINGS: Limited field of view for tube placement verification. An enteric tube is been placed with tip projecting over the left upper quadrant consistent with location in the upper stomach. Dilated gas-filled small bowel demonstrated consistent with obstruction. Lung bases are clear. IMPRESSION: Enteric tube tip projects over the left upper quadrant consistent with location in the upper stomach. Electronically Signed   By: Burman Nieves M.D.   On: 05/09/2023 02:36   CT ABDOMEN PELVIS W CONTRAST  Result  Date: 05/08/2023 CLINICAL DATA:  Right inguinal hernia suspected. EXAM: CT ABDOMEN AND PELVIS WITH CONTRAST TECHNIQUE: Multidetector CT imaging of the abdomen and pelvis was performed using the standard protocol following bolus administration of intravenous contrast. RADIATION DOSE REDUCTION: This exam was performed according to the departmental dose-optimization program which includes automated exposure control, adjustment of the mA and/or kV according to patient size and/or use of iterative reconstruction technique. CONTRAST:  OMNIPAQUE IOHEXOL 300 MG/ML  SOLN COMPARISON:  None Available. FINDINGS: Lower chest: Small bilateral pleural effusions with partial compressive atelectasis of the lower lobes. Partially visualized pericardial effusion measuring 1 cm in thickness. No intra-abdominal free air.  Small ascites. Hepatobiliary: Two hypoenhancing lesions in the right lobe of the liver measure up to 17 mm, not characterized, possibly hemangioma. These can be better evaluated with MRI on a nonemergent/outpatient basis. There is mild biliary dilatation post cholecystectomy. No retained calcified stone noted in the central CBD. Pancreas: Unremarkable. No pancreatic ductal dilatation or surrounding inflammatory changes. Spleen: Normal in size without focal abnormality. Adrenals/Urinary Tract: The adrenal glands are unremarkable. There is no hydronephrosis on either side. There is symmetric enhancement and excretion of contrast by both kidneys. The visualized ureters and urinary bladder appear unremarkable. Stomach/Bowel: There is a large right inguinal hernia containing multiple loops of small bowel. There is narrowing of the bowel loops at the neck of the hernia. There is dilatation of the herniated loops of small bowel measuring up to 3.5 cm  in diameter. There is inflammatory changes and edema of the herniated bowel loops with small amount of fluid within the hernia. There is peripheral air along the body of  the herniated bowel loops. Findings consistent with small-bowel obstruction with possible pneumatosis. Clinical correlation and surgical consult is advised. There is diffuse dilatation of the small bowel loops throughout the abdomen measuring up to 4 cm. There is colonic diverticulosis. The appendix is normal. Vascular/Lymphatic: The abdominal aorta is unremarkable. No portal venous gas. There is no adenopathy. Reproductive: The prostate and seminal vesicles are grossly unremarkable. No pelvic mass. Other: None Musculoskeletal: Degenerative changes of the spine. No acute osseous pathology. IMPRESSION: 1. Small-bowel obstruction secondary to a large 66 right inguinal hernia with findings concerning for developing small-bowel pneumatosis/infarct in the right inguinal canal. Surgical consult is advised. No pneumoperitoneum or portal venous gas at this time. 2. Colonic diverticulosis. 3. Small bilateral pleural effusions with partial compressive atelectasis of the lower lobes. Electronically Signed   By: Elgie Collard M.D.   On: 05/08/2023 22:54   DG Chest 2 View  Result Date: 05/08/2023 CLINICAL DATA:  swelling EXAM: CHEST - 2 VIEW COMPARISON:  None Available. FINDINGS: Patient is rotated. The heart and mediastinal contours are difficult to evaluate due to patient rotation on frontal view. Biapical pleural/pulmonary scarring. No focal consolidation. No pulmonary edema. Flattening of bilateral hemidiaphragms with blunting of the costophrenic angles. No pneumothorax. No acute osseous abnormality. IMPRESSION: Trace bilateral pleural effusions not excluded. The heart and mediastinal contours are difficult to evaluate due to patient rotation on frontal view. Consider repeat chest x-ray PA and lateral view for further evaluation. Electronically Signed   By: Tish Frederickson M.D.   On: 05/08/2023 20:27    Anti-infectives: Anti-infectives (From admission, onward)    Start     Dose/Rate Route Frequency Ordered Stop    05/09/23 2200  cefoTEtan (CEFOTAN) 2 g in sodium chloride 0.9 % 100 mL IVPB        2 g 200 mL/hr over 30 Minutes Intravenous Every 12 hours 05/09/23 1414 05/10/23 1206   05/09/23 1000  cefoTEtan (CEFOTAN) 2 g in sodium chloride 0.9 % 100 mL IVPB        2 g 200 mL/hr over 30 Minutes Intravenous On call to O.R. 05/09/23 0840 05/09/23 1126       Assessment/Plan: Patient s/p OPEN RIGHT INCARCERATED INGUINAL HERNIA REPAIR WITH MESH, DRAIN PLACEMENT, LYSIS OF ADHESION. Doing well.  PRN for pain Mucinex for cough, added tessalon capsules IS, OOB Clear diet now Labs reassuring, hyponatremia resolved  No leukocytosis Scds, holding on anticoagulation JP in place      LOS: 1 day    [name] Daniel 05/10/2023

## 2023-05-11 LAB — BASIC METABOLIC PANEL
Anion gap: 10 (ref 5–15)
BUN: 23 mg/dL (ref 8–23)
CO2: 21 mmol/L — ABNORMAL LOW (ref 22–32)
Calcium: 8.2 mg/dL — ABNORMAL LOW (ref 8.9–10.3)
Chloride: 104 mmol/L (ref 98–111)
Creatinine, Ser: 1.06 mg/dL (ref 0.61–1.24)
GFR, Estimated: >60 mL/min (ref >60)
Glucose, Bld: 113 mg/dL — ABNORMAL HIGH (ref 70–99)
Potassium: 3.8 mmol/L (ref 3.5–5.1)
Sodium: 135 mmol/L (ref 135–145)

## 2023-05-11 LAB — GLUCOSE, CAPILLARY
Glucose-Capillary: 100 mg/dL — ABNORMAL HIGH (ref 70–99)
Glucose-Capillary: 105 mg/dL — ABNORMAL HIGH (ref 70–99)
Glucose-Capillary: 115 mg/dL — ABNORMAL HIGH (ref 70–99)
Glucose-Capillary: 121 mg/dL — ABNORMAL HIGH (ref 70–99)

## 2023-05-11 MED ORDER — POTASSIUM CHLORIDE CRYS ER 20 MEQ PO TBCR
20.0000 meq | EXTENDED_RELEASE_TABLET | Freq: Two times a day (BID) | ORAL | Status: DC
  Administered 2023-05-11 – 2023-05-12 (×3): 20 meq via ORAL
  Filled 2023-05-11 (×3): qty 1

## 2023-05-11 MED ORDER — OXYCODONE HCL 5 MG PO TABS: 5.0000 mg | ORAL_TABLET | ORAL | Status: DC | PRN

## 2023-05-11 MED ORDER — HYDROMORPHONE HCL 1 MG/ML IJ SOLN
0.5000 mg | INTRAMUSCULAR | Status: DC | PRN
  Administered 2023-05-12: 1 mg via INTRAVENOUS
  Filled 2023-05-11: qty 1

## 2023-05-11 MED ORDER — ZINC OXIDE 40 % EX OINT
TOPICAL_OINTMENT | CUTANEOUS | Status: DC | PRN
  Filled 2023-05-11: qty 57

## 2023-05-11 NOTE — Progress Notes (Signed)
[facility] Progress Note  2 Days Post-Op  Subjective: Bms continue. Eating.   Objective: Vital signs in last 24 hours: Temp:  [97.9 F (36.6 C)-98.3 F (36.8 C)] 98.3 F (36.8 C) (12/07 0420) Pulse Rate:  [103-110] 103 (12/07 0420) Resp:  [20] 20 (12/07 0420) BP: (92-104)/(74-85) 92/74 (12/07 0420) SpO2:  [97 %-100 %] 97 % (12/07 0420) Last BM Date : 05/11/23  Intake/Output from previous day: 12/06 0701 - 12/07 0700 In: 600 [P.O.:600] Out: 600 [Urine:600] Intake/Output this shift: No intake/output data recorded.  General appearance: alert and no distress GI: soft, nondistended, tender RLQ and right groin, bruised up to the flank from groin, JP with SS output, incision c/d/I with dermabond ,no erythema or drainage   Lab Results:  Recent Labs    05/09/23 0202 05/10/23 0428  WBC 8.9 5.7  HGB 13.1 12.9*  HCT 38.3* 39.2  PLT 178 171   BMET Recent Labs    05/10/23 0428 05/11/23 0412  NA 136 135  K 4.1 3.8  CL 105 104  CO2 20* 21*  GLUCOSE 117* 113*  BUN 25* 23  CREATININE 1.14 1.06  CALCIUM 8.1* 8.2*   PT/INR No results for input(s): "LABPROT", "INR" in the last 72 hours.  Studies/Results: No results found.  Anti-infectives: Anti-infectives (From admission, onward)    Start     Dose/Rate Route Frequency Ordered Stop   05/09/23 2200  cefoTEtan (CEFOTAN) 2 g in sodium chloride 0.9 % 100 mL IVPB        2 g 200 mL/hr over 30 Minutes Intravenous Every 12 hours 05/09/23 1414 05/10/23 1206   05/09/23 1000  cefoTEtan (CEFOTAN) 2 g in sodium chloride 0.9 % 100 mL IVPB        2 g 200 mL/hr over 30 Minutes Intravenous On call to O.R. 05/09/23 0840 05/09/23 1126       Assessment/Plan: Patient s/p OPEN RIGHT INCARCERATED INGUINAL HERNIA REPAIR WITH MESH, DRAIN PLACEMENT, LYSIS OF ADHESION. Doing well.  PRN for pain added roxicodone Mucinex for cough, tessalon capsules IS, OOB Soft diet Labs tomorrow, K replaced  Scds, holding on  anticoagulation given the bruising  JP in place    LOS: 2 days    Lucretia Roers 05/11/2023

## 2023-05-12 LAB — CBC WITH DIFFERENTIAL/PLATELET
Abs Immature Granulocytes: 0.06 10*3/uL (ref 0.00–0.07)
Basophils Absolute: 0 10*3/uL (ref 0.0–0.1)
Basophils Relative: 1 %
Eosinophils Absolute: 0.1 10*3/uL (ref 0.0–0.5)
Eosinophils Relative: 1 %
HCT: 33.4 % — ABNORMAL LOW (ref 39.0–52.0)
Hemoglobin: 11.4 g/dL — ABNORMAL LOW (ref 13.0–17.0)
Immature Granulocytes: 1 %
Lymphocytes Relative: 26 %
Lymphs Abs: 1.9 10*3/uL (ref 0.7–4.0)
MCH: 32.9 pg (ref 26.0–34.0)
MCHC: 34.1 g/dL (ref 30.0–36.0)
MCV: 96.3 fL (ref 80.0–100.0)
Monocytes Absolute: 0.4 10*3/uL (ref 0.1–1.0)
Monocytes Relative: 6 %
Neutro Abs: 4.8 10*3/uL (ref 1.7–7.7)
Neutrophils Relative %: 65 %
Platelets: 120 10*3/uL — ABNORMAL LOW (ref 150–400)
RBC: 3.47 MIL/uL — ABNORMAL LOW (ref 4.22–5.81)
RDW: 13.3 % (ref 11.5–15.5)
WBC: 7.2 10*3/uL (ref 4.0–10.5)
nRBC: 0 % (ref 0.0–0.2)

## 2023-05-12 LAB — BASIC METABOLIC PANEL
Anion gap: 5 (ref 5–15)
BUN: 23 mg/dL (ref 8–23)
CO2: 21 mmol/L — ABNORMAL LOW (ref 22–32)
Calcium: 7.8 mg/dL — ABNORMAL LOW (ref 8.9–10.3)
Chloride: 105 mmol/L (ref 98–111)
Creatinine, Ser: 0.93 mg/dL (ref 0.61–1.24)
GFR, Estimated: >60 mL/min (ref >60)
Glucose, Bld: 118 mg/dL — ABNORMAL HIGH (ref 70–99)
Potassium: 3.8 mmol/L (ref 3.5–5.1)
Sodium: 131 mmol/L — ABNORMAL LOW (ref 135–145)

## 2023-05-12 MED ORDER — AZITHROMYCIN 250 MG PO TABS: ORAL_TABLET | ORAL | 0 refills | Status: DC

## 2023-05-12 MED ORDER — AZITHROMYCIN 250 MG PO TABS: 250.0000 mg | ORAL_TABLET | Freq: Every day | ORAL | Status: DC

## 2023-05-12 MED ORDER — BENZONATATE 100 MG PO CAPS: 100.0000 mg | ORAL_CAPSULE | Freq: Three times a day (TID) | ORAL | 0 refills | Status: DC | PRN

## 2023-05-12 MED ORDER — OXYCODONE HCL 5 MG PO TABS: 5.0000 mg | ORAL_TABLET | ORAL | 0 refills | Status: DC | PRN

## 2023-05-12 MED ORDER — AZITHROMYCIN 250 MG PO TABS
500.0000 mg | ORAL_TABLET | Freq: Once | ORAL | Status: AC
  Administered 2023-05-12: 500 mg via ORAL
  Filled 2023-05-12: qty 2

## 2023-05-12 NOTE — Plan of Care (Signed)

## 2023-05-12 NOTE — Discharge Summary (Signed)
Physician Discharge Summary  Patient ID: [name] Daniel MRN: [id number] DOB/AGE: 1955-10-29 67 y.o.  Admit date: 05/08/2023 Discharge date: 05/12/2023  Admission Diagnoses:  Incarcerated inguinal hernia with SBO  Discharge Diagnoses:  Principal Problem:   Incarcerated hernia Active Problems:   Intestinal obstruction (HCC)   Upper respiratory infection   Discharged Condition: good  Hospital Course: [name] Daniel is a 67 yo who came in with an incarcerated hernia and SBO. He was taken to the OR and had the hernia repaired with mesh. He had some adhesions causing the SBO in association to the hernia. He did fair post op and started having bowel function. He was started and advanced on a diet. Prior to his admission he reported a chronic cough for 2 weeks. CXR showed no PNA but he continued to have a cough during his stay. Given this prolonged upper respiratory symptoms, he likely had a upper respiratory infection that had been present on admission and continued. Given the duration it has likely gone from viral to bacteria. I have prescribed in a Zpak for this issue. Prior to discharge home he was ambulating, urinating, having BM, eating, and knew how to care for his JP drain.   He was given information by SW for PCP.   Consults:  Hospitalist admission- taken over by surgery  Significant Diagnostic Studies: CT - incarcerated right inguinal hernia, SBO  Treatments: IV hydration and 12/5 Right inguinal hernia repair with mesh, lysis of adhesions, reduction of obstruction, treatment for upper respiratory infection present on admission with zpak to be completed at home   Discharge Exam: Blood pressure (!) 142/108, pulse (!) 101, temperature 98.4 F (36.9 C), temperature source Oral, resp. rate 17, height 6' (1.829 m), weight 72.8 kg, SpO2 97%. General appearance: alert and no distress Resp: normal work of breathing, cough  GI: soft, non-distended, appropriately tender, bruising into the right  flank, JP d rain with SS Output   Disposition: Discharge disposition: 01-Home or Self Care       Discharge Instructions     Call MD for:  difficulty breathing, headache or visual disturbances   Complete by: As directed    Call MD for:  extreme fatigue   Complete by: As directed    Call MD for:  persistant dizziness or light-headedness   Complete by: As directed    Call MD for:  persistant nausea and vomiting   Complete by: As directed    Call MD for:  redness, tenderness, or signs of infection (pain, swelling, redness, odor or green/yellow discharge around incision site)   Complete by: As directed    Call MD for:  severe uncontrolled pain   Complete by: As directed    Call MD for:  temperature >100.4   Complete by: As directed    Increase activity slowly   Complete by: As directed       Allergies as of 05/12/2023       Reactions   Penicillins Nausea And Vomiting   Tolerates Cephalosporin    Vicodin [hydrocodone-acetaminophen] Nausea Only        Medication List     TAKE these medications    acetaminophen 500 MG tablet Commonly known as: TYLENOL Take 500 mg by mouth every 6 (six) hours as needed for mild pain (pain score 1-3).   azithromycin 250 MG tablet Commonly known as: ZITHROMAX Take one pill daily starting 05/13/23 Monday Start taking on: May 13, 2023   benzonatate 100 MG capsule Commonly known as: TESSALON  Take 1 capsule (100 mg total) by mouth 3 (three) times daily as needed for cough.   oxyCODONE 5 MG immediate release tablet Commonly known as: Oxy IR/ROXICODONE Take 1 tablet (5 mg total) by mouth every 4 (four) hours as needed for severe pain (pain score 7-10) or breakthrough pain.        Follow-up Information     Lucretia Roers, MD Follow up on 05/16/2023.   Specialty: General Surgery Why: drain removal, bring your sheet with all the amounts and color of the drainage documented Contact information: [address] [place]  [facility] [address] [phone]                 Signed: Lucretia Roers 05/12/2023, 10:40 AM

## 2023-05-13 ENCOUNTER — Encounter (HOSPITAL_COMMUNITY): Payer: Self-pay | Admitting: General Surgery

## 2023-05-16 ENCOUNTER — Encounter: Payer: Self-pay | Admitting: General Surgery

## 2023-05-16 ENCOUNTER — Ambulatory Visit (INDEPENDENT_AMBULATORY_CARE_PROVIDER_SITE_OTHER): Payer: Self-pay | Admitting: General Surgery

## 2023-05-16 VITALS — BP 128/92 | HR 107 | Temp 97.5°F | Resp 18 | Ht 72.0 in | Wt 164.0 lb

## 2023-05-16 DIAGNOSIS — S301XXA Contusion of abdominal wall, initial encounter: Secondary | ICD-10-CM

## 2023-05-16 MED ORDER — OXYCODONE HCL 5 MG PO TABS: 5.0000 mg | ORAL_TABLET | ORAL | 0 refills | Status: DC | PRN

## 2023-05-16 NOTE — Anesthesia Postprocedure Evaluation (Signed)
Anesthesia Post Note  Patient: [name] Daniel  Procedure(s) Performed: OPEN RIGHT INCARCERATED INGUINAL HERNIA REPAIR WITH MESH, DRAIN PLACEMENT (Right: Inguinal) LYSIS OF ADHESION (Abdomen)  Patient location during evaluation: Phase II Anesthesia Type: General Level of consciousness: awake Pain management: pain level controlled Vital Signs Assessment: post-procedure vital signs reviewed and stable Respiratory status: spontaneous breathing and respiratory function stable Cardiovascular status: blood pressure returned to baseline and stable Postop Assessment: no headache and no apparent nausea or vomiting Anesthetic complications: no Comments: Late entry   No notable events documented.   Last Vitals:  Vitals:   05/11/23 1957 05/12/23 0615  BP: (!) 113/96 (!) 142/108  Pulse: (!) 56 (!) 101  Resp: 17 17  Temp: 36.8 C 36.9 C  SpO2: 99% 97%    Last Pain:  Vitals:   05/12/23 0900  TempSrc:   PainSc: Asleep                 Windell Norfolk

## 2023-05-16 NOTE — Progress Notes (Signed)
[facility]  Feeling better. The JP has put out SS fluid about 5-10 a day. He has no other major complaints. He does need some more pain medication.  BP (!) 128/92   Pulse (!) 107   Temp (!) 97.5 F (36.4 C) (Oral)   Resp 18   Ht 6' (1.829 m)   Wt 164 lb (74.4 kg)   SpO2 96%   BMI 22.24 kg/m  Right inguinal hernia, seroma over the area, no signs of recurrence, JP with Ss output, minimal in bulb removed   Patient s/p open right inguinal hernia repair with mesh, lysis of adhesions for incarcerated hernia with SBO.   You have some seroma in the inguinal region. You could develop in the scrotum after the drain removal.  JP drain was taken out and dressing to remain in place for 48 hours. No heavy lifting > 10 lbs, excessive bending, pushing, pulling, or squatting for 6-8 weeks after surgery.  Diet as tolerated. Keep stools regular and soft. Call with questions or concerns   Future Appointments  Date Time Provider Department Center  06/13/2023  2:15 PM Lucretia Roers, MD [facility] None   Algis Greenhouse, MD [facility] [address] [place], [place] [address] [phone] (office)

## 2023-05-16 NOTE — Patient Instructions (Signed)
You have some seroma in the inguinal region. You could develop in the scrotum after the drain removal.  JP drain was taken out and dressing to remain in place for 48 hours. No heavy lifting > 10 lbs, excessive bending, pushing, pulling, or squatting for 6-8 weeks after surgery.  Diet as tolerated. Keep stools regular and soft. Call with questions or concerns

## 2023-06-03 ENCOUNTER — Emergency Department (HOSPITAL_COMMUNITY): Payer: Medicare Other

## 2023-06-03 ENCOUNTER — Other Ambulatory Visit: Payer: Self-pay

## 2023-06-03 ENCOUNTER — Telehealth: Payer: Self-pay | Admitting: *Deleted

## 2023-06-03 ENCOUNTER — Encounter (HOSPITAL_COMMUNITY): Payer: Self-pay | Admitting: *Deleted

## 2023-06-03 ENCOUNTER — Inpatient Hospital Stay (HOSPITAL_COMMUNITY)
Admission: EM | Admit: 2023-06-03 | Discharge: 2023-06-07 | DRG: 292 | Disposition: A | Discharge status: Home / Self Care | Payer: Medicare Other | Attending: Family Medicine | Admitting: Family Medicine

## 2023-06-03 DIAGNOSIS — R0602 Shortness of breath: Secondary | ICD-10-CM | POA: Diagnosis not present

## 2023-06-03 DIAGNOSIS — E876 Hypokalemia: Secondary | ICD-10-CM | POA: Diagnosis present

## 2023-06-03 DIAGNOSIS — N1831 Chronic kidney disease, stage 3a: Secondary | ICD-10-CM | POA: Diagnosis present

## 2023-06-03 DIAGNOSIS — I350 Nonrheumatic aortic (valve) stenosis: Secondary | ICD-10-CM

## 2023-06-03 DIAGNOSIS — E538 Deficiency of other specified B group vitamins: Secondary | ICD-10-CM | POA: Diagnosis present

## 2023-06-03 DIAGNOSIS — I429 Cardiomyopathy, unspecified: Secondary | ICD-10-CM

## 2023-06-03 DIAGNOSIS — Z885 Allergy status to narcotic agent status: Secondary | ICD-10-CM

## 2023-06-03 DIAGNOSIS — I5021 Acute systolic (congestive) heart failure: Principal | ICD-10-CM | POA: Diagnosis present

## 2023-06-03 DIAGNOSIS — D696 Thrombocytopenia, unspecified: Secondary | ICD-10-CM | POA: Diagnosis present

## 2023-06-03 DIAGNOSIS — Z79899 Other long term (current) drug therapy: Secondary | ICD-10-CM

## 2023-06-03 DIAGNOSIS — I712 Thoracic aortic aneurysm, without rupture, unspecified: Secondary | ICD-10-CM | POA: Diagnosis present

## 2023-06-03 DIAGNOSIS — Z880 Allergy status to penicillin: Secondary | ICD-10-CM

## 2023-06-03 DIAGNOSIS — Z87891 Personal history of nicotine dependence: Secondary | ICD-10-CM

## 2023-06-03 DIAGNOSIS — I509 Heart failure, unspecified: Secondary | ICD-10-CM

## 2023-06-03 DIAGNOSIS — I3139 Other pericardial effusion (noninflammatory): Secondary | ICD-10-CM | POA: Diagnosis present

## 2023-06-03 DIAGNOSIS — R7989 Other specified abnormal findings of blood chemistry: Secondary | ICD-10-CM | POA: Diagnosis present

## 2023-06-03 DIAGNOSIS — Z1152 Encounter for screening for COVID-19: Secondary | ICD-10-CM

## 2023-06-03 DIAGNOSIS — I7121 Aneurysm of the ascending aorta, without rupture: Secondary | ICD-10-CM | POA: Diagnosis present

## 2023-06-03 DIAGNOSIS — R000 Tachycardia, unspecified: Secondary | ICD-10-CM | POA: Diagnosis present

## 2023-06-03 LAB — CBC WITH DIFFERENTIAL/PLATELET
Abs Immature Granulocytes: 0.01 10*3/uL (ref 0.00–0.07)
Basophils Absolute: 0 10*3/uL (ref 0.0–0.1)
Basophils Relative: 1 %
Eosinophils Absolute: 0 10*3/uL (ref 0.0–0.5)
Eosinophils Relative: 1 %
HCT: 40.5 % (ref 39.0–52.0)
Hemoglobin: 13.5 g/dL (ref 13.0–17.0)
Immature Granulocytes: 0 %
Lymphocytes Relative: 35 %
Lymphs Abs: 1.5 10*3/uL (ref 0.7–4.0)
MCH: 31.6 pg (ref 26.0–34.0)
MCHC: 33.3 g/dL (ref 30.0–36.0)
MCV: 94.8 fL (ref 80.0–100.0)
Monocytes Absolute: 0.2 10*3/uL (ref 0.1–1.0)
Monocytes Relative: 4 %
Neutro Abs: 2.6 10*3/uL (ref 1.7–7.7)
Neutrophils Relative %: 59 %
Platelets: 124 10*3/uL — ABNORMAL LOW (ref 150–400)
RBC: 4.27 MIL/uL (ref 4.22–5.81)
RDW: 14.3 % (ref 11.5–15.5)
WBC: 4.4 10*3/uL (ref 4.0–10.5)
nRBC: 0 % (ref 0.0–0.2)

## 2023-06-03 LAB — COMPREHENSIVE METABOLIC PANEL
ALT: 12 U/L (ref 0–44)
AST: 20 U/L (ref 15–41)
Albumin: 4.1 g/dL (ref 3.5–5.0)
Alkaline Phosphatase: 62 U/L (ref 38–126)
Anion gap: 10 (ref 5–15)
BUN: 17 mg/dL (ref 8–23)
CO2: 22 mmol/L (ref 22–32)
Calcium: 9.4 mg/dL (ref 8.9–10.3)
Chloride: 104 mmol/L (ref 98–111)
Creatinine, Ser: 1.26 mg/dL — ABNORMAL HIGH (ref 0.61–1.24)
GFR, Estimated: >60 mL/min (ref >60)
Glucose, Bld: 122 mg/dL — ABNORMAL HIGH (ref 70–99)
Potassium: 4.5 mmol/L (ref 3.5–5.1)
Sodium: 136 mmol/L (ref 135–145)
Total Bilirubin: 1.2 mg/dL (ref 0.0–1.2)
Total Protein: 7.4 g/dL (ref 6.5–8.1)

## 2023-06-03 LAB — RESP PANEL BY RT-PCR (RSV, FLU A&B, COVID)  RVPGX2
Influenza A by PCR: NEGATIVE
Influenza B by PCR: NEGATIVE
Resp Syncytial Virus by PCR: NEGATIVE
SARS Coronavirus 2 by RT PCR: NEGATIVE

## 2023-06-03 LAB — D-DIMER, QUANTITATIVE: D-Dimer, Quant: 1.82 ug{FEU}/mL — ABNORMAL HIGH (ref 0.00–0.50)

## 2023-06-03 LAB — BRAIN NATRIURETIC PEPTIDE: B Natriuretic Peptide: 3234 pg/mL — ABNORMAL HIGH (ref 0.0–100.0)

## 2023-06-03 LAB — TROPONIN I (HIGH SENSITIVITY): Troponin I (High Sensitivity): 91 ng/L — ABNORMAL HIGH (ref <18)

## 2023-06-03 MED ORDER — IOHEXOL 350 MG/ML SOLN
75.0000 mL | Freq: Once | INTRAVENOUS | Status: AC | PRN
  Administered 2023-06-04: 75 mL via INTRAVENOUS

## 2023-06-03 MED ORDER — FUROSEMIDE 10 MG/ML IJ SOLN
20.0000 mg | Freq: Once | INTRAMUSCULAR | Status: AC
  Administered 2023-06-03: 20 mg via INTRAVENOUS
  Filled 2023-06-03: qty 2

## 2023-06-03 NOTE — Telephone Encounter (Signed)
Surgical Date: 05/09/2023 Procedure: OPEN RIGHT INCARCERATED INGUINAL HERNIA REPAIR WITH MESH   Received call from patient [phone]~ telephone  Patient reports that he has noted DOE after surgery. States that he feels he cannot walk to mailbox and back without having to stop and catch his breath.   Advised to  follow up with PCP, but patient states that he does not currently have PCP. Advised if SOB noted extreme, he should return to ER for evaluation.

## 2023-06-03 NOTE — ED Triage Notes (Signed)
Pt with SOB x 2 weeks, pt states had hernia surgery 2 weeks here.
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
## 2023-06-03 NOTE — Telephone Encounter (Signed)
Call placed to patient. States that breathing has worsened so he is going to ER tonight.

## 2023-06-04 ENCOUNTER — Inpatient Hospital Stay (HOSPITAL_COMMUNITY): Payer: Medicare Other

## 2023-06-04 ENCOUNTER — Other Ambulatory Visit (HOSPITAL_COMMUNITY): Payer: Self-pay | Admitting: *Deleted

## 2023-06-04 DIAGNOSIS — I712 Thoracic aortic aneurysm, without rupture, unspecified: Secondary | ICD-10-CM

## 2023-06-04 DIAGNOSIS — E876 Hypokalemia: Secondary | ICD-10-CM | POA: Diagnosis present

## 2023-06-04 DIAGNOSIS — I5031 Acute diastolic (congestive) heart failure: Secondary | ICD-10-CM

## 2023-06-04 DIAGNOSIS — Z79899 Other long term (current) drug therapy: Secondary | ICD-10-CM | POA: Diagnosis not present

## 2023-06-04 DIAGNOSIS — D696 Thrombocytopenia, unspecified: Secondary | ICD-10-CM | POA: Diagnosis present

## 2023-06-04 DIAGNOSIS — I3139 Other pericardial effusion (noninflammatory): Secondary | ICD-10-CM | POA: Diagnosis present

## 2023-06-04 DIAGNOSIS — E538 Deficiency of other specified B group vitamins: Secondary | ICD-10-CM | POA: Diagnosis present

## 2023-06-04 DIAGNOSIS — Z1152 Encounter for screening for COVID-19: Secondary | ICD-10-CM | POA: Diagnosis not present

## 2023-06-04 DIAGNOSIS — I5021 Acute systolic (congestive) heart failure: Principal | ICD-10-CM

## 2023-06-04 DIAGNOSIS — I429 Cardiomyopathy, unspecified: Secondary | ICD-10-CM | POA: Diagnosis present

## 2023-06-04 DIAGNOSIS — I7121 Aneurysm of the ascending aorta, without rupture: Secondary | ICD-10-CM | POA: Diagnosis present

## 2023-06-04 DIAGNOSIS — R0602 Shortness of breath: Secondary | ICD-10-CM | POA: Diagnosis present

## 2023-06-04 DIAGNOSIS — I350 Nonrheumatic aortic (valve) stenosis: Secondary | ICD-10-CM | POA: Diagnosis present

## 2023-06-04 DIAGNOSIS — Z880 Allergy status to penicillin: Secondary | ICD-10-CM | POA: Diagnosis not present

## 2023-06-04 DIAGNOSIS — R000 Tachycardia, unspecified: Secondary | ICD-10-CM | POA: Diagnosis present

## 2023-06-04 DIAGNOSIS — Z87891 Personal history of nicotine dependence: Secondary | ICD-10-CM | POA: Diagnosis not present

## 2023-06-04 DIAGNOSIS — Z885 Allergy status to narcotic agent status: Secondary | ICD-10-CM | POA: Diagnosis not present

## 2023-06-04 DIAGNOSIS — I509 Heart failure, unspecified: Secondary | ICD-10-CM

## 2023-06-04 DIAGNOSIS — N1831 Chronic kidney disease, stage 3a: Secondary | ICD-10-CM | POA: Diagnosis present

## 2023-06-04 LAB — ECHOCARDIOGRAM COMPLETE
AR max vel: 0.39 cm2
AV Area VTI: 0.49 cm2
AV Area mean vel: 0.39 cm2
AV Mean grad: 24 mm[Hg]
AV Peak grad: 39.8 mm[Hg]
Ao pk vel: 3.15 m/s
Area-P 1/2: 9.37 cm2
Calc EF: 21.2 %
Est EF: 20
Height: 72 in
MV M vel: 5.13 m/s
MV Peak grad: 105.3 mm[Hg]
MV VTI: 0.77 cm2
S' Lateral: 5.2 cm
Single Plane A2C EF: 22.1 %
Single Plane A4C EF: 21.8 %
Weight: 2479.73 [oz_av]

## 2023-06-04 LAB — BASIC METABOLIC PANEL
Anion gap: 11 (ref 5–15)
BUN: 20 mg/dL (ref 8–23)
CO2: 21 mmol/L — ABNORMAL LOW (ref 22–32)
Calcium: 9.3 mg/dL (ref 8.9–10.3)
Chloride: 101 mmol/L (ref 98–111)
Creatinine, Ser: 1.41 mg/dL — ABNORMAL HIGH (ref 0.61–1.24)
GFR, Estimated: 55 mL/min — ABNORMAL LOW (ref >60)
Glucose, Bld: 123 mg/dL — ABNORMAL HIGH (ref 70–99)
Potassium: 3.9 mmol/L (ref 3.5–5.1)
Sodium: 133 mmol/L — ABNORMAL LOW (ref 135–145)

## 2023-06-04 LAB — VITAMIN B12: Vitamin B-12: 141 pg/mL — ABNORMAL LOW (ref 180–914)

## 2023-06-04 LAB — FOLATE: Folate: 5.1 ng/mL — ABNORMAL LOW (ref >5.9)

## 2023-06-04 LAB — TSH: TSH: 1.029 u[IU]/mL (ref 0.350–4.500)

## 2023-06-04 LAB — T4, FREE: Free T4: 1.2 ng/dL — ABNORMAL HIGH (ref 0.61–1.12)

## 2023-06-04 LAB — LACTIC ACID, PLASMA: Lactic Acid, Venous: 1.4 mmol/L (ref 0.5–1.9)

## 2023-06-04 LAB — MAGNESIUM: Magnesium: 2.2 mg/dL (ref 1.7–2.4)

## 2023-06-04 LAB — TROPONIN I (HIGH SENSITIVITY): Troponin I (High Sensitivity): 91 ng/L — ABNORMAL HIGH (ref <18)

## 2023-06-04 MED ORDER — ONDANSETRON HCL 4 MG/2ML IJ SOLN: 4.0000 mg | Freq: Four times a day (QID) | INTRAMUSCULAR | Status: DC | PRN

## 2023-06-04 MED ORDER — SODIUM CHLORIDE 0.9% FLUSH
3.0000 mL | Freq: Two times a day (BID) | INTRAVENOUS | Status: DC
Start: 2023-06-04 — End: 2023-06-05
  Administered 2023-06-04 – 2023-06-05 (×3): 3 mL via INTRAVENOUS

## 2023-06-04 MED ORDER — OXYCODONE HCL 5 MG PO TABS
5.0000 mg | ORAL_TABLET | Freq: Once | ORAL | Status: AC | PRN
  Administered 2023-06-04: 5 mg via ORAL
  Filled 2023-06-04: qty 1

## 2023-06-04 MED ORDER — ACETAMINOPHEN 325 MG PO TABS
650.0000 mg | ORAL_TABLET | ORAL | Status: DC | PRN
Start: 2023-06-04 — End: 2023-06-07
  Administered 2023-06-04 – 2023-06-06 (×3): 650 mg via ORAL
  Filled 2023-06-04 (×3): qty 2

## 2023-06-04 MED ORDER — PERFLUTREN LIPID MICROSPHERE
1.0000 mL | INTRAVENOUS | Status: AC | PRN
  Administered 2023-06-04: 3 mL via INTRAVENOUS

## 2023-06-04 MED ORDER — SODIUM CHLORIDE 0.9 % IV SOLN: 250.0000 mL | INTRAVENOUS | Status: DC | PRN

## 2023-06-04 MED ORDER — ENOXAPARIN SODIUM 40 MG/0.4ML IJ SOSY
40.0000 mg | PREFILLED_SYRINGE | INTRAMUSCULAR | Status: DC
Start: 2023-06-04 — End: 2023-06-07
  Administered 2023-06-04 – 2023-06-07 (×4): 40 mg via SUBCUTANEOUS
  Filled 2023-06-04 (×4): qty 0.4

## 2023-06-04 MED ORDER — FUROSEMIDE 10 MG/ML IJ SOLN
20.0000 mg | Freq: Two times a day (BID) | INTRAMUSCULAR | Status: AC
  Administered 2023-06-04 – 2023-06-06 (×5): 20 mg via INTRAVENOUS
  Filled 2023-06-04 (×5): qty 2

## 2023-06-04 MED ORDER — SODIUM CHLORIDE 0.9% FLUSH: 3.0000 mL | INTRAVENOUS | Status: DC | PRN

## 2023-06-04 MED ORDER — NITROGLYCERIN 2 % TD OINT
1.0000 [in_us] | TOPICAL_OINTMENT | Freq: Once | TRANSDERMAL | Status: AC
  Administered 2023-06-04: 1 [in_us] via TOPICAL
  Filled 2023-06-04: qty 1

## 2023-06-04 NOTE — Discharge Instructions (Addendum)
Providers Accepting New Patients in [place], [place]    [facility] [address]  [place], [place] [address] [phone] Accepts most insurances  [facility] [address] [place], [place] [address] [phone] Accepts most insurances  [facility] [place] [address] [place], [place] [address]  [phone] Must meet requirements  [facility] [address]  [place], [place] [address] [phone] Accepts most insurances  [facility] [address]  [place], [place] [address] [phone] Accepts most insurances  [facility] [address]   [place], [place]   [phone] Accepts most insurances  [facility] ([facility])  [address]  [place], [place] [address] [phone] Accepts most insurances     [facility] [address]  [place], [place] [address] [phone] Accepts most insurances  [facility] [address] [place], [place] [address] [phone] option 1 Accepts Medicaid and Uninsured  [facility] [address]  [place], [place] [address] [phone] Accepts most insurances  Tesfaye Fanta, MD [address]  [place], [place] [address] [phone] Accepts most insurances  [facility] [place] [address]  [place], [place] [address] [phone] Accepts most insurances  [facility] [address] [place], [place] [address] [phone] Accepts most insurances  Zack Hall, MD [address] [place], [place] [address] [phone]  Accepts most insurances      IMPORTANT INFORMATION: PAY CLOSE ATTENTION   PHYSICIAN DISCHARGE INSTRUCTIONS  Follow with Primary care provider  Patient, No Pcp Per  and other consultants as instructed by your Hospitalist Physician  SEEK MEDICAL CARE OR RETURN TO EMERGENCY ROOM IF  SYMPTOMS COME BACK, WORSEN OR NEW PROBLEM DEVELOPS   Please note: You were cared for by a hospitalist during your hospital stay. Every effort will be made to forward records to your primary care provider.  You can request that your primary care provider send for your hospital records if they have not received them.  Once you are discharged, your primary care physician will handle any further medical issues. Please note that NO REFILLS for any discharge medications will be authorized once you are discharged, as it is imperative that you return to your primary care physician (or establish a relationship with a primary care physician if you do not have one) for your post hospital discharge needs so that they can reassess your need for medications and monitor your lab values.  Please get a complete blood count and chemistry panel checked by your Primary MD at your next visit, and again as instructed by your Primary MD.  Get Medicines reviewed and adjusted: Please take all your medications with you for your next visit with your Primary MD  Laboratory/radiological data: Please request your Primary MD to go over all hospital tests and procedure/radiological results at the follow up, please ask your primary care provider to get all Hospital records sent to his/her office.  In some cases, they will be blood work, cultures and biopsy results pending at the time of your discharge. Please request that your primary care provider follow up on these results.  If you are diabetic, please bring your blood sugar readings with you to your follow up appointment with primary care.    Please call and make your follow up appointments as soon as possible.    Also Note the following: If you experience worsening   of your admission symptoms, develop shortness of breath, life threatening emergency, suicidal or homicidal thoughts you must seek medical attention immediately by calling 911 or calling your MD immediately  if  symptoms less severe.  You must read complete instructions/literature along with all the possible adverse reactions/side effects for all the Medicines you take and that have been prescribed to you. Take any new Medicines after you have completely understood and accpet all the possible adverse reactions/side effects.   Do not drive when taking Pain medications or sleeping medications (Benzodiazepines)  Do not take more than prescribed Pain, Sleep and Anxiety Medications. It is not advisable to combine anxiety,sleep and pain medications without talking with your primary care practitioner  Special Instructions: If you have smoked or chewed Tobacco  in the last 2 yrs please stop smoking, stop any regular Alcohol  and or any Recreational drug use.  Wear Seat belts while driving.  Do not drive if taking any narcotic, mind altering or controlled substances or recreational drugs or alcohol.                    

## 2023-06-04 NOTE — Progress Notes (Signed)
*  PRELIMINARY RESULTS* Echocardiogram 2D Echocardiogram has been performed with Definity.  Stacey Drain 06/04/2023, 1:14 PM

## 2023-06-04 NOTE — Hospital Course (Signed)
 67 year old male with no documented chronic medical problems presenting with 3-week history of progressive shortness of breath.  The patient was recently discharged from the hospital after a stay from 05/08/2023 to 05/12/2023 secondary to incarcerated hernia resulting in SBO.  The patient was seen by general surgery at that time and underwent a mesh repair for his hernia.  He was discharged home in stable condition at that time.  However the patient stated that he had some coughing and shortness of breath at that time.  He he was discharged home with azithromycin . He states that over the past 2 to 3 weeks his shortness of breath has progressed.  Initially he has some dyspnea on exertion, but now he is having some shortness of breath at rest.  He denies any fevers, chills, nausea, vomiting, diarrhea, domino pain.  Last bowel movement was on 06/03/2023. He has some chest discomfort primarily with coughing.  There is no exertional chest discomfort.  He does note some orthopnea type symptoms.  He does not weigh himself.  He does not feel that his legs are more edematous than usual.  Because of progressive shortness of breath he presented for further evaluation and treatment. In the ED, the patient was afebrile hemodynamically stable with oxygen saturation 95% room air.  WBC 4.4, hemoglobin 13.5, platelets 124.  Sodium 136, potassium 4.5, bicarbonate 22, serum creatinine 1.26.  AST 20, ALT 12, alkaline phosphatase 62, total bilirubin 1.2.  BNP 3234.  Troponin 91>>91 CTA chest negative for PE.  There is a moderate right pleural effusion, small left pleural effusion.  There is a moderate pericardial effusion.  The patient was given furosemide  20 mg IV x 1.
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
## 2023-06-04 NOTE — ED Notes (Signed)
 Patient transported to CT

## 2023-06-04 NOTE — TOC Initial Note (Signed)
 Transition of Care [facility]) - Initial/Assessment Note    Patient Details  Name: [name] Daniel MRN: [id number] Date of Birth: August 15, 1955  [facility] CM/SW Contact:    Mcarthur Saddie Kim, LCSW Phone Number: 06/04/2023, 10:47 AM  Clinical Narrative:  Pt admitted due to new CHF diagnosis. Pt reports he lives in an apartment behind his mother. She is now under hospice care. Pt indicates he is independent with ADLs at baseline and his sister typically provides transportation for him. LCSW reviewed CHF education including daily weights, heart healthy diet, and taking medications as prescribed. CHF education added to AVS. Pt does not have PCP. PCP list added to AVS. However, pt reports he would have difficulty covering costs due to only have Medicare Part A. Discussed applying for Medicaid and pt aware to contact [facility].                  Expected Discharge Plan: Home/Self Care Barriers to Discharge: Continued Medical Work up   Patient Goals and CMS Choice Patient states their goals for this hospitalization and ongoing recovery are:: return home          Expected Discharge Plan and Services In-house Referral: Clinical Social Work     Living arrangements for the past 2 months: Apartment                                      Prior Living Arrangements/Services Living arrangements for the past 2 months: Apartment Lives with:: Self Patient language and need for interpreter reviewed:: Yes Do you feel safe going back to the place where you live?: Yes      Need for Family Participation in Patient Care: No (Comment)     Criminal Activity/Legal Involvement Pertinent to Current Situation/Hospitalization: No - Comment as needed  Activities of Daily Living   ADL Screening (condition at time of admission) Independently performs ADLs?: Yes (appropriate for developmental age) Is the patient deaf or have difficulty hearing?: No Does the patient have  difficulty seeing, even when wearing glasses/contacts?: No Does the patient have difficulty concentrating, remembering, or making decisions?: No  Permission Sought/Granted                  Emotional Assessment     Affect (typically observed): Appropriate Orientation: : Oriented to Self, Oriented to Place, Oriented to  Time, Oriented to Situation Alcohol / Substance Use: Not Applicable Psych Involvement: No (comment)  Admission diagnosis:  Acute CHF (congestive heart failure) (HCC) [I50.9] Exertional shortness of breath [R06.02] Acute congestive heart failure, unspecified heart failure type (HCC) [I50.9] Aneurysm of ascending aorta without rupture (HCC) [I71.21] Patient Active Problem List   Diagnosis Date Noted   Acute CHF (congestive heart failure) (HCC) 06/04/2023   Thrombocytopenia (HCC) 06/04/2023   Thoracic aortic aneurysm (HCC) 06/04/2023   Incarcerated hernia 05/09/2023   Intestinal obstruction (HCC) 05/09/2023   Upper respiratory infection 05/08/2023   PCP:  Patient, No Pcp Per Pharmacy:   [id number] - [place], [place] - [address] [place] [address] Phone: [phone] Fax: [phone]  [facility] - [place], [place] - [address] [place] [address] Phone: [phone] Fax: [phone]  [facility] [place], [place] - [address] [place] [address] Phone: [phone] Fax: [phone]  Social Drivers of Health (SDOH) Social History: SDOH Screenings   Food Insecurity: No Food Insecurity (06/04/2023)  Housing: Low Risk  (06/04/2023)  Transportation Needs: No Transportation Needs (06/04/2023)  Utilities: Not At Risk (06/04/2023)  Social Connections: Socially Isolated (06/04/2023)  Tobacco Use: Medium Risk (06/03/2023)   SDOH Interventions:     Readmission Risk Interventions    05/09/2023   12:49 PM  Readmission  Risk Prevention Plan  Post Dischage Appt Not Complete  Medication Screening Complete  Transportation Screening Complete

## 2023-06-04 NOTE — ED Provider Notes (Signed)
 Incidental finding of ascending aortic aneurysm found, will need outpatient follow-up   Zadie Rhine, MD 06/04/23 [phone]
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
## 2023-06-04 NOTE — ED Provider Notes (Signed)
 I assumed care at signout to follow-up on imaging and call report.  Patient found to have new onset CHF, but no signs of PE.  Patient is awake alert in no acute distress Nitroglycerin  has been ordered, patient is already been given Lasix .  Discussed with Dr. Adefeso for admission. Patient reports he has done well postoperatively, and no new issues with his hernia site. BP (!) 123/111 (BP Location: Left Arm)   Pulse (!) 109   Temp 97.7 F (36.5 C) (Oral)   Resp (!) 21   Ht 1.829 m (6')   Wt 74.8 kg   SpO2 97%   BMI 22.38 kg/m     Midge Golas, MD 06/04/23 [id number]

## 2023-06-05 DIAGNOSIS — R7989 Other specified abnormal findings of blood chemistry: Secondary | ICD-10-CM | POA: Diagnosis present

## 2023-06-05 DIAGNOSIS — N1831 Chronic kidney disease, stage 3a: Secondary | ICD-10-CM | POA: Diagnosis present

## 2023-06-05 DIAGNOSIS — E538 Deficiency of other specified B group vitamins: Secondary | ICD-10-CM

## 2023-06-05 DIAGNOSIS — I350 Nonrheumatic aortic (valve) stenosis: Secondary | ICD-10-CM

## 2023-06-05 DIAGNOSIS — I5021 Acute systolic (congestive) heart failure: Secondary | ICD-10-CM | POA: Diagnosis present

## 2023-06-05 DIAGNOSIS — I429 Cardiomyopathy, unspecified: Secondary | ICD-10-CM

## 2023-06-05 LAB — BASIC METABOLIC PANEL
Anion gap: 12 (ref 5–15)
BUN: 21 mg/dL (ref 8–23)
CO2: 26 mmol/L (ref 22–32)
Calcium: 9.2 mg/dL (ref 8.9–10.3)
Chloride: 99 mmol/L (ref 98–111)
Creatinine, Ser: 1.39 mg/dL — ABNORMAL HIGH (ref 0.61–1.24)
GFR, Estimated: 56 mL/min — ABNORMAL LOW (ref >60)
Glucose, Bld: 106 mg/dL — ABNORMAL HIGH (ref 70–99)
Potassium: 3.3 mmol/L — ABNORMAL LOW (ref 3.5–5.1)
Sodium: 137 mmol/L (ref 135–145)

## 2023-06-05 MED ORDER — CYANOCOBALAMIN 1000 MCG/ML IJ SOLN
1000.0000 ug | Freq: Every day | INTRAMUSCULAR | Status: AC
  Administered 2023-06-05 – 2023-06-07 (×3): 1000 ug via INTRAMUSCULAR
  Filled 2023-06-05 (×3): qty 1

## 2023-06-05 MED ORDER — POTASSIUM CHLORIDE CRYS ER 20 MEQ PO TBCR
20.0000 meq | EXTENDED_RELEASE_TABLET | Freq: Two times a day (BID) | ORAL | Status: DC
  Administered 2023-06-05 – 2023-06-06 (×4): 20 meq via ORAL
  Filled 2023-06-05 (×4): qty 1

## 2023-06-05 MED ORDER — FOLIC ACID 1 MG PO TABS
1.0000 mg | ORAL_TABLET | Freq: Every day | ORAL | Status: DC
  Administered 2023-06-05 – 2023-06-07 (×3): 1 mg via ORAL
  Filled 2023-06-05 (×3): qty 1

## 2023-06-05 MED ORDER — VITAMIN B-12 1000 MCG PO TABS: 1000.0000 ug | ORAL_TABLET | Freq: Every day | ORAL | Status: DC

## 2023-06-05 NOTE — Progress Notes (Signed)
 Patient has remained a yellow MEWS throughout the shift. His heart rate and respirations is what keeps him yellow. He is resting quietly with eyes closed. He does not appear in any distress.

## 2023-06-05 NOTE — Progress Notes (Signed)
   06/05/23 0501  Assess: MEWS Score  Temp 98.6 F (37 C)  BP 94/75  MAP (mmHg) 83  Pulse Rate (!) 112  SpO2 93 %  O2 Device Room Air  Assess: MEWS Score  MEWS Temp 0  MEWS Systolic 1  MEWS Pulse 2  MEWS RR 0  MEWS LOC 0  MEWS Score 3  MEWS Score Color Yellow  Assess: if the MEWS score is Yellow or Red  Were vital signs accurate and taken at a resting state? Yes  Does the patient meet 2 or more of the SIRS criteria? Yes  Does the patient have a confirmed or suspected source of infection? No  MEWS guidelines implemented  No, previously yellow, continue vital signs every 4 hours  Notify: Charge Nurse/RN  Name of Charge Nurse/RN Notified Metta Sar, RN  Provider Notification  Provider Name/Title Dr. Manfred  Date Provider Notified 06/05/23  Time Provider Notified [phone]  Assess: SIRS CRITERIA  SIRS Temperature  0  SIRS Respirations  0  SIRS Pulse 1  SIRS WBC 0  SIRS Score Sum  1
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
## 2023-06-05 NOTE — Plan of Care (Signed)
   Problem: Education: Goal: Knowledge of General Education information will improve Description: Including pain rating scale, medication(s)/side effects and non-pharmacologic comfort measures Outcome: Not Progressing   Problem: Health Behavior/Discharge Planning: Goal: Ability to manage health-related needs will improve Outcome: Not Progressing

## 2023-06-05 NOTE — Plan of Care (Signed)

## 2023-06-05 NOTE — Progress Notes (Signed)
   06/04/23 2019  Assess: MEWS Score  Temp 98 F (36.7 C)  BP 107/88  MAP (mmHg) 95  Pulse Rate (!) 114  SpO2 98 %  O2 Device Room Air  Assess: MEWS Score  MEWS Temp 0  MEWS Systolic 0  MEWS Pulse 2  MEWS RR 0  MEWS LOC 0  MEWS Score 2  MEWS Score Color Yellow  Assess: if the MEWS score is Yellow or Red  Were vital signs accurate and taken at a resting state? Yes  Does the patient meet 2 or more of the SIRS criteria? Yes  Does the patient have a confirmed or suspected source of infection? No  MEWS guidelines implemented  No, previously yellow, continue vital signs every 4 hours  Notify: Charge Nurse/RN  Name of Charge Nurse/RN Notified Metta Sar, RN  Assess: SIRS CRITERIA  SIRS Temperature  0  SIRS Respirations  0  SIRS Pulse 1  SIRS WBC 0  SIRS Score Sum  1

## 2023-06-06 ENCOUNTER — Other Ambulatory Visit (HOSPITAL_COMMUNITY): Payer: Self-pay

## 2023-06-06 ENCOUNTER — Encounter (HOSPITAL_COMMUNITY): Payer: Self-pay | Admitting: Internal Medicine

## 2023-06-06 DIAGNOSIS — I429 Cardiomyopathy, unspecified: Secondary | ICD-10-CM

## 2023-06-06 LAB — BASIC METABOLIC PANEL
Anion gap: 10 (ref 5–15)
BUN: 26 mg/dL — ABNORMAL HIGH (ref 8–23)
CO2: 27 mmol/L (ref 22–32)
Calcium: 9.5 mg/dL (ref 8.9–10.3)
Chloride: 98 mmol/L (ref 98–111)
Creatinine, Ser: 1.35 mg/dL — ABNORMAL HIGH (ref 0.61–1.24)
GFR, Estimated: 58 mL/min — ABNORMAL LOW (ref >60)
Glucose, Bld: 112 mg/dL — ABNORMAL HIGH (ref 70–99)
Potassium: 3.7 mmol/L (ref 3.5–5.1)
Sodium: 135 mmol/L (ref 135–145)

## 2023-06-06 LAB — CBC
HCT: 39 % (ref 39.0–52.0)
Hemoglobin: 13.2 g/dL (ref 13.0–17.0)
MCH: 31.7 pg (ref 26.0–34.0)
MCHC: 33.8 g/dL (ref 30.0–36.0)
MCV: 93.8 fL (ref 80.0–100.0)
Platelets: 142 10*3/uL — ABNORMAL LOW (ref 150–400)
RBC: 4.16 MIL/uL — ABNORMAL LOW (ref 4.22–5.81)
RDW: 14.2 % (ref 11.5–15.5)
WBC: 5.2 10*3/uL (ref 4.0–10.5)
nRBC: 0 % (ref 0.0–0.2)

## 2023-06-06 NOTE — Progress Notes (Signed)
 PROGRESS NOTE   [name] Daniel  FMW:[id number] DOB: [birth year] DOA: 06/03/2023 PCP: Patient, No Pcp Per   Chief Complaint  Patient presents with   Shortness of Breath   Level of care: Telemetry  Brief Admission History:  68 year old male with no documented chronic medical problems presenting with 3-week history of progressive shortness of breath.  The patient was recently discharged from the hospital after a stay from 05/08/2023 to 05/12/2023 secondary to incarcerated hernia resulting in SBO.  The patient was seen by general surgery at that time and underwent a mesh repair for his hernia.  He was discharged home in stable condition at that time.  However the patient stated that he had some coughing and shortness of breath at that time.  He he was discharged home with azithromycin . He states that over the past 2 to 3 weeks his shortness of breath has progressed.  Initially he has some dyspnea on exertion, but now he is having some shortness of breath at rest.  He denies any fevers, chills, nausea, vomiting, diarrhea, domino pain.  Last bowel movement was on 06/03/2023. He has some chest discomfort primarily with coughing.  There is no exertional chest discomfort.  He does note some orthopnea type symptoms.  He does not weigh himself.  He does not feel that his legs are more edematous than usual.  Because of progressive shortness of breath he presented for further evaluation and treatment. In the ED, the patient was afebrile hemodynamically stable with oxygen saturation 95% room air.  WBC 4.4, hemoglobin 13.5, platelets 124.  Sodium 136, potassium 4.5, bicarbonate 22, serum creatinine 1.26.  AST 20, ALT 12, alkaline phosphatase 62, total bilirubin 1.2.  BNP 3234.  Troponin 91>>91 CTA chest negative for PE.  There is a moderate right pleural effusion, small left pleural effusion.  There is a moderate pericardial effusion.  The patient was given furosemide  20 mg IV x 1.   Assessment and Plan:  Acute  HFrEF -Continue IV furosemide  per cardiology recs -Daily weights -I's and O's -Echo: LVEF 20% with moderate pericardial effusion and severe AS -ReDS daily requested (unable to get an accurate reading) -cardiology planned transfer to [facility] later this week for cath but now pt undecided Filed Weights   06/04/23 0816 06/05/23 0501 06/06/23 0500  Weight: 70.3 kg 67.1 kg 65.7 kg    Intake/Output Summary (Last 24 hours) at 06/06/2023 1233 Last data filed at 06/06/2023 0912 Gross per 24 hour  Intake 1200 ml  Output 900 ml  Net 300 ml   Severe AS  -planned for CT surgery consult when transferred to [facility]   Thrombocytopenia -B12 - 141  -Folate - 5.1 -TSH - 1.029   B12 deficiency - oral and IM replacement ordered  Folic Acid  Deficiency  - oral replacement ordered   Hypokalemia - from diuresis - oral replacement ordered   Sinus tachycardia -Personally reviewed EKG--sinus tachycardia, nonspecific ST changes -Monitor on telemetry -Echocardiogram -CTA chest negative for PE -COVID-19 PCR negative -TSH--1.029 -amiodarone per cardiology if developed SVT   Ascending aortic aneurysm -Incidental finding on CTA chest -Continue outpatient surveillance   DVT prophylaxis: enoxaparin   Code Status: Full  Family Communication:  Disposition: TBD    Consultants:  Cardiology  Procedures:   Antimicrobials:    Subjective: Says he didn't sleep much last night.  Now says he is undecided about cath   Objective: Vitals:   06/06/23 0130 06/06/23 0500 06/06/23 0510 06/06/23 1000  BP: 101/85  95/80 (!) 84/71  Pulse: (!) 101   (!) 109  Resp:   18   Temp: 98 F (36.7 C)  97.7 F (36.5 C) 98.3 F (36.8 C)  TempSrc: Oral   Oral  SpO2: 95%  93% 94%  Weight:  65.7 kg    Height:        Intake/Output Summary (Last 24 hours) at 06/06/2023 1233 Last data filed at 06/06/2023 0912 Gross per 24 hour  Intake 1200 ml  Output 900 ml  Net 300 ml   Filed Weights   06/04/23 0816 06/05/23 0501 06/06/23  0500  Weight: 70.3 kg 67.1 kg 65.7 kg   Examination:  General exam: Appears calm and comfortable  Respiratory system: bibasilar crackles heard.  Cardiovascular system: tachycardic rate; normal S1 & S2 heard. No JVD, murmurs, rubs, gallops or clicks. No pedal edema. Gastrointestinal system: Abdomen is nondistended, soft and nontender. No organomegaly or masses felt. Normal bowel sounds heard. Central nervous system: Alert and oriented. No focal neurological deficits. Extremities: Symmetric 5 x 5 power. Skin: No rashes, lesions or ulcers. Psychiatry: Judgement and insight appear normal. Mood & affect appropriate.   Data Reviewed: I have personally reviewed following labs and imaging studies  CBC: Recent Labs  Lab 06/03/23 2152 06/06/23 0438  WBC 4.4 5.2  NEUTROABS 2.6  --   HGB 13.5 13.2  HCT 40.5 39.0  MCV 94.8 93.8  PLT 124* 142*    Basic Metabolic Panel: Recent Labs  Lab 06/03/23 2152 06/04/23 1516 06/05/23 0411 06/06/23 0438  NA 136 133* 137 135  K 4.5 3.9 3.3* 3.7  CL 104 101 99 98  CO2 22 21* 26 27  GLUCOSE 122* 123* 106* 112*  BUN 17 20 21  26*  CREATININE 1.26* 1.41* 1.39* 1.35*  CALCIUM  9.4 9.3 9.2 9.5  MG  --  2.2  --   --     CBG: No results for input(s): GLUCAP in the last 168 hours.  Recent Results (from the past 240 hours)  Resp panel by RT-PCR (RSV, Flu A&B, Covid) Anterior Nasal Swab     Status: None   Collection Time: 06/03/23  9:52 PM   Specimen: Anterior Nasal Swab  Result Value Ref Range Status   SARS Coronavirus 2 by RT PCR NEGATIVE NEGATIVE Final    Comment: (NOTE) SARS-CoV-2 target nucleic acids are NOT DETECTED.  The SARS-CoV-2 RNA is generally detectable in upper respiratory specimens during the acute phase of infection. The lowest concentration of SARS-CoV-2 viral copies this assay can detect is 138 copies/mL. A negative result does not preclude SARS-Cov-2 infection and should not be used as the sole basis for treatment or other  patient management decisions. A negative result may occur with  improper specimen collection/handling, submission of specimen other than nasopharyngeal swab, presence of viral mutation(s) within the areas targeted by this assay, and inadequate number of viral copies(<138 copies/mL). A negative result must be combined with clinical observations, patient history, and epidemiological information. The expected result is Negative.  Fact Sheet for Patients:  bloggercourse.com  Fact Sheet for Healthcare Providers:  seriousbroker.it  This test is no t yet approved or cleared by the [place]  FDA and  has been authorized for detection and/or diagnosis of SARS-CoV-2 by FDA under an Emergency Use Authorization (EUA). This EUA will remain  in effect (meaning this test can be used) for the duration of the COVID-19 declaration under Section 564(b)(1) of the Act, 21 U.S.C.section 360bbb-3(b)(1), unless the authorization is terminated  or revoked sooner.  Influenza A by PCR NEGATIVE NEGATIVE Final   Influenza B by PCR NEGATIVE NEGATIVE Final    Comment: (NOTE) The Xpert Xpress SARS-CoV-2/FLU/RSV plus assay is intended as an aid in the diagnosis of influenza from Nasopharyngeal swab specimens and should not be used as a sole basis for treatment. Nasal washings and aspirates are unacceptable for Xpert Xpress SARS-CoV-2/FLU/RSV testing.  Fact Sheet for Patients: bloggercourse.com  Fact Sheet for Healthcare Providers: seriousbroker.it  This test is not yet approved or cleared by the [place]  FDA and has been authorized for detection and/or diagnosis of SARS-CoV-2 by FDA under an Emergency Use Authorization (EUA). This EUA will remain in effect (meaning this test can be used) for the duration of the COVID-19 declaration under Section 564(b)(1) of the Act, 21 U.S.C. section  360bbb-3(b)(1), unless the authorization is terminated or revoked.     Resp Syncytial Virus by PCR NEGATIVE NEGATIVE Final    Comment: (NOTE) Fact Sheet for Patients: bloggercourse.com  Fact Sheet for Healthcare Providers: seriousbroker.it  This test is not yet approved or cleared by the [place]  FDA and has been authorized for detection and/or diagnosis of SARS-CoV-2 by FDA under an Emergency Use Authorization (EUA). This EUA will remain in effect (meaning this test can be used) for the duration of the COVID-19 declaration under Section 564(b)(1) of the Act, 21 U.S.C. section 360bbb-3(b)(1), unless the authorization is terminated or revoked.  Performed at [facility], [address], [place], [place] [address]      Radiology Studies: ECHOCARDIOGRAM COMPLETE Result Date: 06/04/2023    ECHOCARDIOGRAM REPORT   Patient Name:   [name] Daniel Date of Exam: 06/04/2023 Medical Rec #:  [id number]      Height:       72.0 in Accession #:    [id number]     Weight:       155.0 lb Date of Birth:  08-21-1955      BSA:          1.912 m Patient Age:    67 years       BP:           120/103 mmHg Patient Gender: M              HR:           115 bpm. Exam Location:  [facility] Procedure: 2D Echo, Cardiac Doppler, Color Doppler and Intracardiac            Opacification Agent Indications:    CHF-Acute Diastolic I50.31  History:        Patient has no prior history of Echocardiogram examinations.                 CHF. Thoracic aortic aneurysm (HCC).  Sonographer:    Aida Pizza RCS Referring Phys: [id number] DAVID TAT IMPRESSIONS  1. Left ventricular ejection fraction, by estimation, is 20%. The left ventricle has severely decreased function. The left ventricle demonstrates global hypokinesis. There is mild left ventricular hypertrophy. Left ventricular diastolic parameters are indeterminate.  2. Right ventricular systolic function is mildly reduced. The right  ventricular size is normal. There is mildly elevated pulmonary artery systolic pressure.  3. Left atrial size was mildly dilated.  4. Right atrial size was mildly dilated.  5. Mild to moderate. The pericardial effusion is localized near the right atrium.  6. The mitral valve is abnormal. Mild mitral valve regurgitation. No evidence of mitral stenosis.  7. The tricuspid valve is abnormal.  8. Severe  low flow low gradient aortic stenosis. AVA VTI 0.49, mean gradient 29 mmHg DI 0.16. Highest Pedhoff mean gradient is 35 mmHg in setting of ectopy leading to some beat to beat variation in gradient. . The aortic valve has an indeterminant number of cusps. There is severe calcifcation of the aortic valve. There is severe thickening of the aortic valve. Aortic valve regurgitation is not visualized.  9. The inferior vena cava is dilated in size with >50% respiratory variability, suggesting right atrial pressure of 8 mmHg. FINDINGS  Left Ventricle: Left ventricular ejection fraction, by estimation, is 20%. The left ventricle has severely decreased function. The left ventricle demonstrates global hypokinesis. Definity  contrast agent was given IV to delineate the left ventricular endocardial borders. The left ventricular internal cavity size was normal in size. There is mild left ventricular hypertrophy. Left ventricular diastolic parameters are indeterminate. Right Ventricle: The right ventricular size is normal. Right vetricular wall thickness was not well visualized. Right ventricular systolic function is mildly reduced. There is mildly elevated pulmonary artery systolic pressure. The tricuspid regurgitant velocity is 2.65 m/s, and with an assumed right atrial pressure of 8 mmHg, the estimated right ventricular systolic pressure is 36.1 mmHg. Left Atrium: Left atrial size was mildly dilated. Right Atrium: Right atrial size was mildly dilated. Pericardium: Mild to moderate. The pericardial effusion is localized near the right  atrium. Mitral Valve: The mitral valve is abnormal. There is mild thickening of the mitral valve leaflet(s). There is mild calcification of the mitral valve leaflet(s). Mild mitral annular calcification. Mild mitral valve regurgitation. No evidence of mitral valve stenosis. MV peak gradient, 7.4 mmHg. The mean mitral valve gradient is 2.0 mmHg. Tricuspid Valve: The tricuspid valve is abnormal. Tricuspid valve regurgitation is mild . No evidence of tricuspid stenosis. Aortic Valve: Severe low flow low gradient aortic stenosis. AVA VTI 0.49, mean gradient 29 mmHg DI 0.16. Highest Pedhoff mean gradient is 35 mmHg in setting of ectopy leading to some beat to beat variation in gradient. The aortic valve has an indeterminant number of cusps. There is severe calcifcation of the aortic valve. There is severe thickening of the aortic valve. There is severe aortic valve annular calcification. Aortic valve regurgitation is not visualized. Aortic valve mean gradient measures 24.0 mmHg. Aortic valve peak gradient measures 39.8 mmHg. Aortic valve area, by VTI measures 0.49 cm. Pulmonic Valve: The pulmonic valve was not well visualized. Pulmonic valve regurgitation is not visualized. No evidence of pulmonic stenosis. Aorta: The aortic root is normal in size and structure. Venous: The inferior vena cava is dilated in size with greater than 50% respiratory variability, suggesting right atrial pressure of 8 mmHg. IAS/Shunts: No atrial level shunt detected by color flow Doppler.  LEFT VENTRICLE PLAX 2D LVIDd:         5.60 cm LVIDs:         5.20 cm LV PW:         1.20 cm LV IVS:        1.10 cm LVOT diam:     2.00 cm      3D Volume EF: LV SV:         26           3D EF:        22 % LV SV Index:   13           LV EDV:       280 ml LVOT Area:     3.14 cm  LV ESV:       218 ml                             LV SV:        62 ml  LV Volumes (MOD) LV vol d, MOD A2C: 195.0 ml LV vol d, MOD A4C: 202.0 ml LV vol s, MOD A2C: 152.0 ml LV vol s,  MOD A4C: 158.0 ml LV SV MOD A2C:     43.0 ml LV SV MOD A4C:     202.0 ml LV SV MOD BP:      42.5 ml RIGHT VENTRICLE TAPSE (M-mode): 1.7 cm LEFT ATRIUM             Index        RIGHT ATRIUM           Index LA diam:        4.50 cm 2.35 cm/m   RA Area:     30.00 cm LA Vol (A2C):   83.9 ml 43.89 ml/m  RA Volume:   121.00 ml 63.29 ml/m LA Vol (A4C):   65.6 ml 34.31 ml/m LA Biplane Vol: 77.5 ml 40.54 ml/m  AORTIC VALVE AV Area (Vmax):    0.39 cm AV Area (Vmean):   0.39 cm AV Area (VTI):     0.49 cm AV Vmax:           315.33 cm/s AV Vmean:          224.333 cm/s AV VTI:            0.522 m AV Peak Grad:      39.8 mmHg AV Mean Grad:      24.0 mmHg LVOT Vmax:         39.60 cm/s LVOT Vmean:        27.700 cm/s LVOT VTI:          0.081 m LVOT/AV VTI ratio: 0.16  AORTA Ao Root diam: 3.80 cm MITRAL VALVE                TRICUSPID VALVE MV Area (PHT): 9.37 cm     TR Peak grad:   28.1 mmHg MV Area VTI:   0.77 cm     TR Vmax:        265.00 cm/s MV Peak grad:  7.4 mmHg MV Mean grad:  2.0 mmHg     SHUNTS MV Vmax:       1.36 m/s     Systemic VTI:  0.08 m MV Vmean:      53.7 cm/s    Systemic Diam: 2.00 cm MV Decel Time: 81 msec MR Peak grad: 105.3 mmHg MR Mean grad: 61.0 mmHg MR Vmax:      513.00 cm/s MR Vmean:     357.0 cm/s MV E velocity: 142.00 cm/s Dorn Ross MD Electronically signed by Dorn Ross MD Signature Date/Time: 06/04/2023/1:30:21 PM    Final     Scheduled Meds:  cyanocobalamin   1,000 mcg Intramuscular Daily   Followed by   NOREEN ON 06/08/2023] vitamin B-12  1,000 mcg Oral Daily   enoxaparin  (LOVENOX ) injection  40 mg Subcutaneous Q24H   folic acid   1 mg Oral Daily   furosemide   20 mg Intravenous BID   potassium chloride   20 mEq Oral BID   Continuous Infusions:   LOS: 2 days   Time spent: 58 mins  Ninfa Giannelli Vicci, MD How to contact the [facility] Attending  or Consulting provider 7A - 7P or covering provider during after hours 7P -7A, for this patient?  Check the care team in [facility] and look for  a) attending/consulting [facility] provider listed and b) the [facility] team listed Log into [email or web address] to find provider on call.  Locate the [facility] provider you are looking for under [facility] and page to a number that you can be directly reached. If you still have difficulty reaching the provider, please page the [facility] (Director on Call) for the Hospitalists listed on amion for assistance.  06/06/2023, 12:33 PM

## 2023-06-06 NOTE — Progress Notes (Signed)
   06/06/23 1313  Vitals  BP (!) 85/71  MAP (mmHg) 78  Pulse Rate (!) 111  Level of Consciousness  Level of Consciousness Alert  MEWS COLOR  MEWS Score Color Yellow  Oxygen Therapy  SpO2 98 %  O2 Device Room Air  MEWS Score  MEWS Temp 0  MEWS Systolic 1  MEWS Pulse 2  MEWS RR 0  MEWS LOC 0  MEWS Score 3   MD Johnson aware

## 2023-06-06 NOTE — Plan of Care (Signed)
  Problem: Clinical Measurements: Goal: Will remain free from infection Outcome: Progressing Goal: Respiratory complications will improve Outcome: Progressing   Problem: Nutrition: Goal: Adequate nutrition will be maintained Outcome: Progressing   

## 2023-06-06 NOTE — Progress Notes (Signed)
 Rounding Note    Patient Name: [name] Daniel Date of Encounter: 06/06/2023  [facility] Cardiologist: New  Subjective   No complaints  Inpatient Medications    Scheduled Meds:  cyanocobalamin   1,000 mcg Intramuscular Daily   Followed by   NOREEN ON 06/08/2023] vitamin B-12  1,000 mcg Oral Daily   enoxaparin  (LOVENOX ) injection  40 mg Subcutaneous Q24H   folic acid   1 mg Oral Daily   furosemide   20 mg Intravenous BID   potassium chloride   20 mEq Oral BID   Continuous Infusions:  PRN Meds: acetaminophen , ondansetron  (ZOFRAN ) IV   Vital Signs    Vitals:   06/05/23 2159 06/06/23 0130 06/06/23 0500 06/06/23 0510  BP: 93/81 101/85  95/80  Pulse: (!) 110 (!) 101    Resp: (!) 24   18  Temp: 97.8 F (36.6 C) 98 F (36.7 C)  97.7 F (36.5 C)  TempSrc:  Oral    SpO2: 94% 95%  93%  Weight:   65.7 kg   Height:        Intake/Output Summary (Last 24 hours) at 06/06/2023 0924 Last data filed at 06/06/2023 0912 Gross per 24 hour  Intake 1440 ml  Output 1300 ml  Net 140 ml      06/06/2023    5:00 AM 06/05/2023    5:01 AM 06/04/2023    8:16 AM  Last 3 Weights  Weight (lbs) 144 lb 13.5 oz 147 lb 14.9 oz 154 lb 15.7 oz  Weight (kg) 65.7 kg 67.1 kg 70.3 kg      Telemetry    Sinus tach - Personally Reviewed  ECG    N/a - Personally Reviewed  Physical Exam   GEN: No acute distress.   Neck: No JVD Cardiac: RRR, 3/6 systolic murmur rusb Respiratory: Clear to auscultation bilaterally. GI: Soft, nontender, non-distended  MS: trace bilateral edema. Neuro:  Nonfocal  Psych: Normal affect   Labs    High Sensitivity Troponin:   Recent Labs  Lab 06/03/23 2152 06/03/23 2343  TROPONINIHS 91* 91*     Chemistry Recent Labs  Lab 06/03/23 2152 06/04/23 1516 06/05/23 0411 06/06/23 0438  NA 136 133* 137 135  K 4.5 3.9 3.3* 3.7  CL 104 101 99 98  CO2 22 21* 26 27  GLUCOSE 122* 123* 106* 112*  BUN 17 20 21  26*  CREATININE 1.26* 1.41* 1.39* 1.35*   CALCIUM  9.4 9.3 9.2 9.5  MG  --  2.2  --   --   PROT 7.4  --   --   --   ALBUMIN  4.1  --   --   --   AST 20  --   --   --   ALT 12  --   --   --   ALKPHOS 62  --   --   --   BILITOT 1.2  --   --   --   GFRNONAA >60 55* 56* 58*  ANIONGAP 10 11 12 10     Lipids No results for input(s): CHOL, TRIG, HDL, LABVLDL, LDLCALC, CHOLHDL in the last 168 hours.  Hematology Recent Labs  Lab 06/03/23 2152 06/06/23 0438  WBC 4.4 5.2  RBC 4.27 4.16*  HGB 13.5 13.2  HCT 40.5 39.0  MCV 94.8 93.8  MCH 31.6 31.7  MCHC 33.3 33.8  RDW 14.3 14.2  PLT 124* 142*   Thyroid  Recent Labs  Lab 06/03/23 2342  TSH 1.029  FREET4 1.20*  BNP Recent Labs  Lab 06/03/23 2152  BNP 3,234.0*    DDimer  Recent Labs  Lab 06/03/23 2152  DDIMER 1.82*     Radiology    ECHOCARDIOGRAM COMPLETE Result Date: 06/04/2023    ECHOCARDIOGRAM REPORT   Patient Name:   [name] Daniel Date of Exam: 06/04/2023 Medical Rec #:  [id number]      Height:       72.0 in Accession #:    [id number]     Weight:       155.0 lb Date of Birth:  12-24-1955      BSA:          1.912 m Patient Age:    67 years       BP:           120/103 mmHg Patient Gender: M              HR:           115 bpm. Exam Location:  [facility] Procedure: 2D Echo, Cardiac Doppler, Color Doppler and Intracardiac            Opacification Agent Indications:    CHF-Acute Diastolic I50.31  History:        Patient has no prior history of Echocardiogram examinations.                 CHF. Thoracic aortic aneurysm (HCC).  Sonographer:    Aida Pizza RCS Referring Phys: [phone] DAVID TAT IMPRESSIONS  1. Left ventricular ejection fraction, by estimation, is 20%. The left ventricle has severely decreased function. The left ventricle demonstrates global hypokinesis. There is mild left ventricular hypertrophy. Left ventricular diastolic parameters are indeterminate.  2. Right ventricular systolic function is mildly reduced. The right ventricular size is normal.  There is mildly elevated pulmonary artery systolic pressure.  3. Left atrial size was mildly dilated.  4. Right atrial size was mildly dilated.  5. Mild to moderate. The pericardial effusion is localized near the right atrium.  6. The mitral valve is abnormal. Mild mitral valve regurgitation. No evidence of mitral stenosis.  7. The tricuspid valve is abnormal.  8. Severe low flow low gradient aortic stenosis. AVA VTI 0.49, mean gradient 29 mmHg DI 0.16. Highest Pedhoff mean gradient is 35 mmHg in setting of ectopy leading to some beat to beat variation in gradient. . The aortic valve has an indeterminant number of cusps. There is severe calcifcation of the aortic valve. There is severe thickening of the aortic valve. Aortic valve regurgitation is not visualized.  9. The inferior vena cava is dilated in size with >50% respiratory variability, suggesting right atrial pressure of 8 mmHg. FINDINGS  Left Ventricle: Left ventricular ejection fraction, by estimation, is 20%. The left ventricle has severely decreased function. The left ventricle demonstrates global hypokinesis. Definity  contrast agent was given IV to delineate the left ventricular endocardial borders. The left ventricular internal cavity size was normal in size. There is mild left ventricular hypertrophy. Left ventricular diastolic parameters are indeterminate. Right Ventricle: The right ventricular size is normal. Right vetricular wall thickness was not well visualized. Right ventricular systolic function is mildly reduced. There is mildly elevated pulmonary artery systolic pressure. The tricuspid regurgitant velocity is 2.65 m/s, and with an assumed right atrial pressure of 8 mmHg, the estimated right ventricular systolic pressure is 36.1 mmHg. Left Atrium: Left atrial size was mildly dilated. Right Atrium: Right atrial size was mildly dilated. Pericardium: Mild to moderate. The pericardial effusion is  localized near the right atrium. Mitral Valve: The  mitral valve is abnormal. There is mild thickening of the mitral valve leaflet(s). There is mild calcification of the mitral valve leaflet(s). Mild mitral annular calcification. Mild mitral valve regurgitation. No evidence of mitral valve stenosis. MV peak gradient, 7.4 mmHg. The mean mitral valve gradient is 2.0 mmHg. Tricuspid Valve: The tricuspid valve is abnormal. Tricuspid valve regurgitation is mild . No evidence of tricuspid stenosis. Aortic Valve: Severe low flow low gradient aortic stenosis. AVA VTI 0.49, mean gradient 29 mmHg DI 0.16. Highest Pedhoff mean gradient is 35 mmHg in setting of ectopy leading to some beat to beat variation in gradient. The aortic valve has an indeterminant number of cusps. There is severe calcifcation of the aortic valve. There is severe thickening of the aortic valve. There is severe aortic valve annular calcification. Aortic valve regurgitation is not visualized. Aortic valve mean gradient measures 24.0 mmHg. Aortic valve peak gradient measures 39.8 mmHg. Aortic valve area, by VTI measures 0.49 cm. Pulmonic Valve: The pulmonic valve was not well visualized. Pulmonic valve regurgitation is not visualized. No evidence of pulmonic stenosis. Aorta: The aortic root is normal in size and structure. Venous: The inferior vena cava is dilated in size with greater than 50% respiratory variability, suggesting right atrial pressure of 8 mmHg. IAS/Shunts: No atrial level shunt detected by color flow Doppler.  LEFT VENTRICLE PLAX 2D LVIDd:         5.60 cm LVIDs:         5.20 cm LV PW:         1.20 cm LV IVS:        1.10 cm LVOT diam:     2.00 cm      3D Volume EF: LV SV:         26           3D EF:        22 % LV SV Index:   13           LV EDV:       280 ml LVOT Area:     3.14 cm     LV ESV:       218 ml                             LV SV:        62 ml  LV Volumes (MOD) LV vol d, MOD A2C: 195.0 ml LV vol d, MOD A4C: 202.0 ml LV vol s, MOD A2C: 152.0 ml LV vol s, MOD A4C: 158.0 ml LV SV  MOD A2C:     43.0 ml LV SV MOD A4C:     202.0 ml LV SV MOD BP:      42.5 ml RIGHT VENTRICLE TAPSE (M-mode): 1.7 cm LEFT ATRIUM             Index        RIGHT ATRIUM           Index LA diam:        4.50 cm 2.35 cm/m   RA Area:     30.00 cm LA Vol (A2C):   83.9 ml 43.89 ml/m  RA Volume:   121.00 ml 63.29 ml/m LA Vol (A4C):   65.6 ml 34.31 ml/m LA Biplane Vol: 77.5 ml 40.54 ml/m  AORTIC VALVE AV Area (Vmax):    0.39 cm AV Area (Vmean):   0.39  cm AV Area (VTI):     0.49 cm AV Vmax:           315.33 cm/s AV Vmean:          224.333 cm/s AV VTI:            0.522 m AV Peak Grad:      39.8 mmHg AV Mean Grad:      24.0 mmHg LVOT Vmax:         39.60 cm/s LVOT Vmean:        27.700 cm/s LVOT VTI:          0.081 m LVOT/AV VTI ratio: 0.16  AORTA Ao Root diam: 3.80 cm MITRAL VALVE                TRICUSPID VALVE MV Area (PHT): 9.37 cm     TR Peak grad:   28.1 mmHg MV Area VTI:   0.77 cm     TR Vmax:        265.00 cm/s MV Peak grad:  7.4 mmHg MV Mean grad:  2.0 mmHg     SHUNTS MV Vmax:       1.36 m/s     Systemic VTI:  0.08 m MV Vmean:      53.7 cm/s    Systemic Diam: 2.00 cm MV Decel Time: 81 msec MR Peak grad: 105.3 mmHg MR Mean grad: 61.0 mmHg MR Vmax:      513.00 cm/s MR Vmean:     357.0 cm/s MV E velocity: 142.00 cm/s Dorn Ross MD Electronically signed by Dorn Ross MD Signature Date/Time: 06/04/2023/1:30:21 PM    Final     Cardiac Studies     Patient Profile     ABDULHAMID OLGIN is a 68 y.o. male with little know medical history as he does not see doctors who is being seen 06/04/2023 for the evaluation of shortness of breath at the request of Dr Tat.   Assessment & Plan    1.Acute HFrEF - - Echo: LVEF 20%, severe low flow low gradient AS mean gradient 29 AVA VTI 0.49 DI 0.16 - BNP 3234, CT moderate HF  - I/Os incomplete, negative roughly 3.3 L since admission. He is on IV lasix  20mg  bid. Mild variation in Cr without clear trend. Weights if accurate down 10 lbs since admission.  - appears  near euvolemic. D/c IV lasix  after tonights dose, f/u RHC filling pressures   - hold on beta blocker given remains acutely decompensated. Sinus tach raises questions about possible low output as well, cautious use of beta blocker. SBP 90-100 this afternoon, hold on ACE/ARB/ARNI/MRA. Hold SGLT2i given likely invasive procedures this admission.  - likely etiology is his severe aortic stenosis.   - we discussed cath, he wants time to think about it. Will check back later in the day.      2. Severe aortic stenosis - new diagnosis this admission - Echo: LVEF 20%, severe low flow low gradient AS mean gradient 29 AVA VTI 0.49 DI 0.16. Heavily calcified valve morphologically. Pedhoff mean gradient in setting of ectopy up to 35 mmHg.  - Aortopathy and severe AS, not able to discern by imaging if possibly bicuspid valve or not.    - overall severe symptomatic AS presenting with heart failure and severe LV systolic dysfunction. With coexisting aortic aneurysm would need to consider surgical options first after initial workup is complete - would plan for RHC/LHC once patient is euvolemic. After cath would likely need  CT surgery evaluation given severe aortic stenosis and an aortic aneurysm  - we discussed cath, he wants time to think about it. Will check back later in the day.      3.Ascending aortic aneurysm - 5cm by CT PE. Aortopathy and severe AS, not able to discern by imaging if possibly bicuspid valve or not.    4. Tachycardia - sinus tach, also can have change in P wave morphology consistent with ectopic atach slightly faster than his sinus tach - avoiding beta blockers for now as reported above, likely start when more euvolemic. If sustained SVT would start amiodarone.     For questions or updates, please contact [facility] Please consult [email or web address] for contact info under        Signed, Alvan Carrier, MD  06/06/2023, 9:24 AM

## 2023-06-07 LAB — BASIC METABOLIC PANEL WITH GFR
Anion gap: 8 (ref 5–15)
BUN: 26 mg/dL — ABNORMAL HIGH (ref 8–23)
CO2: 24 mmol/L (ref 22–32)
Calcium: 9.2 mg/dL (ref 8.9–10.3)
Chloride: 102 mmol/L (ref 98–111)
Creatinine, Ser: 1.15 mg/dL (ref 0.61–1.24)
GFR, Estimated: >60 mL/min
Glucose, Bld: 109 mg/dL — ABNORMAL HIGH (ref 70–99)
Potassium: 4.1 mmol/L (ref 3.5–5.1)
Sodium: 134 mmol/L — ABNORMAL LOW (ref 135–145)

## 2023-06-07 LAB — PHOSPHORUS: Phosphorus: 3.7 mg/dL (ref 2.5–4.6)

## 2023-06-07 LAB — MAGNESIUM: Magnesium: 2.2 mg/dL (ref 1.7–2.4)

## 2023-06-07 MED ORDER — MUPIROCIN 2 % EX OINT: 1.0000 | TOPICAL_OINTMENT | Freq: Two times a day (BID) | CUTANEOUS | Status: DC

## 2023-06-07 MED ORDER — CYANOCOBALAMIN 1000 MCG PO TABS: 1000.0000 ug | ORAL_TABLET | Freq: Every day | ORAL | 1 refills | Status: AC

## 2023-06-07 MED ORDER — POTASSIUM CHLORIDE CRYS ER 10 MEQ PO TBCR: 10.0000 meq | EXTENDED_RELEASE_TABLET | Freq: Every day | ORAL | 1 refills | Status: DC

## 2023-06-07 MED ORDER — ATENOLOL 25 MG PO TABS: 12.5000 mg | ORAL_TABLET | Freq: Every day | ORAL | 1 refills | Status: DC

## 2023-06-07 MED ORDER — HYDROXYZINE HCL 25 MG PO TABS
25.0000 mg | ORAL_TABLET | Freq: Three times a day (TID) | ORAL | Status: DC | PRN
  Administered 2023-06-07: 25 mg via ORAL
  Filled 2023-06-07: qty 1

## 2023-06-07 MED ORDER — CHLORHEXIDINE GLUCONATE CLOTH 2 % EX PADS: 6.0000 | MEDICATED_PAD | Freq: Every day | CUTANEOUS | Status: DC

## 2023-06-07 MED ORDER — FUROSEMIDE 20 MG PO TABS: 20.0000 mg | ORAL_TABLET | Freq: Every day | ORAL | 1 refills | Status: DC

## 2023-06-07 MED ORDER — POTASSIUM CHLORIDE CRYS ER 10 MEQ PO TBCR: 10.0000 meq | EXTENDED_RELEASE_TABLET | Freq: Every day | ORAL | Status: DC

## 2023-06-07 MED ORDER — FOLIC ACID 1 MG PO TABS: 1.0000 mg | ORAL_TABLET | Freq: Every day | ORAL | 1 refills | Status: DC

## 2023-06-07 NOTE — Progress Notes (Signed)
   06/07/23 0000  Assess: MEWS Score  Temp 98.1 F (36.7 C)  BP 99/82  MAP (mmHg) 90  Pulse Rate (!) 108  ECG Heart Rate (!) 110  Resp (!) 24  Level of Consciousness Alert  SpO2 99 %  O2 Device Room Air  Assess: MEWS Score  MEWS Temp 0  MEWS Systolic 1  MEWS Pulse 1  MEWS RR 1  MEWS LOC 0  MEWS Score 3  MEWS Score Color Yellow  Assess: if the MEWS score is Yellow or Red  Were vital signs accurate and taken at a resting state? Yes  Does the patient meet 2 or more of the SIRS criteria? Yes  Does the patient have a confirmed or suspected source of infection? No  MEWS guidelines implemented  No, previously yellow, continue vital signs every 4 hours  Notify: Charge Nurse/RN  Name of Charge Nurse/RN Notified Isaiah Benders RN  Provider Notification  Provider Name/Title Dorn Dawson MD  Date Provider Notified 06/07/23  Time Provider Notified 0017  Method of Notification  (Secure Chat)  Notification Reason Other (Comment) (MEWS; red/yellow)  Provider response See new orders  Date of Provider Response 06/07/23  Time of Provider Response 0017  Assess: SIRS CRITERIA  SIRS Temperature  0  SIRS Respirations  1  SIRS Pulse 1  SIRS WBC 0  SIRS Score Sum  2     Patient alert and oriented x4. Sitting on the edge of the bed. No complaints of pain at this time. Patient stated he is having some anxiety. MD Dorn Dawson made aware.

## 2023-06-07 NOTE — Progress Notes (Signed)
 Cardiologist:  DOROTHA Ross  Subjective:  Denies SSCP, palpitations or Dyspnea Upset that he was made NPO Still does not want to have heart cath   Objective:  Vitals:   06/07/23 0000 06/07/23 0111 06/07/23 0500 06/07/23 0512  BP: 99/82 (!) 102/91  98/79  Pulse: (!) 108 (!) 110  (!) 109  Resp: (!) 24 20  20   Temp: 98.1 F (36.7 C) 97.7 F (36.5 C)  98.1 F (36.7 C)  TempSrc: Oral Oral  Oral  SpO2: 99% 93%  96%  Weight:   66.4 kg   Height:        Intake/Output from previous day:  Intake/Output Summary (Last 24 hours) at 06/07/2023 9187 Last data filed at 06/06/2023 2200 Gross per 24 hour  Intake 1200 ml  Output 100 ml  Net 1100 ml    Physical Exam:  Thin male Looks older than stated age S1/S2 AS murmur Basilar crackles  No edema Palpable pedal pulses   Lab Results: Basic Metabolic Panel: Recent Labs    06/04/23 1516 06/05/23 0411 06/06/23 0438 06/07/23 0431  NA 133*   < > 135 134*  K 3.9   < > 3.7 4.1  CL 101   < > 98 102  CO2 21*   < > 27 24  GLUCOSE 123*   < > 112* 109*  BUN 20   < > 26* 26*  CREATININE 1.41*   < > 1.35* 1.15  CALCIUM  9.3   < > 9.5 9.2  MG 2.2  --   --  2.2  PHOS  --   --   --  3.7   < > = values in this interval not displayed.   Liver Function Tests: No results for input(s): AST, ALT, ALKPHOS, BILITOT, PROT, ALBUMIN  in the last 72 hours. No results for input(s): LIPASE, AMYLASE in the last 72 hours. CBC: Recent Labs    06/06/23 0438  WBC 5.2  HGB 13.2  HCT 39.0  MCV 93.8  PLT 142*     Imaging: No results found.  Cardiac Studies:  ECG: ST no acute changes    Telemetry:  Echo: EF 20% severe low flow AS mean gradient 29 mmHg  Medications:    cyanocobalamin   1,000 mcg Intramuscular Daily   Followed by   NOREEN ON 06/08/2023] vitamin B-12  1,000 mcg Oral Daily   enoxaparin  (LOVENOX ) injection  40 mg Subcutaneous Q24H   folic acid   1 mg Oral Daily   [START ON 06/08/2023] potassium chloride   10 mEq Oral  Daily      Assessment/Plan:    1.Acute HFrEF - - Echo: LVEF 20%, severe low flow low gradient AS mean gradient 29 AVA VTI 0.49 DI 0.16 - BNP 3234, CT moderate HF - started on IV lasix  20mg  bid, fist dose late last night. Good urine ouput with 1.6L out after single IV lasix  20mg  last night. Continue lasix  20mg  bid dosing. - K 4.1 Cr 1.15 this am - He does not want to be transferred to [facility] for cath at this time     - hold on beta blocker given remains acutely decompensated. Sinus tach raises questions about possible low output as well, cautious use of beta blocker. SBP 90-100 this afternoon, hold on ACE/ARB/ARNI/MRA. Hold SGLT2i given likely invasive procedures this admission.  - likely etiology is his severe aortic stenosis.    2. Severe aortic stenosis - new diagnosis - Echo: LVEF 20%, severe low flow low gradient AS mean gradient 29  AVA VTI 0.49 DI 0.16. Heavily calcified valve morphologically. Pedhoff mean gradient in setting of ectopy up to 35 mmHg.  - Aortopathy and severe AS, not able to discern by imaging if possibly bicuspid valve or not.    - overall severe symptomatic AS presenting with heart failure and severe LV systolic dysfunction. With coexisting aortic aneurysm would need to consider surgical options first after initial workup is complete - would plan for RHC/LHC if patient consents      3.Ascending aortic aneurysm - 5cm by CT PE. Aortopathy and severe AS, not able to discern by imaging if possibly bicuspid valve or not.    4. Tachycardia - sinus tach, also can have change in P wave morphology consistent with ectopic atach slightly faster than his sinus tach - avoiding beta blockers for now as reported above, likely start when more euvolemic. If sustained SVT would start amiodarone.  - follow telemetry to r/o flutter   Patient want to have breakfast and declines transfer to [facility]  Maude Emmer 06/07/2023, 8:12 AM

## 2023-06-07 NOTE — Progress Notes (Signed)
 Patient has been awake all night, unable to sleep.

## 2023-06-07 NOTE — Discharge Summary (Addendum)
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 or cleared by the [place]  FDA and has been authorized for detection and/or diagnosis of SARS-CoV-2 by FDA under an Emergency Use Authorization (EUA). This EUA will remain in effect (meaning this test can be used) for the duration of the COVID-19 declaration under Section 564(b)(1) of the Act, 21 U.S.C. section 360bbb-3(b)(1), unless the authorization is terminated or revoked.     Resp Syncytial Virus by PCR NEGATIVE NEGATIVE Final    Comment: (NOTE) Fact Sheet for Patients: bloggercourse.com  Fact Sheet for Healthcare Providers: seriousbroker.it  This test is not yet approved or cleared by the [place]  FDA and has been authorized for detection and/or diagnosis of SARS-CoV-2 by FDA under an Emergency Use Authorization (EUA). This EUA will remain in effect (meaning this test can be used) for  the duration of the COVID-19 declaration under Section 564(b)(1) of the Act, 21 U.S.C. section 360bbb-3(b)(1), unless the authorization is terminated or revoked.  Performed at [facility], [address], [place], [place] [address]      Labs: BNP (last 3 results) Recent Labs    06/03/23 2152  BNP 3,234.0*   Basic Metabolic Panel: Recent Labs  Lab 06/03/23 2152 06/04/23 1516 06/05/23 0411 06/06/23 0438 06/07/23 0431  NA 136 133* 137 135 134*  K 4.5 3.9 3.3* 3.7 4.1  CL 104 101 99 98 102  CO2 22 21* 26 27 24   GLUCOSE 122* 123* 106* 112* 109*  BUN 17 20 21  26* 26*  CREATININE 1.26* 1.41* 1.39* 1.35* 1.15  CALCIUM  9.4 9.3 9.2 9.5 9.2  MG  --  2.2  --   --  2.2  PHOS  --   --   --   --  3.7   Liver Function Tests: Recent Labs  Lab 06/03/23 2152  AST 20  ALT 12  ALKPHOS 62  BILITOT 1.2  PROT 7.4  ALBUMIN  4.1   No results for input(s): LIPASE, AMYLASE in the last 168 hours. No results for input(s): AMMONIA in the last 168 hours. CBC: Recent Labs  Lab 06/03/23 2152 06/06/23 0438  WBC 4.4 5.2  NEUTROABS 2.6  --   HGB 13.5 13.2  HCT 40.5 39.0  MCV 94.8 93.8  PLT 124* 142*   Cardiac Enzymes: No results for input(s): CKTOTAL, CKMB, CKMBINDEX, TROPONINI in the last 168 hours. BNP: Invalid input(s): POCBNP CBG:  No results for input(s): GLUCAP in the last 168 hours. D-Dimer No results for input(s): DDIMER in the last 72 hours. Hgb A1c No results for input(s): HGBA1C in the last 72 hours. Lipid Profile No results for input(s): CHOL, HDL, LDLCALC, TRIG, CHOLHDL, LDLDIRECT in the last 72 hours. Thyroid function studies No results for input(s): TSH, T4TOTAL, T3FREE, THYROIDAB in the last 72 hours.  Invalid input(s): FREET3 Anemia work up No results for input(s): VITAMINB12, FOLATE, FERRITIN, TIBC, IRON, RETICCTPCT in the last 72 hours. Urinalysis No results found for: COLORURINE, APPEARANCEUR,  LABSPEC, PHURINE, GLUCOSEU, HGBUR, BILIRUBINUR, KETONESUR, PROTEINUR, UROBILINOGEN, NITRITE, LEUKOCYTESUR Sepsis Labs Recent Labs  Lab 06/03/23 2152 06/06/23 0438  WBC 4.4 5.2   Microbiology Recent Results (from the past 240 hours)  Resp panel by RT-PCR (RSV, Flu A&B, Covid) Anterior Nasal Swab     Status: None   Collection Time: 06/03/23  9:52 PM   Specimen: Anterior Nasal Swab  Result Value Ref Range Status   SARS Coronavirus 2 by RT PCR NEGATIVE NEGATIVE Final    Comment: (NOTE) SARS-CoV-2 target nucleic acids are NOT DETECTED.  The SARS-CoV-2 RNA is generally detectable in upper respiratory specimens during the acute phase of infection. The lowest concentration of SARS-CoV-2 viral copies this assay can detect is 138 copies/mL. A negative result does not preclude SARS-Cov-2 infection and should not be used as the sole basis for treatment or other patient management decisions. A negative result may occur with  improper specimen collection/handling, submission of specimen other than nasopharyngeal swab, presence of viral mutation(s) within the areas targeted by this assay, and inadequate number of viral copies(<138 copies/mL). A negative result must be combined with clinical observations, patient history, and epidemiological information. The expected result is Negative.  Fact Sheet for Patients:  bloggercourse.com  Fact Sheet for Healthcare Providers:  seriousbroker.it  This test is no t yet approved or cleared by the [place]  FDA and  has been authorized for detection and/or diagnosis of SARS-CoV-2 by FDA under an Emergency Use Authorization (EUA). This EUA will remain  in effect (meaning this test can be used) for the duration of the COVID-19 declaration under Section 564(b)(1) of the Act, 21 U.S.C.section 360bbb-3(b)(1), unless the authorization is terminated  or revoked sooner.        Influenza A by PCR NEGATIVE NEGATIVE Final   Influenza B by PCR NEGATIVE NEGATIVE Final    Comment: (NOTE) The Xpert Xpress SARS-CoV-2/FLU/RSV plus assay is intended as an aid in the diagnosis of influenza from Nasopharyngeal swab specimens and should not be used as a sole basis for treatment. Nasal washings and aspirates are unacceptable for Xpert Xpress SARS-CoV-2/FLU/RSV testing.  Fact Sheet for Patients: bloggercourse.com  Fact Sheet for Healthcare Providers: seriousbroker.it  This test is not yet approved or cleared by the [place]  FDA and has been authorized for detection and/or diagnosis of SARS-CoV-2 by FDA under an Emergency Use Authorization (EUA). This EUA will remain in effect (meaning this test can be used) for the duration of the COVID-19 declaration under Section 564(b)(1) of the Act, 21 U.S.C. section 360bbb-3(b)(1), unless the authorization is terminated or revoked.     Resp Syncytial Virus by PCR NEGATIVE NEGATIVE Final    Comment: (NOTE) Fact Sheet for Patients: bloggercourse.com  Fact Sheet for Healthcare Providers: seriousbroker.it  This test is not yet approved or cleared by the [place]  FDA and has been authorized for detection and/or diagnosis of SARS-CoV-2 by FDA under an Emergency Use Authorization (EUA). This  EUA will remain in effect (meaning this test can be used) for the duration of the COVID-19 declaration under Section 564(b)(1) of the Act, 21 U.S.C. section 360bbb-3(b)(1), unless the authorization is terminated or revoked.  Performed at [facility], [address] [place], [place] [address]     Time coordinating discharge:  43 mins   SIGNED:  Afton Louder, MD  [facility] 06/07/2023, 2:47 PM How to contact the [facility] Attending or Consulting provider 7A - 7P or covering provider during after hours 7P -7A, for this  patient?  Check the care team in [facility] and look for a) attending/consulting [facility] provider listed and b) the TR[facility] team listed Log into [email or web address] and use [place]'s universal password to access. If you do not have the password, please contact the hospital operator. Locate the TR[facility] provider you are looking for under [facility] and page to a number that you can be directly reached. If you still have difficulty reaching the provider, please page the [facility] (Director on Call) for the Hospitalists listed on amion for assistance.

## 2023-06-07 NOTE — Progress Notes (Signed)
 Telemetry called, patient had a 7 beat run of Vtach. MD Dolly Rias made aware.

## 2023-06-07 NOTE — Progress Notes (Signed)
 Discharge instructions reviewed, patient verbalized understanding of instructions. Patient discharged home with family in stable condition.

## 2023-06-07 NOTE — Progress Notes (Signed)
 Patient experiencing anxiety. PRN Atarax given- not effective per patient.

## 2023-06-07 NOTE — Plan of Care (Signed)
  Problem: Education: Goal: Knowledge of General Education information will improve Description: Including pain rating scale, medication(s)/side effects and non-pharmacologic comfort measures Outcome: Progressing   Problem: Clinical Measurements: Goal: Respiratory complications will improve Outcome: Progressing   Problem: Coping: Goal: Level of anxiety will decrease Outcome: Not Progressing

## 2023-06-07 NOTE — Care Management Important Message (Signed)
 Important Message  Patient Details  Name: [name] Daniel MRN: [id number] Date of Birth: February 15, 1956   Important Message Given:  Yes - Medicare IM     Corey Harold 06/07/2023, 3:30 PM

## 2023-06-13 ENCOUNTER — Ambulatory Visit (INDEPENDENT_AMBULATORY_CARE_PROVIDER_SITE_OTHER): Payer: Self-pay | Admitting: General Surgery

## 2023-06-13 ENCOUNTER — Encounter: Payer: Self-pay | Admitting: General Surgery

## 2023-06-13 VITALS — BP 122/86 | HR 92 | Temp 97.5°F | Resp 18 | Ht 72.0 in | Wt 161.0 lb

## 2023-06-13 DIAGNOSIS — K460 Unspecified abdominal hernia with obstruction, without gangrene: Secondary | ICD-10-CM

## 2023-06-13 NOTE — Patient Instructions (Addendum)
No heavy lifting > 10 lbs, excessive bending, pushing, pulling, or squatting for 6-8 weeks after surgery.  Diet as tolerated.

## 2023-06-17 NOTE — Progress Notes (Signed)
 [facility]  No issues. Feeling good.   BP 122/86   Pulse 92   Temp (!) 97.5 F (36.4 C) (Oral)   Resp 18   Ht 6' (1.829 m)   Wt 161 lb (73 kg)   SpO2 97%   BMI 21.84 kg/m  Abdomen healing, port sites look good, no signs of recurrent hernia   Patient s/p robotic assisted right inguinal hernia repair. Doing well.  No heavy lifting > 10 lbs, excessive bending, pushing, pulling, or squatting for 6-8 weeks after surgery.  Diet as tolerated.   Manuelita Pander, MD [facility] [address] [place], [place] [address] [phone] (office)

## 2023-07-16 ENCOUNTER — Encounter: Payer: Medicare Other | Admitting: General Surgery

## 2023-07-18 ENCOUNTER — Other Ambulatory Visit: Payer: Self-pay

## 2023-07-18 ENCOUNTER — Ambulatory Visit (INDEPENDENT_AMBULATORY_CARE_PROVIDER_SITE_OTHER): Payer: Self-pay | Admitting: General Surgery

## 2023-07-18 ENCOUNTER — Inpatient Hospital Stay (HOSPITAL_COMMUNITY)
Admission: EM | Admit: 2023-07-18 | Discharge: 2023-08-01 | DRG: 266 | Disposition: A | Discharge status: Home Health | Payer: Medicare Other | Attending: Cardiology | Admitting: Cardiology

## 2023-07-18 ENCOUNTER — Encounter: Payer: Self-pay | Admitting: General Surgery

## 2023-07-18 ENCOUNTER — Encounter (HOSPITAL_COMMUNITY): Payer: Self-pay

## 2023-07-18 ENCOUNTER — Emergency Department (HOSPITAL_COMMUNITY): Payer: Medicare Other

## 2023-07-18 VITALS — BP 129/86 | HR 113 | Temp 97.5°F | Resp 20 | Ht 72.0 in | Wt 178.0 lb

## 2023-07-18 DIAGNOSIS — N390 Urinary tract infection, site not specified: Secondary | ICD-10-CM | POA: Diagnosis present

## 2023-07-18 DIAGNOSIS — Z006 Encounter for examination for normal comparison and control in clinical research program: Secondary | ICD-10-CM

## 2023-07-18 DIAGNOSIS — I472 Ventricular tachycardia, unspecified: Secondary | ICD-10-CM | POA: Diagnosis present

## 2023-07-18 DIAGNOSIS — F1011 Alcohol abuse, in remission: Secondary | ICD-10-CM

## 2023-07-18 DIAGNOSIS — E876 Hypokalemia: Secondary | ICD-10-CM | POA: Diagnosis present

## 2023-07-18 DIAGNOSIS — N5089 Other specified disorders of the male genital organs: Secondary | ICD-10-CM | POA: Insufficient documentation

## 2023-07-18 DIAGNOSIS — R570 Cardiogenic shock: Secondary | ICD-10-CM | POA: Diagnosis present

## 2023-07-18 DIAGNOSIS — I428 Other cardiomyopathies: Secondary | ICD-10-CM | POA: Diagnosis present

## 2023-07-18 DIAGNOSIS — I509 Heart failure, unspecified: Principal | ICD-10-CM

## 2023-07-18 DIAGNOSIS — Z952 Presence of prosthetic heart valve: Secondary | ICD-10-CM | POA: Diagnosis not present

## 2023-07-18 DIAGNOSIS — I5023 Acute on chronic systolic (congestive) heart failure: Secondary | ICD-10-CM | POA: Diagnosis present

## 2023-07-18 DIAGNOSIS — I3139 Other pericardial effusion (noninflammatory): Secondary | ICD-10-CM | POA: Diagnosis present

## 2023-07-18 DIAGNOSIS — I2489 Other forms of acute ischemic heart disease: Secondary | ICD-10-CM | POA: Diagnosis present

## 2023-07-18 DIAGNOSIS — E871 Hypo-osmolality and hyponatremia: Secondary | ICD-10-CM | POA: Diagnosis present

## 2023-07-18 DIAGNOSIS — Z681 Body mass index (BMI) 19 or less, adult: Secondary | ICD-10-CM | POA: Diagnosis not present

## 2023-07-18 DIAGNOSIS — I350 Nonrheumatic aortic (valve) stenosis: Secondary | ICD-10-CM

## 2023-07-18 DIAGNOSIS — Z7984 Long term (current) use of oral hypoglycemic drugs: Secondary | ICD-10-CM

## 2023-07-18 DIAGNOSIS — E538 Deficiency of other specified B group vitamins: Secondary | ICD-10-CM | POA: Diagnosis not present

## 2023-07-18 DIAGNOSIS — Z885 Allergy status to narcotic agent status: Secondary | ICD-10-CM

## 2023-07-18 DIAGNOSIS — D696 Thrombocytopenia, unspecified: Secondary | ICD-10-CM | POA: Diagnosis present

## 2023-07-18 DIAGNOSIS — E1122 Type 2 diabetes mellitus with diabetic chronic kidney disease: Secondary | ICD-10-CM | POA: Diagnosis present

## 2023-07-18 DIAGNOSIS — Z87891 Personal history of nicotine dependence: Secondary | ICD-10-CM

## 2023-07-18 DIAGNOSIS — E1165 Type 2 diabetes mellitus with hyperglycemia: Secondary | ICD-10-CM | POA: Diagnosis present

## 2023-07-18 DIAGNOSIS — I130 Hypertensive heart and chronic kidney disease with heart failure and stage 1 through stage 4 chronic kidney disease, or unspecified chronic kidney disease: Principal | ICD-10-CM | POA: Diagnosis present

## 2023-07-18 DIAGNOSIS — J9811 Atelectasis: Secondary | ICD-10-CM | POA: Diagnosis present

## 2023-07-18 DIAGNOSIS — Z604 Social exclusion and rejection: Secondary | ICD-10-CM | POA: Diagnosis present

## 2023-07-18 DIAGNOSIS — Z7982 Long term (current) use of aspirin: Secondary | ICD-10-CM

## 2023-07-18 DIAGNOSIS — R600 Localized edema: Secondary | ICD-10-CM | POA: Diagnosis present

## 2023-07-18 DIAGNOSIS — J189 Pneumonia, unspecified organism: Secondary | ICD-10-CM | POA: Diagnosis present

## 2023-07-18 DIAGNOSIS — Q2381 Bicuspid aortic valve: Secondary | ICD-10-CM

## 2023-07-18 DIAGNOSIS — E119 Type 2 diabetes mellitus without complications: Secondary | ICD-10-CM | POA: Diagnosis not present

## 2023-07-18 DIAGNOSIS — Z608 Other problems related to social environment: Secondary | ICD-10-CM | POA: Diagnosis present

## 2023-07-18 DIAGNOSIS — N1831 Chronic kidney disease, stage 3a: Secondary | ICD-10-CM | POA: Diagnosis present

## 2023-07-18 DIAGNOSIS — Z820 Family history of epilepsy and other diseases of the nervous system: Secondary | ICD-10-CM

## 2023-07-18 DIAGNOSIS — E43 Unspecified severe protein-calorie malnutrition: Secondary | ICD-10-CM | POA: Diagnosis present

## 2023-07-18 DIAGNOSIS — R64 Cachexia: Secondary | ICD-10-CM | POA: Diagnosis present

## 2023-07-18 DIAGNOSIS — Z79899 Other long term (current) drug therapy: Secondary | ICD-10-CM

## 2023-07-18 DIAGNOSIS — N179 Acute kidney failure, unspecified: Secondary | ICD-10-CM | POA: Diagnosis present

## 2023-07-18 DIAGNOSIS — R4189 Other symptoms and signs involving cognitive functions and awareness: Secondary | ICD-10-CM | POA: Diagnosis present

## 2023-07-18 DIAGNOSIS — R54 Age-related physical debility: Secondary | ICD-10-CM | POA: Diagnosis present

## 2023-07-18 DIAGNOSIS — I7121 Aneurysm of the ascending aorta, without rupture: Secondary | ICD-10-CM | POA: Diagnosis present

## 2023-07-18 DIAGNOSIS — I712 Thoracic aortic aneurysm, without rupture, unspecified: Secondary | ICD-10-CM | POA: Diagnosis present

## 2023-07-18 DIAGNOSIS — Z91199 Patient's noncompliance with other medical treatment and regimen due to unspecified reason: Secondary | ICD-10-CM

## 2023-07-18 DIAGNOSIS — I429 Cardiomyopathy, unspecified: Secondary | ICD-10-CM | POA: Diagnosis not present

## 2023-07-18 DIAGNOSIS — I5082 Biventricular heart failure: Secondary | ICD-10-CM | POA: Diagnosis present

## 2023-07-18 DIAGNOSIS — R911 Solitary pulmonary nodule: Secondary | ICD-10-CM | POA: Diagnosis present

## 2023-07-18 DIAGNOSIS — I2510 Atherosclerotic heart disease of native coronary artery without angina pectoris: Secondary | ICD-10-CM | POA: Diagnosis present

## 2023-07-18 DIAGNOSIS — Z880 Allergy status to penicillin: Secondary | ICD-10-CM

## 2023-07-18 DIAGNOSIS — I5021 Acute systolic (congestive) heart failure: Secondary | ICD-10-CM | POA: Diagnosis not present

## 2023-07-18 DIAGNOSIS — I440 Atrioventricular block, first degree: Secondary | ICD-10-CM | POA: Diagnosis present

## 2023-07-18 HISTORY — DX: Chronic systolic (congestive) heart failure: I50.22

## 2023-07-18 HISTORY — DX: Bicuspid aortic valve: Q23.81

## 2023-07-18 HISTORY — DX: Heart failure, unspecified: I50.9

## 2023-07-18 LAB — COMPREHENSIVE METABOLIC PANEL
ALT: 43 U/L (ref 0–44)
AST: 52 U/L — ABNORMAL HIGH (ref 15–41)
Albumin: 4.1 g/dL (ref 3.5–5.0)
Alkaline Phosphatase: 63 U/L (ref 38–126)
Anion gap: 12 (ref 5–15)
BUN: 35 mg/dL — ABNORMAL HIGH (ref 8–23)
CO2: 22 mmol/L (ref 22–32)
Calcium: 9.3 mg/dL (ref 8.9–10.3)
Chloride: 98 mmol/L (ref 98–111)
Creatinine, Ser: 1.52 mg/dL — ABNORMAL HIGH (ref 0.61–1.24)
GFR, Estimated: 50 mL/min — ABNORMAL LOW (ref >60)
Glucose, Bld: 134 mg/dL — ABNORMAL HIGH (ref 70–99)
Potassium: 4.2 mmol/L (ref 3.5–5.1)
Sodium: 132 mmol/L — ABNORMAL LOW (ref 135–145)
Total Bilirubin: 2.3 mg/dL — ABNORMAL HIGH (ref 0.0–1.2)
Total Protein: 6.8 g/dL (ref 6.5–8.1)

## 2023-07-18 LAB — CBC WITH DIFFERENTIAL/PLATELET
Abs Immature Granulocytes: 0.03 10*3/uL (ref 0.00–0.07)
Basophils Absolute: 0 10*3/uL (ref 0.0–0.1)
Basophils Relative: 0 %
Eosinophils Absolute: 0 10*3/uL (ref 0.0–0.5)
Eosinophils Relative: 0 %
HCT: 41.8 % (ref 39.0–52.0)
Hemoglobin: 14 g/dL (ref 13.0–17.0)
Immature Granulocytes: 1 %
Lymphocytes Relative: 28 %
Lymphs Abs: 1.6 10*3/uL (ref 0.7–4.0)
MCH: 32 pg (ref 26.0–34.0)
MCHC: 33.5 g/dL (ref 30.0–36.0)
MCV: 95.4 fL (ref 80.0–100.0)
Monocytes Absolute: 0.3 10*3/uL (ref 0.1–1.0)
Monocytes Relative: 5 %
Neutro Abs: 3.8 10*3/uL (ref 1.7–7.7)
Neutrophils Relative %: 66 %
Platelets: 120 10*3/uL — ABNORMAL LOW (ref 150–400)
RBC: 4.38 MIL/uL (ref 4.22–5.81)
RDW: 16.8 % — ABNORMAL HIGH (ref 11.5–15.5)
WBC: 5.8 10*3/uL (ref 4.0–10.5)
nRBC: 0 % (ref 0.0–0.2)

## 2023-07-18 LAB — TROPONIN I (HIGH SENSITIVITY)
Troponin I (High Sensitivity): 68 ng/L — ABNORMAL HIGH (ref <18)
Troponin I (High Sensitivity): 68 ng/L — ABNORMAL HIGH (ref <18)

## 2023-07-18 LAB — MRSA NEXT GEN BY PCR, NASAL: MRSA by PCR Next Gen: NOT DETECTED

## 2023-07-18 LAB — BRAIN NATRIURETIC PEPTIDE: B Natriuretic Peptide: >4500 pg/mL — ABNORMAL HIGH (ref 0.0–100.0)

## 2023-07-18 MED ORDER — POTASSIUM CHLORIDE CRYS ER 10 MEQ PO TBCR
10.0000 meq | EXTENDED_RELEASE_TABLET | Freq: Every day | ORAL | Status: DC
  Administered 2023-07-18 – 2023-07-20 (×3): 10 meq via ORAL
  Filled 2023-07-18 (×3): qty 1

## 2023-07-18 MED ORDER — FUROSEMIDE 10 MG/ML IJ SOLN
20.0000 mg | Freq: Four times a day (QID) | INTRAMUSCULAR | Status: DC
  Administered 2023-07-18 – 2023-07-19 (×3): 20 mg via INTRAVENOUS
  Filled 2023-07-18 (×3): qty 2

## 2023-07-18 MED ORDER — CHLORHEXIDINE GLUCONATE CLOTH 2 % EX PADS
6.0000 | MEDICATED_PAD | Freq: Every day | CUTANEOUS | Status: DC
  Administered 2023-07-19 – 2023-07-31 (×14): 6 via TOPICAL

## 2023-07-18 MED ORDER — FUROSEMIDE 10 MG/ML IJ SOLN
20.0000 mg | Freq: Once | INTRAMUSCULAR | Status: AC
  Administered 2023-07-18: 20 mg via INTRAVENOUS
  Filled 2023-07-18: qty 2

## 2023-07-18 MED ORDER — ONDANSETRON HCL 4 MG/2ML IJ SOLN: 4.0000 mg | Freq: Four times a day (QID) | INTRAMUSCULAR | Status: DC | PRN

## 2023-07-18 MED ORDER — SODIUM CHLORIDE 0.9% FLUSH
3.0000 mL | Freq: Two times a day (BID) | INTRAVENOUS | Status: DC
  Administered 2023-07-18 – 2023-08-01 (×24): 3 mL via INTRAVENOUS

## 2023-07-18 MED ORDER — ATENOLOL 25 MG PO TABS
12.5000 mg | ORAL_TABLET | Freq: Every day | ORAL | Status: DC
  Administered 2023-07-18: 12.5 mg via ORAL
  Filled 2023-07-18 (×2): qty 1

## 2023-07-18 MED ORDER — VITAMIN B-12 1000 MCG PO TABS
1000.0000 ug | ORAL_TABLET | Freq: Every day | ORAL | Status: DC
  Administered 2023-07-18 – 2023-08-01 (×15): 1000 ug via ORAL
  Filled 2023-07-18 (×15): qty 1

## 2023-07-18 MED ORDER — FUROSEMIDE 10 MG/ML IJ SOLN: 20.0000 mg | Freq: Once | INTRAMUSCULAR | Status: DC

## 2023-07-18 MED ORDER — SODIUM CHLORIDE 0.9 % IV SOLN: 250.0000 mL | INTRAVENOUS | Status: AC | PRN

## 2023-07-18 MED ORDER — SODIUM CHLORIDE 0.9% FLUSH: 3.0000 mL | INTRAVENOUS | Status: DC | PRN

## 2023-07-18 MED ORDER — HEPARIN SODIUM (PORCINE) 5000 UNIT/ML IJ SOLN
5000.0000 [IU] | Freq: Three times a day (TID) | INTRAMUSCULAR | Status: DC
  Administered 2023-07-19 – 2023-07-22 (×10): 5000 [IU] via SUBCUTANEOUS
  Filled 2023-07-18 (×10): qty 1

## 2023-07-18 MED ORDER — FOLIC ACID 1 MG PO TABS
1.0000 mg | ORAL_TABLET | Freq: Every day | ORAL | Status: DC
  Administered 2023-07-18 – 2023-08-01 (×15): 1 mg via ORAL
  Filled 2023-07-18 (×15): qty 1

## 2023-07-18 MED ORDER — ORAL CARE MOUTH RINSE: 15.0000 mL | OROMUCOSAL | Status: DC | PRN

## 2023-07-18 NOTE — Patient Instructions (Addendum)
Worried about your having an episode of heart failure exacerbation. You need to go to the ED to be elevated with labwork and likely need admission for removing this excessive fluid and monitoring your heart.   Activity as tolerated with regards to your hernia. This area is healed.

## 2023-07-18 NOTE — ED Notes (Signed)
Patient transported to ICU with SWAT RN at this time

## 2023-07-18 NOTE — Progress Notes (Signed)
[facility] Note   HPI:  68 y.o. Male presents to clinic for post-op follow-up evaluation of his right inguinal hernia repair. Patient reports he is short of breath and has leg swelling and scrotal swelling. He has been having diarrhea too.   Review of Systems:  Increased respiratory rate Shallow breathing Peripheral edema Scrotal edema  All other review of systems: otherwise negative   Vital Signs:  BP 129/86   Pulse (!) 113   Temp (!) 97.5 F (36.4 C) (Oral)   Resp 20   Ht 6' (1.829 m)   Wt 178 lb (80.7 kg)   SpO2 90%   BMI 24.14 kg/m    Physical Exam:  Physical Exam Vitals reviewed.  Cardiovascular:     Rate and Rhythm: Tachycardia present.  Pulmonary:     Effort: Tachypnea present.  Abdominal:     General: There is no distension.     Palpations: Abdomen is soft.     Tenderness: There is no abdominal tenderness.     Hernia: No hernia is present.     Comments: Right groin incision healed, no obvious recurrence, mons swollen and scrotal/ penile swelling   Musculoskeletal:     Right lower leg: Edema present.     Left lower leg: Edema present.      Assessment:  68 y.o. yo Male with what looks like a heart failure exacerbation. I spoke with him and his sister. He needs to go to the hospital. ECHO in January 20%. He has not seen cardiology or a PCP. He has not had recommended labs. He says he has not been taking fluid pills. .  Plan:  ED notified of patient coming  Diet and activity as tolerated for the inguinal hernia, 8 weeks out  PRN Follow up with issues from his hernia    Algis Greenhouse, MD [facility] [address] [place], [place] [address] [phone] (office)

## 2023-07-18 NOTE — ED Provider Notes (Signed)
[place] [facility] AT [facility] Provider Note   CSN: [id number] Arrival date & time: 07/18/23  1527     History  Chief Complaint  Patient presents with   Leg Swelling    [name] Daniel is a 68 y.o. male story of HFrEF, severe aortic stenosis, CKD stage III AA, peripheral edema, scrotal edema, thrombocytopenia presented for bilateral leg swelling, shortness of breath, scrotal swelling.  Patient was at preop for a hernia repair when he was told to come to the ED due to shortness of breath and being fluid overloaded.  Patient is on Lasix however states he has missed a few doses and that his last dose was 2 days ago.  Patient denies any chest pain or productive cough or abdominal pain.  Last echo: 06/04/2023 LVEF 20%, pericardial effusion as mild to moderate, mild mitral valve regurg, severe aortic stenosis  Home Medications Prior to Admission medications   Medication Sig Start Date End Date Taking? Authorizing Provider  acetaminophen (TYLENOL) 500 MG tablet Take 500 mg by mouth every 6 (six) hours as needed for mild pain (pain score 1-3).    [provider]  atenolol (TENORMIN) 25 MG tablet Take 0.5 tablets (12.5 mg total) by mouth daily. 06/07/23 06/06/24  Cleora Fleet, MD  cyanocobalamin 1000 MCG tablet Take 1 tablet (1,000 mcg total) by mouth daily. 06/08/23   Johnson, Clanford L, MD  folic acid (FOLVITE) 1 MG tablet Take 1 tablet (1 mg total) by mouth daily. 06/08/23   Johnson, Clanford L, MD  furosemide (LASIX) 20 MG tablet Take 1 tablet (20 mg total) by mouth daily. 06/07/23 06/06/24  Johnson, Clanford L, MD  potassium chloride (KLOR-CON M) 10 MEQ tablet Take 1 tablet (10 mEq total) by mouth daily. 06/08/23   Cleora Fleet, MD      Allergies    Penicillins and Vicodin [hydrocodone-acetaminophen]    Review of Systems   Review of Systems  Physical Exam Updated Vital Signs BP (!) 118/102 (BP Location: Right Arm)   Pulse (!) 110   Temp 97.7 F  (36.5 C) (Oral)   Resp 18   Ht 6' (1.829 m)   Wt 80.7 kg   SpO2 100%   BMI 24.14 kg/m  Physical Exam Constitutional:      General: He is not in acute distress. Cardiovascular:     Rate and Rhythm: Normal rate and regular rhythm.     Pulses: Normal pulses.     Heart sounds: Normal heart sounds.  Pulmonary:     Effort: Pulmonary effort is normal. No respiratory distress.     Breath sounds: Normal breath sounds.     Comments: Able to speak in full sentences Musculoskeletal:     Comments: Bilateral 2+ pitting edema  Skin:    General: Skin is warm and dry.  Neurological:     Mental Status: He is alert.  Psychiatric:        Mood and Affect: Mood normal.     ED Results / Procedures / Treatments   Labs (all labs ordered are listed, but only abnormal results are displayed) Labs Reviewed  CBC WITH DIFFERENTIAL/PLATELET - Abnormal; Notable for the following components:      Result Value   RDW 16.8 (*)    Platelets 120 (*)    All other components within normal limits  BRAIN NATRIURETIC PEPTIDE - Abnormal; Notable for the following components:   B Natriuretic Peptide >4,500.0 (*)    All other components  within normal limits  COMPREHENSIVE METABOLIC PANEL - Abnormal; Notable for the following components:   Sodium 132 (*)    Glucose, Bld 134 (*)    BUN 35 (*)    Creatinine, Ser 1.52 (*)    AST 52 (*)    Total Bilirubin 2.3 (*)    GFR, Estimated 50 (*)    All other components within normal limits  TROPONIN I (HIGH SENSITIVITY) - Abnormal; Notable for the following components:   Troponin I (High Sensitivity) 68 (*)    All other components within normal limits    EKG None  Radiology No results found.  Procedures Ultrasound ED Echo  Date/Time: 07/18/2023 5:14 PM  Performed by: Netta Corrigan, PA-C Authorized by: Netta Corrigan, PA-C   Procedure details:    Indications: dyspnea     Views: subxiphoid, parasternal long axis view, parasternal short axis view, apical  4 chamber view and IVC view     Images: archived     Limitations:  Body habitus Findings:    Pericardium: no pericardial effusion     Cardiac Activity: normal cardiac activity     LV Function: severly depressed (<30%)     RV Diameter: dilated     IVC: dilated   Impression:    Impression: abnormal cardiac activity, probable elevated CVP and decreased contractility       Medications Ordered in ED Medications  furosemide (LASIX) injection 20 mg (has no administration in time range)    ED Course/ Medical Decision Making/ A&P                                 Medical Decision Making Amount and/or Complexity of Data Reviewed Labs: ordered. Radiology: ordered.  Risk Prescription drug management. Decision regarding hospitalization.   [name] Daniel 68 y.o. presented today for shortness of breath.  Working DDx that I considered at this time includes, but not limited to, asthma/COPD exacerbation, URI, viral illness, anemia, ACS, PE, pneumonia, pleural effusion, lung cancer, CHF, respiratory distress, medication side effect, intoxication.  R/o DDx: asthma/COPD exacerbation, URI, viral illness, anemia, ACS, PE, pneumonia, pleural effusion, lung cancer, respiratory distress, medication side effect, intoxication.: These are considered less likely due to history of present illness, physical exam, labs/imaging findings  Review of prior external notes: 06/03/2023 discharge  Unique Tests and My Independent Interpretation:  CBC: Unremarkable CMP: Slight increase in creatinine 1.52, GFR 50 BNP: Greater than 4500 EKG: Sinus tachycardia 110 bpm, first-degree heart block, T wave inversions in V6 otherwise no signs of ST elevations or depressions indicative of ischemia Troponin: 68 CXR: Right lower lobe effusion  Social Determinants of Health: none  Discussion with Independent Historian: None  Discussion of Management of Tests:  Elgergawy, MD Hospitalist  Risk: High: hospitalization or  escalation of hospital-level care  Risk Stratification Score: None  Plan: On exam patient was no acute distress was noted to be tachycardic.Marland Kitchen Physical exam showed bilateral 2+ pitting edema. The cardiac monitor was ordered secondary to the patient's history of shortness of breath and to monitor the patient for dysrhythmia. Cardiac monitor by my independent interpretation showed normal sinus.  Patient is not taking his Lasix over the past few days and does admit to missing doses as well and does appear clinically fluid overloaded.  Patient is endorsing shortness of breath.  Patient does endorse scrotum swelling however this is not new as he does have inguinal hernia that was  post be repaired earlier today but was referred here due to a shortness of breath and being fluid overloaded.  Patient's BNP was greater than 4500 and so we will give him some Lasix and admit to hospitalist.  Bedside echo did not show any pericardial effusions or signs of tamponade but do show decreased contractility with plethoric IVC which would be consistent with his fluid overload and previous echoes.  I spoke to the hospitalist and patient was accepted for admission.  At this time patient stable to be admitted.  This chart was dictated using voice recognition software.  Despite best efforts to proofread,  errors can occur which can change the documentation meaning.         Final Clinical Impression(s) / ED Diagnoses Final diagnoses:  Acute on chronic congestive heart failure, unspecified heart failure type [facility])    Rx / DC Orders ED Discharge Orders     None         Remi Deter 07/18/23 1719    Rondel Baton, MD 07/22/23 [phone]
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
## 2023-07-18 NOTE — ED Provider Triage Note (Signed)
Emergency Medicine Provider Triage Evaluation Note  [name] Daniel , a 68 y.o. male  was evaluated in triage.  Pt complains of bilateral leg swelling, shortness of breath, scrotal swelling.  Patient was at preop for a hernia repair when he was told to come to the ED due to shortness of breath and being fluid overloaded.  Patient is on Lasix however states he has missed a few doses and that his last dose was 2 days ago.  Patient [place] any chest pain or productive cough or abdominal pain..  Review of Systems  Positive:  Negative:   Physical Exam  BP (!) 118/102 (BP Location: Right Arm)   Pulse (!) 110   Temp 97.7 F (36.5 C) (Oral)   Resp 18   Ht 6' (1.829 m)   Wt 80.7 kg   SpO2 100%   BMI 24.14 kg/m  Gen:   Awake, no distress   Resp:  Normal effort  MSK:   Moves extremities without difficulty  Other:  2+ bilateral pitting edema  Medical Decision Making  Medically screening exam initiated at 4:12 PM.  Appropriate orders placed.  [name] Daniel was informed that the remainder of the evaluation will be completed by another provider, this initial triage assessment does not replace that evaluation, and the importance of remaining in the ED until their evaluation is complete.  Workup initiated, suspect patient is fluid overloaded, patient stable at this time.   Netta Corrigan, PA-C 07/18/23 1612

## 2023-07-18 NOTE — ED Triage Notes (Signed)
Pt arrived via POV from PCP office for concerns of bilateral lower extremity swelling, scrotum swelling, and SOB. Pt reports he began noticing the swelling 3 days ago.

## 2023-07-18 NOTE — Progress Notes (Signed)
Patients sister called for update on patient. Update given and questions answered

## 2023-07-19 ENCOUNTER — Other Ambulatory Visit (HOSPITAL_COMMUNITY): Payer: Self-pay

## 2023-07-19 ENCOUNTER — Other Ambulatory Visit: Payer: Self-pay

## 2023-07-19 DIAGNOSIS — I5082 Biventricular heart failure: Secondary | ICD-10-CM

## 2023-07-19 DIAGNOSIS — I350 Nonrheumatic aortic (valve) stenosis: Secondary | ICD-10-CM

## 2023-07-19 DIAGNOSIS — I7121 Aneurysm of the ascending aorta, without rupture: Secondary | ICD-10-CM

## 2023-07-19 LAB — CBC
HCT: 41.8 % (ref 39.0–52.0)
Hemoglobin: 13.6 g/dL (ref 13.0–17.0)
MCH: 30.8 pg (ref 26.0–34.0)
MCHC: 32.5 g/dL (ref 30.0–36.0)
MCV: 94.8 fL (ref 80.0–100.0)
Platelets: 124 10*3/uL — ABNORMAL LOW (ref 150–400)
RBC: 4.41 MIL/uL (ref 4.22–5.81)
RDW: 16.7 % — ABNORMAL HIGH (ref 11.5–15.5)
WBC: 6.6 10*3/uL (ref 4.0–10.5)
nRBC: 0 % (ref 0.0–0.2)

## 2023-07-19 LAB — PROCALCITONIN: Procalcitonin: <0.1 ng/mL

## 2023-07-19 LAB — BASIC METABOLIC PANEL
Anion gap: 13 (ref 5–15)
BUN: 36 mg/dL — ABNORMAL HIGH (ref 8–23)
CO2: 22 mmol/L (ref 22–32)
Calcium: 8.7 mg/dL — ABNORMAL LOW (ref 8.9–10.3)
Chloride: 97 mmol/L — ABNORMAL LOW (ref 98–111)
Creatinine, Ser: 1.56 mg/dL — ABNORMAL HIGH (ref 0.61–1.24)
GFR, Estimated: 48 mL/min — ABNORMAL LOW (ref >60)
Glucose, Bld: 124 mg/dL — ABNORMAL HIGH (ref 70–99)
Potassium: 4 mmol/L (ref 3.5–5.1)
Sodium: 132 mmol/L — ABNORMAL LOW (ref 135–145)

## 2023-07-19 LAB — BLOOD GAS, ARTERIAL
Acid-Base Excess: 0.1 mmol/L (ref 0.0–2.0)
Bicarbonate: 23.6 mmol/L (ref 20.0–28.0)
Drawn by: 27407
O2 Saturation: 96.3 %
Patient temperature: 36.6
pCO2 arterial: 33 mm[Hg] (ref 32–48)
pH, Arterial: 7.46 — ABNORMAL HIGH (ref 7.35–7.45)
pO2, Arterial: 75 mm[Hg] — ABNORMAL LOW (ref 83–108)

## 2023-07-19 LAB — COOXEMETRY PANEL
Carboxyhemoglobin: 0.4 % — ABNORMAL LOW (ref 0.5–1.5)
Methemoglobin: 0.7 % (ref 0.0–1.5)
O2 Saturation: 34.2 %
Total hemoglobin: 14.6 g/dL (ref 12.0–16.0)

## 2023-07-19 LAB — LACTIC ACID, PLASMA: Lactic Acid, Venous: 3 mmol/L (ref 0.5–1.9)

## 2023-07-19 MED ORDER — SODIUM CHLORIDE 0.9% FLUSH
10.0000 mL | Freq: Two times a day (BID) | INTRAVENOUS | Status: DC
  Administered 2023-07-19 – 2023-07-23 (×5): 10 mL
  Administered 2023-07-24: 20 mL
  Administered 2023-07-24 – 2023-07-25 (×2): 10 mL
  Administered 2023-07-25 – 2023-07-26 (×2): 40 mL
  Administered 2023-07-26: 10 mL
  Administered 2023-07-27: 40 mL
  Administered 2023-07-27: 10 mL
  Administered 2023-07-28: 40 mL
  Administered 2023-07-28 – 2023-07-31 (×7): 10 mL

## 2023-07-19 MED ORDER — FUROSEMIDE 10 MG/ML IJ SOLN
6.0000 mg/h | INTRAVENOUS | Status: DC
  Administered 2023-07-19 – 2023-07-20 (×2): 10 mg/h via INTRAVENOUS
  Administered 2023-07-20: 12 mg/h via INTRAVENOUS
  Administered 2023-07-22: 6 mg/h via INTRAVENOUS
  Filled 2023-07-19 (×6): qty 20

## 2023-07-19 MED ORDER — FUROSEMIDE 10 MG/ML IJ SOLN: 40.0000 mg | Freq: Two times a day (BID) | INTRAMUSCULAR | Status: DC

## 2023-07-19 MED ORDER — ZOLPIDEM TARTRATE 5 MG PO TABS
5.0000 mg | ORAL_TABLET | Freq: Every evening | ORAL | Status: DC | PRN
  Administered 2023-07-20 – 2023-07-27 (×7): 5 mg via ORAL
  Filled 2023-07-19 (×7): qty 1

## 2023-07-19 MED ORDER — ACETAMINOPHEN 500 MG PO TABS
500.0000 mg | ORAL_TABLET | Freq: Four times a day (QID) | ORAL | Status: DC | PRN
  Administered 2023-07-19 – 2023-07-30 (×7): 500 mg via ORAL
  Filled 2023-07-19 (×7): qty 1

## 2023-07-19 MED ORDER — TRAMADOL HCL 50 MG PO TABS
50.0000 mg | ORAL_TABLET | Freq: Two times a day (BID) | ORAL | Status: DC | PRN
  Administered 2023-07-19 – 2023-07-30 (×13): 50 mg via ORAL
  Filled 2023-07-19 (×14): qty 1

## 2023-07-19 MED ORDER — SODIUM CHLORIDE 0.9% FLUSH: 10.0000 mL | INTRAVENOUS | Status: DC | PRN

## 2023-07-19 NOTE — Progress Notes (Signed)
Transferred to . Stable and feeling good and resting comfortably. AHF to see in the morning.

## 2023-07-19 NOTE — TOC Initial Note (Signed)
Transition of Care [facility]) - Initial/Assessment Note    Patient Details  Name: [name] Daniel MRN: [id number] Date of Birth: [birth year]  [facility]) CM/SW Contact:    Karn Cassis, LCSW Phone Number: 07/19/2023, 10:02 AM  Clinical Narrative: Pt admitted due to acute on chronic systolic CHF. TOC received consult for CHF screening. Pt reports he lives alone and is independent with ADLs. His sister takes him to appointments. Pt does not have PCP. PCP list added to AVS. He plans to return home when medically stable.  CHF screening completed. Pt indicates he does not weigh himself regularly and does not follow heart healthy diet. He reports he has not been on any prescribed medications until this point. Discussed importance of daily weights and following heart healthy diet. Will add CHF education to AVS as well. TOC will follow.                Expected Discharge Plan: Home/Self Care Barriers to Discharge: Continued Medical Work up   Patient Goals and CMS Choice Patient states their goals for this hospitalization and ongoing recovery are:: return home   Choice offered to / list presented to : Patient [place] ownership interest in [employer]provided to::  (n/a)    Expected Discharge Plan and Services In-house Referral: Clinical Social Work     Living arrangements for the past 2 months: Single Family Home                                      Prior Living Arrangements/Services Living arrangements for the past 2 months: Single Family Home Lives with:: Self Patient language and need for interpreter reviewed:: Yes Do you feel safe going back to the place where you live?: Yes      Need for Family Participation in Patient Care: No (Comment)     Criminal Activity/Legal Involvement Pertinent to Current Situation/Hospitalization: No - Comment as needed  Activities of Daily Living   ADL Screening (condition at time of admission) Independently  performs ADLs?: Yes (appropriate for developmental age) Is the patient deaf or have difficulty hearing?: No Does the patient have difficulty seeing, even when wearing glasses/contacts?: No Does the patient have difficulty concentrating, remembering, or making decisions?: No  Permission Sought/Granted                  Emotional Assessment     Affect (typically observed): Appropriate Orientation: : Oriented to Place, Oriented to  Time, Oriented to Situation, Oriented to Self Alcohol / Substance Use: Not Applicable Psych Involvement: No (comment)  Admission diagnosis:  Acute on chronic systolic CHF (congestive heart failure) (HCC) [I50.23] Acute on chronic congestive heart failure, unspecified heart failure type (HCC) [I50.9] Patient Active Problem List   Diagnosis Date Noted   Scrotal edema 07/18/2023   Peripheral edema 07/18/2023   Acute on chronic systolic CHF (congestive heart failure) (HCC) 07/18/2023   B12 deficiency 06/05/2023   Folate deficiency 06/05/2023   Acute HFrEF (heart failure with reduced ejection fraction) (HCC) 06/05/2023   Severe aortic stenosis 06/05/2023   Cardiomyopathy (HCC) 06/05/2023   Stage 3a chronic kidney disease (CKD) (HCC) 06/05/2023   Elevated d-dimer 06/05/2023   Thrombocytopenia (HCC) 06/04/2023   Thoracic aortic aneurysm (HCC) 06/04/2023   Incarcerated hernia 05/09/2023   Intestinal obstruction (HCC) 05/09/2023   Upper respiratory infection 05/08/2023   PCP:  Patient, No Pcp Per Pharmacy:  [id number] - [place], [place] - [address] [place] [address] Phone: [phone] Fax: [phone]  [facility] - [place], [place] - [address]  [place] [address] Phone: [phone] Fax: [phone]  [facility] [place], [place] - [address] [place] [address] [place] [address] Phone: [phone] Fax:  [phone]     Social Drivers of Health (SDOH) Social History: SDOH Screenings   Food Insecurity: No Food Insecurity (07/18/2023)  Housing: Low Risk  (07/18/2023)  Transportation Needs: No Transportation Needs (07/18/2023)  Utilities: Not At Risk (07/18/2023)  Social Connections: Socially Isolated (07/18/2023)  Tobacco Use: Medium Risk (07/18/2023)   SDOH Interventions:     Readmission Risk Interventions    05/09/2023   12:49 PM  Readmission Risk Prevention Plan  Post Dischage Appt Not Complete  Medication Screening Complete  Transportation Screening Complete

## 2023-07-19 NOTE — Progress Notes (Signed)
Peripherally Inserted Central Catheter Placement  The IV Nurse has discussed with the patient and/or persons authorized to consent for the patient, the purpose of this procedure and the potential benefits and risks involved with this procedure.  The benefits include less needle sticks, lab draws from the catheter, and the patient may be discharged home with the catheter. Risks include, but not limited to, infection, bleeding, blood clot (thrombus formation), and puncture of an artery; nerve damage and irregular heartbeat and possibility to perform a PICC exchange if needed/ordered by physician.  Alternatives to this procedure were also discussed.  Bard Power PICC patient education guide, fact sheet on infection prevention and patient information card has been provided to patient /or left at bedside.    PICC Placement Documentation  PICC Double Lumen 07/19/23 Right Basilic 41 cm 0 cm (Active)  Indication for Insertion or Continuance of Line Vasoactive infusions 07/19/23 1934  Exposed Catheter (cm) 0 cm 07/19/23 1934  Site Assessment Clean, Dry, Intact 07/19/23 1934  Lumen #1 Status Flushed;Saline locked;Blood return noted 07/19/23 1934  Lumen #2 Status Flushed;Saline locked;Blood return noted 07/19/23 1934  Dressing Type Transparent;Securing device 07/19/23 1934  Dressing Status Antimicrobial disc/dressing in place;Clean, Dry, Intact 07/19/23 1934  Line Care Connections checked and tightened 07/19/23 1934  Line Adjustment (NICU/IV Team Only) No 07/19/23 1934  Dressing Intervention New dressing;Adhesive placed at insertion site (IV team only);Other (Comment) 07/19/23 1934  Dressing Change Due 07/26/23 07/19/23 1934       Annett Fabian 07/19/2023, 7:36 PM

## 2023-07-19 NOTE — Progress Notes (Signed)
Patient placed on BIPAP per MD order.  Patient tolerating well at this time.

## 2023-07-19 NOTE — Progress Notes (Addendum)
TRH ROUNDING   NOTE [name] Daniel EPP:[id number]  DOB: July 24, 1955  DOA: 07/18/2023  PCP: Patient, No Pcp Per  07/19/2023,7:57 AM   LOS: 1 day      Code Status: Full code From: Home  current Dispo: Likely home   3Y male 12/4-12/8 recent incarcerated hernia status postrepair--- readmitted 12/30-1/3 SOB at rest-found to have systolic heart failure on echo EF 20%-was scheduled to follow-up at [facility] patient refused transfer was discharged on Lasix atenolol with instructions for follow-up Also ascending aortic aneurysm B12 deficiency and thrombocytopenia  2/13 followed for right inguinal hernia at surgery office found to be short of breath, lower extremity scrotal edema for about a week sent to the hospital Low normal blood pressures on arrival Sodium 132 BUN/creatinine 35/1 point LFTs normal, WBC 5.8 hemoglobin 14 platelet 120---- portable chest x-ray airspace opacity RLL and RML?  Atelectasis?  Pneumonia HS-troponin 68, BNP >4500  procedures     Plan  Acute HFrEF with BNP>4,500--troponin elevation likely due to HF, EKG reviewed and shows no reciprocal changes and flipped V6, but no damage pattern Possible Cardio-renal syndrome vs worsening creat 2/2 diarr Cardiology rec's transfer to [facility] for HF management in a setting of Severe AoS Will trial bipap, seems comfortable currently satting >99 % RR at time of my exam ~ 15 talking in full sentences Will need careful optimization of meds as is pre-load dependant Lasix gtt 10 mg/h started per cardiology--cannot tolerate GDMT--wasn't taking Ateolol 25 bd at home He seems warm and wet--will probably need CO-Ox and PICC line placed once re-eval by cardiology at [facility]--Cardiology here Dr. Dennison Mascot aware and will confer with her collegeues at [facility] Pneumonia versus atelectasis White count not elevated, Chest to my exam is relatively clear Will obtain PCT and CXR 2 view in am--would hold Abx at this time as scenario more consistent with HF ?   Cardiorenal syndrome Severe AoS Hold GDMT and BB for now.  Cardiology to further eval and decide on course of action Diuresis will help with Renal function if this is Cardio-renal syndrome--will need Diuresis very cautiously and may require advanced therapies or placement of PICC line additionally Check bladder scan for completion given possibility LUTS Recheck labs daily Thrombocytopenia B12 folic acid deficiency Ascending aortic aneurysm Needs OP follow up and management once all stable   DVT prophylaxis: Heparin  Status is: Inpatient Remains inpatient appropriate because: Requires further management and care at [facility]    Subjective: Awake alert no distress to my exam he is sitting up at side of bed has taken off his oxygen is talking in full sentences He has no chest pain His main concern is that he feels swollen in his lower extremities He does not seem terribly short of breath although is a little tachypneic  Objective + exam Vitals:   07/19/23 0604 07/19/23 0605 07/19/23 0700 07/19/23 0755  BP: (!) 120/98  107/89   Pulse: 91 89 86   Resp: (!) 27 20 (!) 7   Temp:    97.7 F (36.5 C)  TempSrc:    Oral  SpO2: 98% 98% (!) 87%   Weight:      Height:       Filed Weights   07/18/23 1540 07/18/23 2200 07/19/23 0500  Weight: 80.7 kg 78.3 kg 76.2 kg    Examination: EOMI NCAT no icterus no pallor no wheeze rales rhonchi to my exam S1-S2 no murmur seems to be in sinus rhythm, JVD ^^^ ROM is intact  power 5/5 Abdomen soft no rebound no guarding Neuro is intact Psych is euthymic  Data Reviewed: reviewed   CBC    Component Value Date/Time   WBC 6.6 07/19/2023 0525   RBC 4.41 07/19/2023 0525   HGB 13.6 07/19/2023 0525   HCT 41.8 07/19/2023 0525   PLT 124 (L) 07/19/2023 0525   MCV 94.8 07/19/2023 0525   MCH 30.8 07/19/2023 0525   MCHC 32.5 07/19/2023 0525   RDW 16.7 (H) 07/19/2023 0525   LYMPHSABS 1.6 07/18/2023 1609   MONOABS 0.3 07/18/2023 1609   EOSABS 0.0  07/18/2023 1609   BASOSABS 0.0 07/18/2023 1609      Latest Ref Rng & Units 07/19/2023    5:25 AM 07/18/2023    4:09 PM 06/07/2023    4:31 AM  CMP  Glucose 70 - 99 mg/dL 914  782  956   BUN 8 - 23 mg/dL 36  35  26   Creatinine 0.61 - 1.24 mg/dL 2.13  0.86  5.78   Sodium 135 - 145 mmol/L 132  132  134   Potassium 3.5 - 5.1 mmol/L 4.0  4.2  4.1   Chloride 98 - 111 mmol/L 97  98  102   CO2 22 - 32 mmol/L 22  22  24    Calcium 8.9 - 10.3 mg/dL 8.7  9.3  9.2   Total Protein 6.5 - 8.1 g/dL  6.8    Total Bilirubin 0.0 - 1.2 mg/dL  2.3    Alkaline Phos 38 - 126 U/L  63    AST 15 - 41 U/L  52    ALT 0 - 44 U/L  43      Scheduled Meds:  atenolol  12.5 mg Oral Daily   Chlorhexidine Gluconate Cloth  6 each Topical Daily   cyanocobalamin  1,000 mcg Oral Daily   folic acid  1 mg Oral Daily   furosemide  20 mg Intravenous Q6H   furosemide  20 mg Intravenous Once   heparin  5,000 Units Subcutaneous Q8H   potassium chloride  10 mEq Oral Daily   sodium chloride flush  3 mL Intravenous Q12H   Continuous Infusions:  sodium chloride      Time  47  Rhetta Mura, MD  [facility]

## 2023-07-19 NOTE — Care Plan (Addendum)
Patient re-evaluated at the bedside   BP (!) 115/97   Pulse 96   Temp (!) 97.4 F (36.3 C) (Axillary)   Resp 10   Ht 6\' 1"  (1.854 m)   Wt 76.2 kg   SpO2 94%   BMI 22.16 kg/m  Patient breathing comfortably on nasal oxygen, has been off BIpap since am has put out over 2 liters urine No cp fever. HE is having cramping to LE's probably from diuresis so we gave tramadol. His ABG showed he is alkalotic but hypoxic, so might get away with NRB vs canula   Transport has been called to [facility], I have offered to update his sister, he declines citing "she is working" she won't answer me, and will probably not answer you"  Patient stable for transport  Cardiology at [facility] aware of patient---they are aware to see on arrival at [facility]  Dr. Caleb Popp has been briefed on patient as well in case of needs on arrival at [facility]  >60 min additional time CRITICAL CARE Performed by: Rhetta Mura   Total critical care time: 33 minutes  Critical care time was exclusive of separately billable procedures and treating other patients.  Critical care was necessary to treat or prevent imminent or life-threatening deterioration.  Critical care was time spent personally by me on the following activities: development of treatment plan with patient and/or surrogate as well as nursing, discussions with consultants, evaluation of patient's response to treatment, examination of patient, obtaining history from patient or surrogate, ordering and performing treatments and interventions, ordering and review of laboratory studies, ordering and review of radiographic studies, pulse oximetry and re-evaluation of patient's condition.
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
## 2023-07-19 NOTE — Discharge Instructions (Signed)

## 2023-07-20 ENCOUNTER — Inpatient Hospital Stay (HOSPITAL_COMMUNITY): Payer: Medicare Other

## 2023-07-20 DIAGNOSIS — I5023 Acute on chronic systolic (congestive) heart failure: Secondary | ICD-10-CM

## 2023-07-20 LAB — FOLATE: Folate: >40 ng/mL (ref >5.9)

## 2023-07-20 LAB — BASIC METABOLIC PANEL
Anion gap: 12 (ref 5–15)
Anion gap: 17 — ABNORMAL HIGH (ref 5–15)
Anion gap: 13 (ref 5–15)
BUN: 33 mg/dL — ABNORMAL HIGH (ref 8–23)
BUN: 33 mg/dL — ABNORMAL HIGH (ref 8–23)
BUN: 33 mg/dL — ABNORMAL HIGH (ref 8–23)
CO2: 27 mmol/L (ref 22–32)
CO2: 27 mmol/L (ref 22–32)
CO2: 30 mmol/L (ref 22–32)
Calcium: 8.7 mg/dL — ABNORMAL LOW (ref 8.9–10.3)
Calcium: 8.8 mg/dL — ABNORMAL LOW (ref 8.9–10.3)
Calcium: 8.6 mg/dL — ABNORMAL LOW (ref 8.9–10.3)
Chloride: 95 mmol/L — ABNORMAL LOW (ref 98–111)
Chloride: 90 mmol/L — ABNORMAL LOW (ref 98–111)
Chloride: 89 mmol/L — ABNORMAL LOW (ref 98–111)
Creatinine, Ser: 1.81 mg/dL — ABNORMAL HIGH (ref 0.61–1.24)
Creatinine, Ser: 1.6 mg/dL — ABNORMAL HIGH (ref 0.61–1.24)
Creatinine, Ser: 1.66 mg/dL — ABNORMAL HIGH (ref 0.61–1.24)
GFR, Estimated: 40 mL/min — ABNORMAL LOW (ref >60)
GFR, Estimated: 47 mL/min — ABNORMAL LOW (ref >60)
GFR, Estimated: 45 mL/min — ABNORMAL LOW (ref >60)
Glucose, Bld: 135 mg/dL — ABNORMAL HIGH (ref 70–99)
Glucose, Bld: 122 mg/dL — ABNORMAL HIGH (ref 70–99)
Glucose, Bld: 157 mg/dL — ABNORMAL HIGH (ref 70–99)
Potassium: 3 mmol/L — ABNORMAL LOW (ref 3.5–5.1)
Potassium: 2.7 mmol/L — CL (ref 3.5–5.1)
Potassium: 3.9 mmol/L (ref 3.5–5.1)
Sodium: 134 mmol/L — ABNORMAL LOW (ref 135–145)
Sodium: 134 mmol/L — ABNORMAL LOW (ref 135–145)
Sodium: 132 mmol/L — ABNORMAL LOW (ref 135–145)

## 2023-07-20 LAB — IRON AND TIBC
Iron: 50 ug/dL (ref 45–182)
Saturation Ratios: 14 % — ABNORMAL LOW (ref 17.9–39.5)
TIBC: 354 ug/dL (ref 250–450)
UIBC: 304 ug/dL

## 2023-07-20 LAB — RETICULOCYTES
Immature Retic Fract: 19.9 % — ABNORMAL HIGH (ref 2.3–15.9)
RBC.: 4.35 MIL/uL (ref 4.22–5.81)
Retic Count, Absolute: 102 10*3/uL (ref 19.0–186.0)
Retic Ct Pct: 2.4 % (ref 0.4–3.1)

## 2023-07-20 LAB — CBC WITH DIFFERENTIAL/PLATELET
Abs Immature Granulocytes: 0.02 10*3/uL (ref 0.00–0.07)
Basophils Absolute: 0 10*3/uL (ref 0.0–0.1)
Basophils Relative: 0 %
Eosinophils Absolute: 0 10*3/uL (ref 0.0–0.5)
Eosinophils Relative: 1 %
HCT: 39.9 % (ref 39.0–52.0)
Hemoglobin: 13.7 g/dL (ref 13.0–17.0)
Immature Granulocytes: 0 %
Lymphocytes Relative: 23 %
Lymphs Abs: 1.6 10*3/uL (ref 0.7–4.0)
MCH: 31.6 pg (ref 26.0–34.0)
MCHC: 34.3 g/dL (ref 30.0–36.0)
MCV: 91.9 fL (ref 80.0–100.0)
Monocytes Absolute: 0.4 10*3/uL (ref 0.1–1.0)
Monocytes Relative: 6 %
Neutro Abs: 4.9 10*3/uL (ref 1.7–7.7)
Neutrophils Relative %: 70 %
Platelets: 97 10*3/uL — ABNORMAL LOW (ref 150–400)
RBC: 4.34 MIL/uL (ref 4.22–5.81)
RDW: 16.8 % — ABNORMAL HIGH (ref 11.5–15.5)
WBC: 7.1 10*3/uL (ref 4.0–10.5)
nRBC: 0 % (ref 0.0–0.2)

## 2023-07-20 LAB — COOXEMETRY PANEL
Carboxyhemoglobin: 0.8 % (ref 0.5–1.5)
Carboxyhemoglobin: 1.3 % (ref 0.5–1.5)
Carboxyhemoglobin: 1.6 % — ABNORMAL HIGH (ref 0.5–1.5)
Methemoglobin: <0.7 % (ref 0.0–1.5)
Methemoglobin: <0.7 % (ref 0.0–1.5)
Methemoglobin: <0.7 % (ref 0.0–1.5)
O2 Saturation: 38.4 %
O2 Saturation: 45.5 %
O2 Saturation: 55.8 %
Total hemoglobin: 13.9 g/dL (ref 12.0–16.0)
Total hemoglobin: 14 g/dL (ref 12.0–16.0)
Total hemoglobin: 14.1 g/dL (ref 12.0–16.0)

## 2023-07-20 LAB — FERRITIN: Ferritin: 138 ng/mL (ref 24–336)

## 2023-07-20 LAB — HEMOGLOBIN A1C
Hgb A1c MFr Bld: 6.6 % — ABNORMAL HIGH (ref 4.8–5.6)
Mean Plasma Glucose: 142.72 mg/dL

## 2023-07-20 LAB — HIV ANTIBODY (ROUTINE TESTING W REFLEX): HIV Screen 4th Generation wRfx: NONREACTIVE

## 2023-07-20 LAB — VITAMIN B12: Vitamin B-12: 1020 pg/mL — ABNORMAL HIGH (ref 180–914)

## 2023-07-20 LAB — TSH: TSH: 2.193 u[IU]/mL (ref 0.350–4.500)

## 2023-07-20 MED ORDER — POTASSIUM CHLORIDE CRYS ER 20 MEQ PO TBCR
60.0000 meq | EXTENDED_RELEASE_TABLET | ORAL | Status: AC
  Administered 2023-07-20 (×2): 60 meq via ORAL
  Filled 2023-07-20 (×2): qty 3

## 2023-07-20 MED ORDER — ROSUVASTATIN CALCIUM 20 MG PO TABS
20.0000 mg | ORAL_TABLET | Freq: Every day | ORAL | Status: DC
  Administered 2023-07-20 – 2023-08-01 (×13): 20 mg via ORAL
  Filled 2023-07-20 (×13): qty 1

## 2023-07-20 MED ORDER — DIGOXIN 125 MCG PO TABS
0.1250 mg | ORAL_TABLET | Freq: Every day | ORAL | Status: DC
  Administered 2023-07-20 – 2023-08-01 (×13): 0.125 mg via ORAL
  Filled 2023-07-20 (×13): qty 1

## 2023-07-20 MED ORDER — MILRINONE LACTATE IN DEXTROSE 20-5 MG/100ML-% IV SOLN
0.3750 ug/kg/min | INTRAVENOUS | Status: DC
  Administered 2023-07-20: 0.25 ug/kg/min via INTRAVENOUS
  Administered 2023-07-20 – 2023-07-22 (×4): 0.375 ug/kg/min via INTRAVENOUS
  Filled 2023-07-20 (×5): qty 100

## 2023-07-20 MED ORDER — POTASSIUM CHLORIDE CRYS ER 20 MEQ PO TBCR
40.0000 meq | EXTENDED_RELEASE_TABLET | ORAL | Status: DC
  Administered 2023-07-20: 40 meq via ORAL
  Filled 2023-07-20: qty 2

## 2023-07-20 MED ORDER — ASPIRIN 81 MG PO CHEW
81.0000 mg | CHEWABLE_TABLET | Freq: Every day | ORAL | Status: DC
  Administered 2023-07-20 – 2023-07-21 (×2): 81 mg via ORAL
  Filled 2023-07-20 (×3): qty 1

## 2023-07-20 MED ORDER — POTASSIUM CHLORIDE CRYS ER 20 MEQ PO TBCR
40.0000 meq | EXTENDED_RELEASE_TABLET | ORAL | Status: AC
  Administered 2023-07-20: 40 meq via ORAL
  Filled 2023-07-20: qty 2

## 2023-07-20 NOTE — Plan of Care (Signed)

## 2023-07-20 NOTE — Plan of Care (Signed)
  Problem: Education: Goal: Knowledge of General Education information will improve Description: Including pain rating scale, medication(s)/side effects and non-pharmacologic comfort measures Outcome: Progressing   Problem: Health Behavior/Discharge Planning: Goal: Ability to manage health-related needs will improve Outcome: Progressing   Problem: Activity: Goal: Risk for activity intolerance will decrease Outcome: Progressing   Problem: Nutrition: Goal: Adequate nutrition will be maintained Outcome: Progressing   Problem: Coping: Goal: Level of anxiety will decrease Outcome: Progressing   Problem: Elimination: Goal: Will not experience complications related to bowel motility Outcome: Progressing Goal: Will not experience complications related to urinary retention Outcome: Progressing   Problem: Pain Managment: Goal: General experience of comfort will improve and/or be controlled Outcome: Progressing   Problem: Safety: Goal: Ability to remain free from injury will improve Outcome: Progressing   Problem: Skin Integrity: Goal: Risk for impaired skin integrity will decrease Outcome: Progressing   Problem: Education: Goal: Ability to demonstrate management of disease process will improve Outcome: Progressing Goal: Ability to verbalize understanding of medication therapies will improve Outcome: Progressing Goal: Individualized Educational Video(s) Outcome: Progressing   Problem: Activity: Goal: Capacity to carry out activities will improve Outcome: Progressing   Problem: Cardiac: Goal: Ability to achieve and maintain adequate cardiopulmonary perfusion will improve Outcome: Progressing
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
## 2023-07-20 NOTE — Progress Notes (Addendum)
PROGRESS NOTE    [name] Daniel  BLT:[id number] DOB: 1956-03-17 DOA: 07/18/2023 PCP: Patient, No Pcp Per  67/M with recently diagnosed systolic CHF, at [facility] discharged on 1/3, refused cardiac cath then, prior to that hospitalized in December for incarcerated hernia, underwent mesh hernia repair. -Back in the ED 2/13 with shortness of breath lower extremity edema and volume overload, noted to have 3+ edema, scrotal edema, BNP> 4500, creatinine 1.5, troponin 68, chest x-ray with pleural effusion and volume overload, TX to [facility] for Cards eval today  Subjective: -Poor historian, reports breathing a little better  Assessment and Plan:  Acute on chronic systolic CHF Cardiomyopathy -Recent echo 12/24 with EF 20%, mildly reduced RV, severe low-flow low gradient aortic stenosis, mild to moderate pericardial effusion -Declined LHC then, now agreeable for workup -Cards following, diuresing well, continue Lasix gtt., Toprol discontinued, starting milrinone today -EtOH use could be contributing to his cardiomyopathy -Hold off on GDMT with soft blood pressures and severe aortic stenosis -Plan for right and left heart cath when optimized  Severe AS -Await cards input  Severe hypokalemia Replace, check mag  Hyperglycemia Check HbA1c   Thrombocytopenia -Platelet count at baseline   Folic acid deficiency -Continue with supplement, also check B12 and anemia panel  Cognitive deficits -Check TSH, RPR  History of EtOH use -No withdrawal at this time, monitor   Ascending aortic aneurysm -Incidental finding on CTA chest recent admission -outpatient surveillance     DVT prophylaxis: Heparin subcutaneous Code Status: Full code Family Communication: No family at bedside Disposition Plan: Home pending above workup  Consultants: Cards   Procedures:   Antimicrobials:    Objective: Vitals:   07/19/23 2354 07/20/23 0501 07/20/23 0502 07/20/23 0733  BP: (!) 107/93  95/83 102/82   Pulse: (!) 102   98  Resp: 18  18 16   Temp: 97.8 F (36.6 C)  97.8 F (36.6 C) 97.6 F (36.4 C)  TempSrc: Oral  Oral Oral  SpO2: 97%  93%   Weight:  73.8 kg    Height:        Intake/Output Summary (Last 24 hours) at 07/20/2023 1018 Last data filed at 07/20/2023 1014 Gross per 24 hour  Intake 660.78 ml  Output 6600 ml  Net -5939.22 ml   Filed Weights   07/19/23 0500 07/19/23 1835 07/20/23 0501  Weight: 76.2 kg 76.8 kg 73.8 kg    Examination:  General exam: Moderately ill male laying in bed, AAO x 2, mild cognitive deficits HEENT: Positive JVD CVS: S1-S2, regular rhythm, systolic murmur Lungs: Few Rales, decreased breath sounds at the bases Abdomen: Soft, nontender, bowel sounds present, peripheral edema Extremities: 1-2+ edema  Skin: No rashes Psychiatry: Poor insight    Data Reviewed:   CBC: Recent Labs  Lab 07/18/23 1609 07/19/23 0525 07/20/23 0500  WBC 5.8 6.6 7.1  NEUTROABS 3.8  --  4.9  HGB 14.0 13.6 13.7  HCT 41.8 41.8 39.9  MCV 95.4 94.8 91.9  PLT 120* 124* 97*   Basic Metabolic Panel: Recent Labs  Lab 07/18/23 1609 07/19/23 0525 07/20/23 0827  NA 132* 132* 134*  K 4.2 4.0 2.7*  CL 98 97* 90*  CO2 22 22 27   GLUCOSE 134* 124* 122*  BUN 35* 36* 33*  CREATININE 1.52* 1.56* 1.60*  CALCIUM 9.3 8.7* 8.8*   GFR: Estimated Creatinine Clearance: 46.8 mL/min (A) (by C-G formula based on SCr of 1.6 mg/dL (H)). Liver Function Tests: Recent Labs  Lab 07/18/23 1609  AST 52*  ALT 43  ALKPHOS 63  BILITOT 2.3*  PROT 6.8  ALBUMIN 4.1   No results for input(s): "LIPASE", "AMYLASE" in the last 168 hours. No results for input(s): "AMMONIA" in the last 168 hours. Coagulation Profile: No results for input(s): "INR", "PROTIME" in the last 168 hours. Cardiac Enzymes: No results for input(s): "CKTOTAL", "CKMB", "CKMBINDEX", "TROPONINI" in the last 168 hours. BNP (last 3 results) No results for input(s): "PROBNP" in the last 8760 hours. HbA1C: No  results for input(s): "HGBA1C" in the last 72 hours. CBG: No results for input(s): "GLUCAP" in the last 168 hours. Lipid Profile: No results for input(s): "CHOL", "HDL", "LDLCALC", "TRIG", "CHOLHDL", "LDLDIRECT" in the last 72 hours. Thyroid Function Tests: No results for input(s): "TSH", "T4TOTAL", "FREET4", "T3FREE", "THYROIDAB" in the last 72 hours. Anemia Panel: No results for input(s): "VITAMINB12", "FOLATE", "FERRITIN", "TIBC", "IRON", "RETICCTPCT" in the last 72 hours. Urine analysis: No results found for: "COLORURINE", "APPEARANCEUR", "LABSPEC", "PHURINE", "GLUCOSEU", "HGBUR", "BILIRUBINUR", "KETONESUR", "PROTEINUR", "UROBILINOGEN", "NITRITE", "LEUKOCYTESUR" Sepsis Labs: @LABRCNTIP (procalcitonin:4,lacticidven:4)  ) Recent Results (from the past 240 hours)  MRSA Next Gen by PCR, Nasal     Status: None   Collection Time: 07/18/23  9:15 PM   Specimen: Nasal Mucosa; Nasal Swab  Result Value Ref Range Status   MRSA by PCR Next Gen NOT DETECTED NOT DETECTED Final    Comment: (NOTE) The GeneXpert MRSA Assay (FDA approved for NASAL specimens only), is one component of a comprehensive MRSA colonization surveillance program. It is not intended to diagnose MRSA infection nor to guide or monitor treatment for MRSA infections. Test performance is not FDA approved in patients less than 50 years old. Performed at [facility], [address], [place], [place] [address]      Radiology Studies: DG Chest 2 View Result Date: 07/20/2023 CLINICAL DATA:  Leg swelling EXAM: CHEST - 2 VIEW COMPARISON:  07/18/2023 FINDINGS: There is a right arm PICC line with tip in the superior cavoatrial due junction. Stable cardiomediastinal contours. No significant pleural effusion. Asymmetric elevation of the right hemidiaphragm with persistent opacity in the right lower lung. Mild asymmetric edema noted within the right lung with increase interstitial markings. IMPRESSION: 1. Mild asymmetric edema within the  right lung. 2. Persistent opacity in the right lower lung which may reflect atelectasis or pneumonia. Electronically Signed   By: Signa Kell M.D.   On: 07/20/2023 09:32   [place] EKG SITE RITE Result Date: 07/19/2023 If Site Rite image not attached, placement could not be confirmed due to current cardiac rhythm.  DG Chest Port 1 View Result Date: 07/18/2023 CLINICAL DATA:  Shortness of breath EXAM: PORTABLE CHEST 1 VIEW COMPARISON:  06/04/2023 CT and radiograph 06/03/2023 FINDINGS: Right cardiac border obscured by airspace opacity in the right lower lobe and right middle lobe. Much of this is likely due to passive atelectasis from underlying pleural effusion. This is increased in prominence compared to 06/03/2023. Strictly speaking, underlying consolidation/pneumonia cannot be totally excluded. Blunting of the left lateral costophrenic angle compatible small left pleural effusion. At least mild enlargement of the cardiopericardial silhouette noted. Tortuous thoracic aorta. IMPRESSION: 1. Airspace opacity in the right lower lobe and right middle lobe, increased in prominence compared to 06/03/2023. Much of this is likely due to passive atelectasis from underlying pleural effusion. Strictly speaking, underlying consolidation/pneumonia cannot be totally excluded. 2. Small left pleural effusion. 3. At least mild enlargement of the cardiopericardial silhouette. Electronically Signed   By: Gaylyn Rong M.D.   On: 07/18/2023 17:15  Scheduled Meds:  Chlorhexidine Gluconate Cloth  6 each Topical Daily   cyanocobalamin  1,000 mcg Oral Daily   folic acid  1 mg Oral Daily   heparin  5,000 Units Subcutaneous Q8H   potassium chloride  40 mEq Oral Q4H   sodium chloride flush  10-40 mL Intracatheter Q12H   sodium chloride flush  3 mL Intravenous Q12H   Continuous Infusions:  furosemide (LASIX) 200 mg in dextrose 5 % 100 mL (2 mg/mL) infusion 10 mg/hr (07/20/23 0653)   milrinone 0.25 mcg/kg/min (07/20/23  0751)     LOS: 2 days    Time spent: 50m9n    Zannie Cove, MD [facility]   07/20/2023, 10:18 AM

## 2023-07-21 LAB — BASIC METABOLIC PANEL
Anion gap: 15 (ref 5–15)
Anion gap: 11 (ref 5–15)
BUN: 26 mg/dL — ABNORMAL HIGH (ref 8–23)
BUN: 29 mg/dL — ABNORMAL HIGH (ref 8–23)
CO2: 34 mmol/L — ABNORMAL HIGH (ref 22–32)
CO2: 35 mmol/L — ABNORMAL HIGH (ref 22–32)
Calcium: 9 mg/dL (ref 8.9–10.3)
Calcium: 8.9 mg/dL (ref 8.9–10.3)
Chloride: 85 mmol/L — ABNORMAL LOW (ref 98–111)
Chloride: 85 mmol/L — ABNORMAL LOW (ref 98–111)
Creatinine, Ser: 1.46 mg/dL — ABNORMAL HIGH (ref 0.61–1.24)
Creatinine, Ser: 1.53 mg/dL — ABNORMAL HIGH (ref 0.61–1.24)
GFR, Estimated: 52 mL/min — ABNORMAL LOW (ref >60)
GFR, Estimated: 50 mL/min — ABNORMAL LOW (ref >60)
Glucose, Bld: 149 mg/dL — ABNORMAL HIGH (ref 70–99)
Glucose, Bld: 150 mg/dL — ABNORMAL HIGH (ref 70–99)
Potassium: 3.2 mmol/L — ABNORMAL LOW (ref 3.5–5.1)
Potassium: 4.5 mmol/L (ref 3.5–5.1)
Sodium: 134 mmol/L — ABNORMAL LOW (ref 135–145)
Sodium: 131 mmol/L — ABNORMAL LOW (ref 135–145)

## 2023-07-21 LAB — CBC WITH DIFFERENTIAL/PLATELET
Abs Immature Granulocytes: 0.02 10*3/uL (ref 0.00–0.07)
Basophils Absolute: 0 10*3/uL (ref 0.0–0.1)
Basophils Relative: 0 %
Eosinophils Absolute: 0.1 10*3/uL (ref 0.0–0.5)
Eosinophils Relative: 1 %
HCT: 40.3 % (ref 39.0–52.0)
Hemoglobin: 13.7 g/dL (ref 13.0–17.0)
Immature Granulocytes: 0 %
Lymphocytes Relative: 24 %
Lymphs Abs: 1.6 10*3/uL (ref 0.7–4.0)
MCH: 31.3 pg (ref 26.0–34.0)
MCHC: 34 g/dL (ref 30.0–36.0)
MCV: 92 fL (ref 80.0–100.0)
Monocytes Absolute: 0.4 10*3/uL (ref 0.1–1.0)
Monocytes Relative: 7 %
Neutro Abs: 4.5 10*3/uL (ref 1.7–7.7)
Neutrophils Relative %: 68 %
Platelets: 98 10*3/uL — ABNORMAL LOW (ref 150–400)
RBC: 4.38 MIL/uL (ref 4.22–5.81)
RDW: 16.6 % — ABNORMAL HIGH (ref 11.5–15.5)
WBC: 6.6 10*3/uL (ref 4.0–10.5)
nRBC: 0 % (ref 0.0–0.2)

## 2023-07-21 LAB — COOXEMETRY PANEL
Carboxyhemoglobin: 1.6 % — ABNORMAL HIGH (ref 0.5–1.5)
Methemoglobin: 0.7 % (ref 0.0–1.5)
O2 Saturation: 68.3 %
Total hemoglobin: 14 g/dL (ref 12.0–16.0)

## 2023-07-21 LAB — MAGNESIUM: Magnesium: 2.1 mg/dL (ref 1.7–2.4)

## 2023-07-21 LAB — RPR: RPR Ser Ql: NONREACTIVE

## 2023-07-21 MED ORDER — POTASSIUM CHLORIDE CRYS ER 20 MEQ PO TBCR
60.0000 meq | EXTENDED_RELEASE_TABLET | ORAL | Status: AC
  Administered 2023-07-21: 60 meq via ORAL
  Filled 2023-07-21: qty 3

## 2023-07-21 MED ORDER — DAPAGLIFLOZIN PROPANEDIOL 10 MG PO TABS
10.0000 mg | ORAL_TABLET | Freq: Every day | ORAL | Status: DC
  Administered 2023-07-21 – 2023-07-22 (×2): 10 mg via ORAL
  Filled 2023-07-21 (×2): qty 1

## 2023-07-21 MED ORDER — SPIRONOLACTONE 12.5 MG HALF TABLET
12.5000 mg | ORAL_TABLET | Freq: Every day | ORAL | Status: DC
  Administered 2023-07-21: 12.5 mg via ORAL
  Filled 2023-07-21: qty 1

## 2023-07-21 MED ORDER — POTASSIUM CHLORIDE CRYS ER 20 MEQ PO TBCR
60.0000 meq | EXTENDED_RELEASE_TABLET | ORAL | Status: AC
  Administered 2023-07-21 (×2): 60 meq via ORAL
  Filled 2023-07-21 (×2): qty 3

## 2023-07-21 NOTE — Progress Notes (Signed)
Patient ID: [name] Daniel, male   DOB: 14-Feb-1956, 68 y.o.   MRN: [id number]     Advanced Heart Failure Rounding Note  Cardiologist: Dina Rich, MD  Chief Complaint: CHF Subjective:    Great diuresis yesterday, I/Os net negative 5000 cc on Lasix gtt 12 mg/hr.  CVP 7-8 this morning.  SBP 90s-100s.  Creatinine 1.66 => 1.46.  Co-ox 68% on milrinone 0.375.   Reports some leg cramping.  Says breathing is "ok."    Objective:   Weight Range: 67.7 kg Body mass index is 19.68 kg/m.   Vital Signs:   Temp:  [97.5 F (36.4 C)-98.5 F (36.9 C)] 97.7 F (36.5 C) (02/16 0718) Pulse Rate:  [104-113] 113 (02/16 0718) Resp:  [14-19] 17 (02/16 0718) BP: (94-106)/(78-86) 98/84 (02/16 0718) SpO2:  [92 %-98 %] 92 % (02/16 0718) Weight:  [67.7 kg] 67.7 kg (02/16 0427)    Weight change: Filed Weights   07/19/23 1835 07/20/23 0501 07/21/23 0427  Weight: 76.8 kg 73.8 kg 67.7 kg    Intake/Output:   Intake/Output Summary (Last 24 hours) at 07/21/2023 1030 Last data filed at 07/21/2023 0900 Gross per 24 hour  Intake 2293.55 ml  Output 6250 ml  Net -3956.45 ml      Physical Exam    General:  Well appearing. No resp difficulty HEENT: Normal Neck: Supple. JVP 8-9 cm. Carotids 2+ bilat; no bruits. No lymphadenopathy or thyromegaly appreciated. Cor: PMI lateral. Regular rate & rhythm. 2/6 SEM RUSB. Lungs: Decreased at bases.  Abdomen: Soft, nontender, nondistended. No hepatosplenomegaly. No bruits or masses. Good bowel sounds. Extremities: No cyanosis, clubbing, rash, Trace ankle edema.  Neuro: Alert & orientedx3, cranial nerves grossly intact. moves all 4 extremities w/o difficulty. Affect pleasant   Telemetry   NSR 100s (personally reviewed)  Labs    CBC Recent Labs    07/20/23 0500 07/21/23 0558  WBC 7.1 6.6  NEUTROABS 4.9 4.5  HGB 13.7 13.7  HCT 39.9 40.3  MCV 91.9 92.0  PLT 97* 98*   Basic Metabolic Panel Recent Labs    86/57/84 2100 07/21/23 0558  NA 132*  134*  K 3.9 3.2*  CL 89* 85*  CO2 30 34*  GLUCOSE 157* 149*  BUN 33* 26*  CREATININE 1.66* 1.46*  CALCIUM 8.6* 9.0  MG  --  2.1   Liver Function Tests Recent Labs    07/18/23 1609  AST 52*  ALT 43  ALKPHOS 63  BILITOT 2.3*  PROT 6.8  ALBUMIN 4.1   No results for input(s): "LIPASE", "AMYLASE" in the last 72 hours. Cardiac Enzymes No results for input(s): "CKTOTAL", "CKMB", "CKMBINDEX", "TROPONINI" in the last 72 hours.  BNP: BNP (last 3 results) Recent Labs    06/03/23 2152 07/18/23 1609  BNP 3,234.0* >4,500.0*    ProBNP (last 3 results) No results for input(s): "PROBNP" in the last 8760 hours.   D-Dimer No results for input(s): "DDIMER" in the last 72 hours. Hemoglobin A1C Recent Labs    07/20/23 1053  HGBA1C 6.6*   Fasting Lipid Panel No results for input(s): "CHOL", "HDL", "LDLCALC", "TRIG", "CHOLHDL", "LDLDIRECT" in the last 72 hours. Thyroid Function Tests Recent Labs    07/20/23 1053  TSH 2.193    Other results:   Imaging    No results found.   Medications:     Scheduled Medications:  aspirin  81 mg Oral Daily   Chlorhexidine Gluconate Cloth  6 each Topical Daily   cyanocobalamin  1,000 mcg Oral Daily  dapagliflozin propanediol  10 mg Oral Daily   digoxin  0.125 mg Oral Daily   folic acid  1 mg Oral Daily   heparin  5,000 Units Subcutaneous Q8H   potassium chloride  60 mEq Oral Q4H   rosuvastatin  20 mg Oral Daily   sodium chloride flush  10-40 mL Intracatheter Q12H   sodium chloride flush  3 mL Intravenous Q12H   spironolactone  12.5 mg Oral Daily    Infusions:  furosemide (LASIX) 200 mg in dextrose 5 % 100 mL (2 mg/mL) infusion 12 mg/hr (07/20/23 2113)   milrinone 0.375 mcg/kg/min (07/21/23 0805)    PRN Medications: acetaminophen, ondansetron (ZOFRAN) IV, mouth rinse, sodium chloride flush, sodium chloride flush, traMADol, zolpidem   Assessment/Plan   1. Acute on chronic systolic CHF/cardiogenic shock: Echo in 12/24  showed EF 20%, mild RV dysfunction, severe low flow/low gradient aortic stenosis with mean gradient 35 mmHg and AVA 0.49 cm^2. 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  He has severe aortic stenosis.  He also has anterior Qs on ECG so prior MI is certainly possible. He was re-admitted with marked volume overload and low cardiac output, co-ox 38%.  He was started on milrinone which was increased to 0.375 and has been diuresed with Lasix gtt 12 mg/hr.  Excellent diuresis, weight down 13 lbs.  CVP now around 8 with mild JVD. Co-ox 68% today.  - Continue milrinone 0.375 for now.  - Decrease Lasix gtt to 6 mg/hr with improved CVP and replace K.  Repeat BMET in pm.  - Continue spironolactone 12.5 daily.  - Add Farxiga 10 daily.  - Continue digoxin 0.125 daily.  - Will need RHC/LHC on Monday.  May need IABP.  - Unna boots 2. Elevated troponin: Mild elevation with no trend. Suspect demand ischemia with volume overload.  - Continue statin - Continue ASA 81 3. Aortic stenosis: Low flow/low gradient severe AS on 12/24 echo. This contributes to CHF.  He will ultimately need the aortic valve replaced.  4. Thrombocytopenia: Platelets have been mildly low since 12/24.  Follow.  5. Ascending aortic aneurysm: 5.0 cm on 12/24 CTA chest.  6. H/o ETOH abuse: Not very forthcoming on current drinking habits.   Length of Stay: 3  Marca Ancona, MD  07/21/2023, 10:30 AM  Advanced Heart Failure Team Pager [phone] (M-F; 7a - 5p)  Please contact [facility] for night-coverage after hours (5p -7a ) and weekends on [email or web address]

## 2023-07-21 NOTE — Progress Notes (Signed)
PROGRESS NOTE    [name] Daniel  ZOX:[id number] DOB: 05/08/1956 DOA: 07/18/2023 PCP: Patient, No Pcp Per  67/M with recently diagnosed systolic CHF, at [facility] discharged on 1/3, refused cardiac cath then, prior to that hospitalized in December for incarcerated hernia, underwent mesh hernia repair. -Back in the ED 2/13 with shortness of breath lower extremity edema and volume overload, noted to have 3+ edema, scrotal edema, BNP> 4500, creatinine 1.5, troponin 68, chest x-ray with pleural effusion and volume overload, TX to [facility] for Cards eval  -2/15 started on milrinone with IV Lasix  Subjective: -Feels better overall, poor historian  Assessment and Plan:  Acute on chronic systolic CHF Cardiomyopathy -Recent echo 12/24 with EF 20%, mildly reduced RV, severe low-flow low gradient aortic stenosis, mild to moderate pericardial effusion -Declined LHC then, now agreeable for workup -Cards following, diuresing well, 9.3 L negative, Lasix drip decreased, continue milrinone -EtOH use could be contributing to his cardiomyopathy -Caution with GDMT considering soft blood pressures and severe AS -Plan for right and left heart cath tomorrow  Severe AS -Per cards  Severe hypokalemia Replace, check mag  Hyperglycemia, new diabetes mellitus -A1c is 6.6, now starting Farxiga   Thrombocytopenia -Platelet count at baseline   Folic acid deficiency -Continue with supplement, also check B12 and anemia panel  Cognitive deficits -Could be EtOH related -TSH is normal, follow-up RPR  History of EtOH useitor -Reportedly quit 6 months ago   Ascending aortic aneurysm -Incidental finding on CTA chest recent admission -outpatient surveillance     DVT prophylaxis: Heparin subcutaneous Code Status: Full code Family Communication: No family at bedside Disposition Plan: Home pending above workup  Consultants: Cards   Procedures:   Antimicrobials:    Objective: Vitals:   07/21/23 0002  07/21/23 0408 07/21/23 0427 07/21/23 0718  BP: 95/78 106/84  98/84  Pulse: (!) 107 (!) 104  (!) 113  Resp: 17 16  17   Temp: 98.5 F (36.9 C) 98.5 F (36.9 C)  97.7 F (36.5 C)  TempSrc: Oral Oral  Oral  SpO2: 98% 97%  92%  Weight:   67.7 kg   Height:        Intake/Output Summary (Last 24 hours) at 07/21/2023 1058 Last data filed at 07/21/2023 1042 Gross per 24 hour  Intake 2293.55 ml  Output 7050 ml  Net -4756.45 ml   Filed Weights   07/19/23 1835 07/20/23 0501 07/21/23 0427  Weight: 76.8 kg 73.8 kg 67.7 kg    Examination:  General exam: Chronically ill male laying in bed, AAO x 2, cognitive deficits HEENT: Positive JVD CVS: S1-S2, regular rhythm, systolic murmur Lungs: Poor air movement bilaterally, decreased at the bases Abdomen: Soft, nontender, bowel sounds present Extremities: 1+ edema , scrotal swelling improving  Skin: No rashes Psychiatry: Poor insight    Data Reviewed:   CBC: Recent Labs  Lab 07/18/23 1609 07/19/23 0525 07/20/23 0500 07/21/23 0558  WBC 5.8 6.6 7.1 6.6  NEUTROABS 3.8  --  4.9 4.5  HGB 14.0 13.6 13.7 13.7  HCT 41.8 41.8 39.9 40.3  MCV 95.4 94.8 91.9 92.0  PLT 120* 124* 97* 98*   Basic Metabolic Panel: Recent Labs  Lab 07/18/23 1609 07/19/23 0525 07/20/23 0827 07/20/23 2100 07/21/23 0558  NA 132* 132* 134* 132* 134*  K 4.2 4.0 2.7* 3.9 3.2*  CL 98 97* 90* 89* 85*  CO2 22 22 27 30  34*  GLUCOSE 134* 124* 122* 157* 149*  BUN 35* 36* 33* 33* 26*  CREATININE  1.52* 1.56* 1.60* 1.66* 1.46*  CALCIUM 9.3 8.7* 8.8* 8.6* 9.0  MG  --   --   --   --  2.1   GFR: Estimated Creatinine Clearance: 47 mL/min (A) (by C-G formula based on SCr of 1.46 mg/dL (H)). Liver Function Tests: Recent Labs  Lab 07/18/23 1609  AST 52*  ALT 43  ALKPHOS 63  BILITOT 2.3*  PROT 6.8  ALBUMIN 4.1   No results for input(s): "LIPASE", "AMYLASE" in the last 168 hours. No results for input(s): "AMMONIA" in the last 168 hours. Coagulation Profile: No  results for input(s): "INR", "PROTIME" in the last 168 hours. Cardiac Enzymes: No results for input(s): "CKTOTAL", "CKMB", "CKMBINDEX", "TROPONINI" in the last 168 hours. BNP (last 3 results) No results for input(s): "PROBNP" in the last 8760 hours. HbA1C: Recent Labs    07/20/23 1053  HGBA1C 6.6*   CBG: No results for input(s): "GLUCAP" in the last 168 hours. Lipid Profile: No results for input(s): "CHOL", "HDL", "LDLCALC", "TRIG", "CHOLHDL", "LDLDIRECT" in the last 72 hours. Thyroid Function Tests: Recent Labs    07/20/23 1053  TSH 2.193   Anemia Panel: Recent Labs    07/20/23 1053  VITAMINB12 1,020*  FOLATE >40.0  FERRITIN 138  TIBC 354  IRON 50  RETICCTPCT 2.4   Urine analysis: No results found for: "COLORURINE", "APPEARANCEUR", "LABSPEC", "PHURINE", "GLUCOSEU", "HGBUR", "BILIRUBINUR", "KETONESUR", "PROTEINUR", "UROBILINOGEN", "NITRITE", "LEUKOCYTESUR" Sepsis Labs: @LABRCNTIP (procalcitonin:4,lacticidven:4)  ) Recent Results (from the past 240 hours)  MRSA Next Gen by PCR, Nasal     Status: None   Collection Time: 07/18/23  9:15 PM   Specimen: Nasal Mucosa; Nasal Swab  Result Value Ref Range Status   MRSA by PCR Next Gen NOT DETECTED NOT DETECTED Final    Comment: (NOTE) The GeneXpert MRSA Assay (FDA approved for NASAL specimens only), is one component of a comprehensive MRSA colonization surveillance program. It is not intended to diagnose MRSA infection nor to guide or monitor treatment for MRSA infections. Test performance is not FDA approved in patients less than 42 years old. Performed at [facility], [address] [place], [place] [address]      Radiology Studies: DG Chest 2 View Result Date: 07/20/2023 CLINICAL DATA:  Leg swelling EXAM: CHEST - 2 VIEW COMPARISON:  07/18/2023 FINDINGS: There is a right arm PICC line with tip in the superior cavoatrial due junction. Stable cardiomediastinal contours. No significant pleural effusion. Asymmetric  elevation of the right hemidiaphragm with persistent opacity in the right lower lung. Mild asymmetric edema noted within the right lung with increase interstitial markings. IMPRESSION: 1. Mild asymmetric edema within the right lung. 2. Persistent opacity in the right lower lung which may reflect atelectasis or pneumonia. Electronically Signed   By: Signa Kell M.D.   On: 07/20/2023 09:32   [place] EKG SITE RITE Result Date: 07/19/2023 If Site Rite image not attached, placement could not be confirmed due to current cardiac rhythm.    Scheduled Meds:  aspirin  81 mg Oral Daily   Chlorhexidine Gluconate Cloth  6 each Topical Daily   cyanocobalamin  1,000 mcg Oral Daily   dapagliflozin propanediol  10 mg Oral Daily   digoxin  0.125 mg Oral Daily   folic acid  1 mg Oral Daily   heparin  5,000 Units Subcutaneous Q8H   potassium chloride  60 mEq Oral Q4H   rosuvastatin  20 mg Oral Daily   sodium chloride flush  10-40 mL Intracatheter Q12H   sodium chloride  flush  3 mL Intravenous Q12H   spironolactone  12.5 mg Oral Daily   Continuous Infusions:  furosemide (LASIX) 200 mg in dextrose 5 % 100 mL (2 mg/mL) infusion 6 mg/hr (07/21/23 1042)   milrinone 0.375 mcg/kg/min (07/21/23 0805)     LOS: 3 days    Time spent:    Zannie Cove, MD [facility]   07/21/2023, 10:58 AM

## 2023-07-21 NOTE — Plan of Care (Signed)

## 2023-07-22 ENCOUNTER — Inpatient Hospital Stay (HOSPITAL_COMMUNITY)
Admission: EM | Disposition: A | Discharge status: Home Health | Payer: Self-pay | Source: Home / Self Care | Attending: Internal Medicine

## 2023-07-22 ENCOUNTER — Encounter (HOSPITAL_COMMUNITY): Payer: Self-pay | Admitting: Cardiology

## 2023-07-22 DIAGNOSIS — R570 Cardiogenic shock: Secondary | ICD-10-CM

## 2023-07-22 HISTORY — PX: IABP INSERTION: CATH118242

## 2023-07-22 HISTORY — PX: RIGHT HEART CATH AND CORONARY ANGIOGRAPHY: CATH118264

## 2023-07-22 LAB — CBC WITH DIFFERENTIAL/PLATELET
Abs Immature Granulocytes: 0.03 10*3/uL (ref 0.00–0.07)
Basophils Absolute: 0 10*3/uL (ref 0.0–0.1)
Basophils Relative: 0 %
Eosinophils Absolute: 0 10*3/uL (ref 0.0–0.5)
Eosinophils Relative: 0 %
HCT: 42.6 % (ref 39.0–52.0)
Hemoglobin: 14.7 g/dL (ref 13.0–17.0)
Immature Granulocytes: 0 %
Lymphocytes Relative: 23 %
Lymphs Abs: 1.6 10*3/uL (ref 0.7–4.0)
MCH: 31.4 pg (ref 26.0–34.0)
MCHC: 34.5 g/dL (ref 30.0–36.0)
MCV: 91 fL (ref 80.0–100.0)
Monocytes Absolute: 0.5 10*3/uL (ref 0.1–1.0)
Monocytes Relative: 8 %
Neutro Abs: 4.6 10*3/uL (ref 1.7–7.7)
Neutrophils Relative %: 69 %
Platelets: 106 10*3/uL — ABNORMAL LOW (ref 150–400)
RBC: 4.68 MIL/uL (ref 4.22–5.81)
RDW: 16.6 % — ABNORMAL HIGH (ref 11.5–15.5)
WBC: 6.8 10*3/uL (ref 4.0–10.5)
nRBC: 0 % (ref 0.0–0.2)

## 2023-07-22 LAB — COOXEMETRY PANEL
Carboxyhemoglobin: 2.1 % — ABNORMAL HIGH (ref 0.5–1.5)
Carboxyhemoglobin: 1.4 % (ref 0.5–1.5)
Methemoglobin: 0.9 % (ref 0.0–1.5)
Methemoglobin: <0.7 % (ref 0.0–1.5)
O2 Saturation: 78.4 %
O2 Saturation: 58.3 %
Total hemoglobin: 15.2 g/dL (ref 12.0–16.0)
Total hemoglobin: 14.6 g/dL (ref 12.0–16.0)

## 2023-07-22 LAB — BASIC METABOLIC PANEL
Anion gap: 15 (ref 5–15)
BUN: 27 mg/dL — ABNORMAL HIGH (ref 8–23)
CO2: 35 mmol/L — ABNORMAL HIGH (ref 22–32)
Calcium: 9 mg/dL (ref 8.9–10.3)
Chloride: 83 mmol/L — ABNORMAL LOW (ref 98–111)
Creatinine, Ser: 1.55 mg/dL — ABNORMAL HIGH (ref 0.61–1.24)
GFR, Estimated: 49 mL/min — ABNORMAL LOW (ref >60)
Glucose, Bld: 122 mg/dL — ABNORMAL HIGH (ref 70–99)
Potassium: 3.7 mmol/L (ref 3.5–5.1)
Sodium: 133 mmol/L — ABNORMAL LOW (ref 135–145)

## 2023-07-22 LAB — POCT I-STAT EG7
Acid-Base Excess: 13 mmol/L — ABNORMAL HIGH (ref 0.0–2.0)
Acid-Base Excess: 14 mmol/L — ABNORMAL HIGH (ref 0.0–2.0)
Bicarbonate: 39.8 mmol/L — ABNORMAL HIGH (ref 20.0–28.0)
Bicarbonate: 40.7 mmol/L — ABNORMAL HIGH (ref 20.0–28.0)
Calcium, Ion: 1.1 mmol/L — ABNORMAL LOW (ref 1.15–1.40)
Calcium, Ion: 1.1 mmol/L — ABNORMAL LOW (ref 1.15–1.40)
HCT: 47 % (ref 39.0–52.0)
HCT: 47 % (ref 39.0–52.0)
Hemoglobin: 16 g/dL (ref 13.0–17.0)
Hemoglobin: 16 g/dL (ref 13.0–17.0)
O2 Saturation: 58 %
O2 Saturation: 60 %
Potassium: 3.8 mmol/L (ref 3.5–5.1)
Potassium: 3.8 mmol/L (ref 3.5–5.1)
Sodium: 133 mmol/L — ABNORMAL LOW (ref 135–145)
Sodium: 132 mmol/L — ABNORMAL LOW (ref 135–145)
TCO2: 41 mmol/L — ABNORMAL HIGH (ref 22–32)
TCO2: 42 mmol/L — ABNORMAL HIGH (ref 22–32)
pCO2, Ven: 54.2 mm[Hg] (ref 44–60)
pCO2, Ven: 55.4 mm[Hg] (ref 44–60)
pH, Ven: 7.475 — ABNORMAL HIGH (ref 7.25–7.43)
pH, Ven: 7.474 — ABNORMAL HIGH (ref 7.25–7.43)
pO2, Ven: 29 mm[Hg] — CL (ref 32–45)
pO2, Ven: 30 mm[Hg] — CL (ref 32–45)

## 2023-07-22 LAB — HEPARIN LEVEL (UNFRACTIONATED): Heparin Unfractionated: 0.46 [IU]/mL (ref 0.30–0.70)

## 2023-07-22 LAB — MAGNESIUM: Magnesium: 2.3 mg/dL (ref 1.7–2.4)

## 2023-07-22 SURGERY — RIGHT HEART CATH AND CORONARY ANGIOGRAPHY
Anesthesia: LOCAL | Site: Groin | Laterality: Right

## 2023-07-22 MED ORDER — MELATONIN 3 MG PO TABS
3.0000 mg | ORAL_TABLET | Freq: Once | ORAL | Status: AC
  Administered 2023-07-22: 3 mg via ORAL
  Filled 2023-07-22: qty 1

## 2023-07-22 MED ORDER — HEPARIN (PORCINE) 25000 UT/250ML-% IV SOLN
650.0000 [IU]/h | INTRAVENOUS | Status: DC
  Administered 2023-07-22: 600 [IU]/h via INTRAVENOUS
  Administered 2023-07-24: 650 [IU]/h via INTRAVENOUS
  Filled 2023-07-22 (×2): qty 250

## 2023-07-22 MED ORDER — HEPARIN (PORCINE) IN NACL 1000-0.9 UT/500ML-% IV SOLN
INTRAVENOUS | Status: DC | PRN
Start: 2023-07-22 — End: 2023-07-22
  Administered 2023-07-22 (×3): 500 mL

## 2023-07-22 MED ORDER — MIDAZOLAM HCL 2 MG/2ML IJ SOLN
INTRAMUSCULAR | Status: DC | PRN
  Administered 2023-07-22 (×2): .5 mg via INTRAVENOUS

## 2023-07-22 MED ORDER — FENTANYL CITRATE (PF) 100 MCG/2ML IJ SOLN
INTRAMUSCULAR | Status: AC
  Filled 2023-07-22: qty 2

## 2023-07-22 MED ORDER — HEPARIN SODIUM (PORCINE) 1000 UNIT/ML IJ SOLN
INTRAMUSCULAR | Status: AC
  Filled 2023-07-22: qty 10

## 2023-07-22 MED ORDER — FENTANYL CITRATE (PF) 100 MCG/2ML IJ SOLN
INTRAMUSCULAR | Status: DC | PRN
  Administered 2023-07-22: 25 ug via INTRAVENOUS

## 2023-07-22 MED ORDER — DIAZEPAM 2 MG PO TABS
2.0000 mg | ORAL_TABLET | Freq: Once | ORAL | Status: AC
  Administered 2023-07-22: 2 mg via ORAL
  Filled 2023-07-22: qty 1

## 2023-07-22 MED ORDER — IOHEXOL 350 MG/ML SOLN
INTRAVENOUS | Status: DC | PRN
  Administered 2023-07-22: 70 mL

## 2023-07-22 MED ORDER — LIDOCAINE HCL (PF) 1 % IJ SOLN
INTRAMUSCULAR | Status: DC | PRN
  Administered 2023-07-22: 2 mL
  Administered 2023-07-22: 12 mL

## 2023-07-22 MED ORDER — ASPIRIN 81 MG PO CHEW
81.0000 mg | CHEWABLE_TABLET | ORAL | Status: AC
  Administered 2023-07-22: 81 mg via ORAL
  Filled 2023-07-22: qty 1

## 2023-07-22 MED ORDER — SODIUM CHLORIDE 0.9 % IV SOLN: INTRAVENOUS | Status: AC

## 2023-07-22 MED ORDER — MIDAZOLAM HCL 2 MG/2ML IJ SOLN
INTRAMUSCULAR | Status: AC
  Filled 2023-07-22: qty 2

## 2023-07-22 MED ORDER — POTASSIUM CHLORIDE CRYS ER 20 MEQ PO TBCR
40.0000 meq | EXTENDED_RELEASE_TABLET | ORAL | Status: AC
  Administered 2023-07-22: 40 meq via ORAL
  Filled 2023-07-22: qty 2

## 2023-07-22 MED ORDER — LIDOCAINE HCL (PF) 1 % IJ SOLN
INTRAMUSCULAR | Status: AC
  Filled 2023-07-22: qty 30

## 2023-07-22 MED ORDER — VERAPAMIL HCL 2.5 MG/ML IV SOLN
INTRAVENOUS | Status: AC
  Filled 2023-07-22: qty 2

## 2023-07-22 MED ORDER — SODIUM CHLORIDE 0.9 % IV SOLN: INTRAVENOUS | Status: DC

## 2023-07-22 MED ORDER — DOBUTAMINE-DEXTROSE 4-5 MG/ML-% IV SOLN
2.5000 ug/kg/min | INTRAVENOUS | Status: DC
  Administered 2023-07-22 – 2023-07-24 (×2): 5 ug/kg/min via INTRAVENOUS
  Administered 2023-07-26 – 2023-07-30 (×2): 2.5 ug/kg/min via INTRAVENOUS
  Filled 2023-07-22 (×4): qty 250

## 2023-07-22 MED ORDER — SODIUM CHLORIDE 0.9 % IV SOLN
INTRAVENOUS | Status: AC | PRN
Start: 2023-07-22 — End: 2023-07-23

## 2023-07-22 MED ORDER — HEPARIN SODIUM (PORCINE) 1000 UNIT/ML IJ SOLN
INTRAMUSCULAR | Status: DC | PRN
  Administered 2023-07-22: 5000 [IU] via INTRAVENOUS

## 2023-07-22 SURGICAL SUPPLY — 22 items
BALLN IABP SENSA PLUS 8F 50CC (BALLOONS) ×2 IMPLANT
BALLOON IABP SENS PLUS 8F 50CC (BALLOONS) IMPLANT
CATH INFINITI 5 FR 3DRC (CATHETERS) IMPLANT
CATH INFINITI 5FR JL5 (CATHETERS) IMPLANT
CATH INFINITI 5FR MULTPACK ANG (CATHETERS) IMPLANT
CATH SWAN GANZ 7F STRAIGHT (CATHETERS) IMPLANT
CATH-GARD ARROW CATH SHIELD (MISCELLANEOUS) ×2 IMPLANT
GLIDESHEATH SLEND SS 6F .021 (SHEATH) IMPLANT
GUIDEWIRE INQWIRE 1.5J.035X260 (WIRE) IMPLANT
GUIDEWIRE SAF TJ AMPL .035X180 (WIRE) IMPLANT
INQWIRE 1.5J .035X260CM (WIRE) ×2 IMPLANT
SET ATX-X65L (MISCELLANEOUS) IMPLANT
SHEATH GLIDE SLENDER 4/5FR (SHEATH) IMPLANT
SHEATH PINNACLE 6F 10CM (SHEATH) IMPLANT
SHEATH PINNACLE 7F 10CM (SHEATH) IMPLANT
SHEATH PROBE COVER 6X72 (BAG) IMPLANT
SHIELD CATHGARD ARROW (MISCELLANEOUS) IMPLANT
TUBING CIL FLEX 10 FLL-RA (TUBING) IMPLANT
WIRE EMERALD 3MM-J .035X150CM (WIRE) IMPLANT
WIRE EMERALD ST .035X260CM (WIRE) IMPLANT
WIRE MICRO SET SILHO 5FR 7 (SHEATH) IMPLANT
WIRE MICROINTRODUCER 60CM (WIRE) IMPLANT

## 2023-07-22 NOTE — Progress Notes (Signed)
Orthopedic Tech Progress Note Patient Details:  [name] Daniel 1955/09/26 [id number]  Ortho Devices Type of Ortho Device: Knee Immobilizer Ortho Device/Splint Location: RLE Ortho Device/Splint Interventions: Application, Ordered   Post Interventions Patient Tolerated: Well  [name] 07/22/2023, 5:44 PM

## 2023-07-22 NOTE — H&P (View-Only) (Signed)
Patient ID: [name] Daniel, male   DOB: Jun 09, 1955, 68 y.o.   MRN: [id number]     Advanced Heart Failure Rounding Note  Cardiologist: Dina Rich, MD  Chief Complaint: CHF Subjective:    Great diuresis again yesterday, I/Os net negative 4574 cc on Lasix gtt 6 mg/hr.  CVP <5  this morning.  SBP 90s-100s.  Creatinine 1.66 => 1.46 => 1.55.  Co-ox 78% on milrinone 0.375.   Not very interactive.  Says his breathing is fine.    Objective:   Weight Range: 67.6 kg Body mass index is 19.66 kg/m.   Vital Signs:   Temp:  [98.5 F (36.9 C)-99.3 F (37.4 C)] 98.5 F (36.9 C) (02/17 0402) Pulse Rate:  [104-137] 112 (02/17 0415) Resp:  [16-18] 16 (02/17 0402) BP: (96-106)/(81-87) 96/81 (02/17 0402) SpO2:  [88 %-96 %] 93 % (02/17 0415) Weight:  [67.6 kg] 67.6 kg (02/17 0402)    Weight change: Filed Weights   07/20/23 0501 07/21/23 0427 07/22/23 0402  Weight: 73.8 kg 67.7 kg 67.6 kg    Intake/Output:   Intake/Output Summary (Last 24 hours) at 07/22/2023 0726 Last data filed at 07/22/2023 0600 Gross per 24 hour  Intake 1175.77 ml  Output 5750 ml  Net -4574.23 ml      Physical Exam    General: NAD, thin Neck: No JVD, no thyromegaly or thyroid nodule.  Lungs: Clear to auscultation bilaterally with normal respiratory effort. CV: Nondisplaced PMI.  Heart regular S1/S2, no S3/S4, 3/6 SEM RUSB.  No peripheral edema.    Abdomen: Soft, nontender, no hepatosplenomegaly, no distention.  Skin: Intact without lesions or rashes.  Neurologic: Drowsy, oriented.  Psych: Normal affect. Extremities: No clubbing or cyanosis.  HEENT: Normal.   Telemetry   NSR 100s (personally reviewed)  Labs    CBC Recent Labs    07/21/23 0558 07/22/23 0249  WBC 6.6 6.8  NEUTROABS 4.5 4.6  HGB 13.7 14.7  HCT 40.3 42.6  MCV 92.0 91.0  PLT 98* 106*   Basic Metabolic Panel Recent Labs    40/98/11 0558 07/21/23 1557 07/22/23 0249  NA 134* 131* 133*  K 3.2* 4.5 3.7  CL 85* 85* 83*  CO2  34* 35* 35*  GLUCOSE 149* 150* 122*  BUN 26* 29* 27*  CREATININE 1.46* 1.53* 1.55*  CALCIUM 9.0 8.9 9.0  MG 2.1  --   --    Liver Function Tests No results for input(s): "AST", "ALT", "ALKPHOS", "BILITOT", "PROT", "ALBUMIN" in the last 72 hours.  No results for input(s): "LIPASE", "AMYLASE" in the last 72 hours. Cardiac Enzymes No results for input(s): "CKTOTAL", "CKMB", "CKMBINDEX", "TROPONINI" in the last 72 hours.  BNP: BNP (last 3 results) Recent Labs    06/03/23 2152 07/18/23 1609  BNP 3,234.0* >4,500.0*    ProBNP (last 3 results) No results for input(s): "PROBNP" in the last 8760 hours.   D-Dimer No results for input(s): "DDIMER" in the last 72 hours. Hemoglobin A1C Recent Labs    07/20/23 1053  HGBA1C 6.6*   Fasting Lipid Panel No results for input(s): "CHOL", "HDL", "LDLCALC", "TRIG", "CHOLHDL", "LDLDIRECT" in the last 72 hours. Thyroid Function Tests Recent Labs    07/20/23 1053  TSH 2.193    Other results:   Imaging    No results found.   Medications:     Scheduled Medications:  aspirin  81 mg Oral Daily   Chlorhexidine Gluconate Cloth  6 each Topical Daily   cyanocobalamin  1,000 mcg Oral Daily  dapagliflozin propanediol  10 mg Oral Daily   digoxin  0.125 mg Oral Daily   folic acid  1 mg Oral Daily   heparin  5,000 Units Subcutaneous Q8H   potassium chloride  40 mEq Oral Q4H   rosuvastatin  20 mg Oral Daily   sodium chloride flush  10-40 mL Intracatheter Q12H   sodium chloride flush  3 mL Intravenous Q12H   spironolactone  12.5 mg Oral Daily    Infusions:  sodium chloride     milrinone 0.375 mcg/kg/min (07/21/23 2033)    PRN Medications: acetaminophen, ondansetron (ZOFRAN) IV, mouth rinse, sodium chloride flush, sodium chloride flush, traMADol, zolpidem   Assessment/Plan   1. Acute on chronic systolic CHF/cardiogenic shock: Echo in 12/24 showed EF 20%, mild RV dysfunction, severe low flow/low gradient aortic stenosis with  mean gradient 35 mmHg and AVA 0.49 cm^2. 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  He has severe aortic stenosis.  He also has anterior Qs on ECG so prior MI is certainly possible. He was re-admitted with marked volume overload and low cardiac output, co-ox 38%.  He was started on milrinone which was increased to 0.375 and has been diuresed with Lasix gtt 12 => 6 mg/hr.  Excellent diuresis again over the last day.  CVP now < 5. Co-ox 78% today.  - Continue milrinone 0.375 for now.  - Stop Lasix today, will give gentle pre-cath fluid 50 cc/hr x 3 hrs.   - Continue spironolactone 12.5 daily.  - Continue Farxiga 10 daily.  - Continue digoxin 0.125 daily.  - Will need RHC/LHC today.  May need IABP depending on CO.  I discussed risks/benefits of procedure with patient and he agreed to procedure.  - Unna boots 2. Elevated troponin: Mild elevation with no trend. Suspect demand ischemia with volume overload.  - Continue statin - Continue ASA 81 3. Aortic stenosis: Low flow/low gradient severe AS on 12/24 echo. This contributes to CHF.  He will ultimately need the aortic valve replaced.  4. Thrombocytopenia: Platelets have been mildly low since 12/24.  Follow.  5. Ascending aortic aneurysm: 5.0 cm on 12/24 CTA chest.  6. H/o ETOH abuse: Not very forthcoming on current drinking habits but apparently has quit for about 6 months.   Length of Stay: 4  Marca Ancona, MD  07/22/2023, 7:26 AM  Advanced Heart Failure Team Pager [phone] (M-F; 7a - 5p)  Please contact [facility] for night-coverage after hours (5p -7a ) and weekends on [email or web address]

## 2023-07-22 NOTE — Progress Notes (Signed)
Orthopedic Tech Progress Note Patient Details:  [name] Daniel 03/27/1956 [id number] Notified secretary to let RN know ill be around 1500 to apply fresh UNNA BOOTS  Patient ID: DESI ROWE, male   DOB: Nov 13, 1955, 68 y.o.   MRN: [id number]  Donald Pore 07/22/2023, 8:13 AM

## 2023-07-22 NOTE — Progress Notes (Signed)
 Heart Failure Navigator Progress Note  Assessed for Heart & Vascular TOC clinic readiness.  Patient does not meet criteria due to Advanced Heart Failure Team patient of Dr. Shirlee Latch.   Navigator will sign off at this time.    Rhae Hammock, BSN, Scientist, clinical (histocompatibility and immunogenetics) Only

## 2023-07-22 NOTE — Progress Notes (Signed)
PROGRESS NOTE    [name] Daniel  ZOX:[id number] DOB: Oct 17, 1955 DOA: 07/18/2023 PCP: Patient, No Pcp Per  67/M with recently diagnosed systolic CHF, at [facility] discharged on 1/3, refused cardiac cath then, prior to that hospitalized in December for incarcerated hernia, underwent mesh hernia repair. -Back in the ED 2/13 with shortness of breath lower extremity edema and volume overload, noted to have 3+ edema, scrotal edema, BNP> 4500, creatinine 1.5, troponin 68, chest x-ray with pleural effusion and volume overload, TX to [facility] for Cards eval  -2/15 started on milrinone with IV Lasix> brisk diuresis -2/17 for right and left heart cath  Subjective: -Poor historian, denies dyspnea or chest pain  Assessment and Plan:  Acute on chronic systolic CHF Cardiomyopathy -Recent echo 12/24 with EF 20%, mildly reduced RV, severe low-flow low gradient aortic stenosis, mild to moderate pericardial effusion -Declined LHC then, now agreeable for workup -CHF team following, diuresing well, 13.9  L negative, weight down 29 LB  -Holding further Lasix today, continue milrinone, for right and left heart cath -now on digoxin, Farxiga and Aldactone -EtOH use could be contributing to his cardiomyopathy -Caution with GDMT considering soft blood pressures and severe AS  Severe AS -Per cards, will ultimately need valve replacement  Severe hypokalemia Replaced  Hyperglycemia, new diabetes mellitus -A1c is 6.6, now on Farxiga   Mild thrombocytopenia -Platelet count stabilizing   Folic acid deficiency -Continue with supplement, also check B12 and anemia panel  Cognitive deficits -Could be EtOH related -TSH is normal, follow-up RPR  History of EtOH use -Reportedly quit 6 months ago -Add thiamine, multivitamin   Ascending aortic aneurysm -Incidental finding on CTA chest recent admission -outpatient surveillance   DVT prophylaxis: Heparin subcutaneous Code Status: Full code Family Communication: No  family at bedside Disposition Plan: Home pending above workup  Consultants: Cards   Procedures:   Antimicrobials:    Objective: Vitals:   07/22/23 0732 07/22/23 0733 07/22/23 1049 07/22/23 1128  BP: 92/77     Pulse: 64     Resp: 20     Temp: 98.5 F (36.9 C)     TempSrc: Oral Oral    SpO2: (!) 88%  92% 96%  Weight:      Height:        Intake/Output Summary (Last 24 hours) at 07/22/2023 1130 Last data filed at 07/22/2023 1027 Gross per 24 hour  Intake 703.77 ml  Output 4550 ml  Net -3846.23 ml   Filed Weights   07/20/23 0501 07/21/23 0427 07/22/23 0402  Weight: 73.8 kg 67.7 kg 67.6 kg    Examination:  General exam: Chronically ill male laying in bed, AAO x 2, cognitive deficits HEENT: No JVD CVS: S1-S2, regular rhythm, systolic murmur Lungs: Poor air movement bilaterally, decreased at the bases Abdomen: Soft, nontender, bowel sounds present Extremities: No edema, scrotal swelling improving Skin: No rashes Psychiatry: Poor insight    Data Reviewed:   CBC: Recent Labs  Lab 07/18/23 1609 07/19/23 0525 07/20/23 0500 07/21/23 0558 07/22/23 0249  WBC 5.8 6.6 7.1 6.6 6.8  NEUTROABS 3.8  --  4.9 4.5 4.6  HGB 14.0 13.6 13.7 13.7 14.7  HCT 41.8 41.8 39.9 40.3 42.6  MCV 95.4 94.8 91.9 92.0 91.0  PLT 120* 124* 97* 98* 106*   Basic Metabolic Panel: Recent Labs  Lab 07/20/23 0827 07/20/23 2100 07/21/23 0558 07/21/23 1557 07/22/23 0249  NA 134* 132* 134* 131* 133*  K 2.7* 3.9 3.2* 4.5 3.7  CL 90* 89* 85* 85*  83*  CO2 27 30 34* 35* 35*  GLUCOSE 122* 157* 149* 150* 122*  BUN 33* 33* 26* 29* 27*  CREATININE 1.60* 1.66* 1.46* 1.53* 1.55*  CALCIUM 8.8* 8.6* 9.0 8.9 9.0  MG  --   --  2.1  --  2.3   GFR: Estimated Creatinine Clearance: 44.2 mL/min (A) (by C-G formula based on SCr of 1.55 mg/dL (H)). Liver Function Tests: Recent Labs  Lab 07/18/23 1609  AST 52*  ALT 43  ALKPHOS 63  BILITOT 2.3*  PROT 6.8  ALBUMIN 4.1   No results for input(s):  "LIPASE", "AMYLASE" in the last 168 hours. No results for input(s): "AMMONIA" in the last 168 hours. Coagulation Profile: No results for input(s): "INR", "PROTIME" in the last 168 hours. Cardiac Enzymes: No results for input(s): "CKTOTAL", "CKMB", "CKMBINDEX", "TROPONINI" in the last 168 hours. BNP (last 3 results) No results for input(s): "PROBNP" in the last 8760 hours. HbA1C: Recent Labs    07/20/23 1053  HGBA1C 6.6*   CBG: No results for input(s): "GLUCAP" in the last 168 hours. Lipid Profile: No results for input(s): "CHOL", "HDL", "LDLCALC", "TRIG", "CHOLHDL", "LDLDIRECT" in the last 72 hours. Thyroid Function Tests: Recent Labs    07/20/23 1053  TSH 2.193   Anemia Panel: Recent Labs    07/20/23 1053  VITAMINB12 1,020*  FOLATE >40.0  FERRITIN 138  TIBC 354  IRON 50  RETICCTPCT 2.4   Urine analysis: No results found for: "COLORURINE", "APPEARANCEUR", "LABSPEC", "PHURINE", "GLUCOSEU", "HGBUR", "BILIRUBINUR", "KETONESUR", "PROTEINUR", "UROBILINOGEN", "NITRITE", "LEUKOCYTESUR" Sepsis Labs: @LABRCNTIP (procalcitonin:4,lacticidven:4)  ) Recent Results (from the past 240 hours)  MRSA Next Gen by PCR, Nasal     Status: None   Collection Time: 07/18/23  9:15 PM   Specimen: Nasal Mucosa; Nasal Swab  Result Value Ref Range Status   MRSA by PCR Next Gen NOT DETECTED NOT DETECTED Final    Comment: (NOTE) The GeneXpert MRSA Assay (FDA approved for NASAL specimens only), is one component of a comprehensive MRSA colonization surveillance program. It is not intended to diagnose MRSA infection nor to guide or monitor treatment for MRSA infections. Test performance is not FDA approved in patients less than 80 years old. Performed at [facility], [address] [place], [place] [address]      Radiology Studies: No results found.    Scheduled Meds:  [MAR Hold] aspirin  81 mg Oral Daily   [MAR Hold] Chlorhexidine Gluconate Cloth  6 each Topical Daily   [MAR Hold]  cyanocobalamin  1,000 mcg Oral Daily   [MAR Hold] dapagliflozin propanediol  10 mg Oral Daily   [MAR Hold] digoxin  0.125 mg Oral Daily   [MAR Hold] folic acid  1 mg Oral Daily   [MAR Hold] heparin  5,000 Units Subcutaneous Q8H   [MAR Hold] potassium chloride  40 mEq Oral Q4H   [MAR Hold] rosuvastatin  20 mg Oral Daily   [MAR Hold] sodium chloride flush  10-40 mL Intracatheter Q12H   [MAR Hold] sodium chloride flush  3 mL Intravenous Q12H   [MAR Hold] spironolactone  12.5 mg Oral Daily   Continuous Infusions:  milrinone 0.375 mcg/kg/min (07/21/23 2033)     LOS: 4 days    Time spent:    Zannie Cove, MD [facility]   07/22/2023, 11:30 AM

## 2023-07-22 NOTE — Interval H&P Note (Signed)
History and Physical Interval Note:  07/22/2023 10:54 AM  [name] Daniel  has presented today for surgery, with the diagnosis of heart failure.  The various methods of treatment have been discussed with the patient and family. After consideration of risks, benefits and other options for treatment, the patient has consented to  Procedure(s): RIGHT/LEFT HEART CATH AND CORONARY ANGIOGRAPHY (N/A) as a surgical intervention.  The patient's history has been reviewed, patient examined, no change in status, stable for surgery.  I have reviewed the patient's chart and labs.  Questions were answered to the patient's satisfaction.     Taunja Brickner [employer]

## 2023-07-22 NOTE — Progress Notes (Signed)
  Earlier taken off milrinone and switched to dobutamine 5 mcg + IABP 1:2   CVP 4 PA 28/16  CO 4.2 /CI 2.2   Improved hemodynamics. Continue current regimen.    Cylee Dattilo NP-C 4:44 PM
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
## 2023-07-22 NOTE — Progress Notes (Signed)
Patient ID: [name] Daniel, male   DOB: Jun 09, 1955, 68 y.o.   MRN: [id number]     Advanced Heart Failure Rounding Note  Cardiologist: Dina Rich, MD  Chief Complaint: CHF Subjective:    Great diuresis again yesterday, I/Os net negative 4574 cc on Lasix gtt 6 mg/hr.  CVP <5  this morning.  SBP 90s-100s.  Creatinine 1.66 => 1.46 => 1.55.  Co-ox 78% on milrinone 0.375.   Not very interactive.  Says his breathing is fine.    Objective:   Weight Range: 67.6 kg Body mass index is 19.66 kg/m.   Vital Signs:   Temp:  [98.5 F (36.9 C)-99.3 F (37.4 C)] 98.5 F (36.9 C) (02/17 0402) Pulse Rate:  [104-137] 112 (02/17 0415) Resp:  [16-18] 16 (02/17 0402) BP: (96-106)/(81-87) 96/81 (02/17 0402) SpO2:  [88 %-96 %] 93 % (02/17 0415) Weight:  [67.6 kg] 67.6 kg (02/17 0402)    Weight change: Filed Weights   07/20/23 0501 07/21/23 0427 07/22/23 0402  Weight: 73.8 kg 67.7 kg 67.6 kg    Intake/Output:   Intake/Output Summary (Last 24 hours) at 07/22/2023 0726 Last data filed at 07/22/2023 0600 Gross per 24 hour  Intake 1175.77 ml  Output 5750 ml  Net -4574.23 ml      Physical Exam    General: NAD, thin Neck: No JVD, no thyromegaly or thyroid nodule.  Lungs: Clear to auscultation bilaterally with normal respiratory effort. CV: Nondisplaced PMI.  Heart regular S1/S2, no S3/S4, 3/6 SEM RUSB.  No peripheral edema.    Abdomen: Soft, nontender, no hepatosplenomegaly, no distention.  Skin: Intact without lesions or rashes.  Neurologic: Drowsy, oriented.  Psych: Normal affect. Extremities: No clubbing or cyanosis.  HEENT: Normal.   Telemetry   NSR 100s (personally reviewed)  Labs    CBC Recent Labs    07/21/23 0558 07/22/23 0249  WBC 6.6 6.8  NEUTROABS 4.5 4.6  HGB 13.7 14.7  HCT 40.3 42.6  MCV 92.0 91.0  PLT 98* 106*   Basic Metabolic Panel Recent Labs    40/98/11 0558 07/21/23 1557 07/22/23 0249  NA 134* 131* 133*  K 3.2* 4.5 3.7  CL 85* 85* 83*  CO2  34* 35* 35*  GLUCOSE 149* 150* 122*  BUN 26* 29* 27*  CREATININE 1.46* 1.53* 1.55*  CALCIUM 9.0 8.9 9.0  MG 2.1  --   --    Liver Function Tests No results for input(s): "AST", "ALT", "ALKPHOS", "BILITOT", "PROT", "ALBUMIN" in the last 72 hours.  No results for input(s): "LIPASE", "AMYLASE" in the last 72 hours. Cardiac Enzymes No results for input(s): "CKTOTAL", "CKMB", "CKMBINDEX", "TROPONINI" in the last 72 hours.  BNP: BNP (last 3 results) Recent Labs    06/03/23 2152 07/18/23 1609  BNP 3,234.0* >4,500.0*    ProBNP (last 3 results) No results for input(s): "PROBNP" in the last 8760 hours.   D-Dimer No results for input(s): "DDIMER" in the last 72 hours. Hemoglobin A1C Recent Labs    07/20/23 1053  HGBA1C 6.6*   Fasting Lipid Panel No results for input(s): "CHOL", "HDL", "LDLCALC", "TRIG", "CHOLHDL", "LDLDIRECT" in the last 72 hours. Thyroid Function Tests Recent Labs    07/20/23 1053  TSH 2.193    Other results:   Imaging    No results found.   Medications:     Scheduled Medications:  aspirin  81 mg Oral Daily   Chlorhexidine Gluconate Cloth  6 each Topical Daily   cyanocobalamin  1,000 mcg Oral Daily  dapagliflozin propanediol  10 mg Oral Daily   digoxin  0.125 mg Oral Daily   folic acid  1 mg Oral Daily   heparin  5,000 Units Subcutaneous Q8H   potassium chloride  40 mEq Oral Q4H   rosuvastatin  20 mg Oral Daily   sodium chloride flush  10-40 mL Intracatheter Q12H   sodium chloride flush  3 mL Intravenous Q12H   spironolactone  12.5 mg Oral Daily    Infusions:  sodium chloride     milrinone 0.375 mcg/kg/min (07/21/23 2033)    PRN Medications: acetaminophen, ondansetron (ZOFRAN) IV, mouth rinse, sodium chloride flush, sodium chloride flush, traMADol, zolpidem   Assessment/Plan   1. Acute on chronic systolic CHF/cardiogenic shock: Echo in 12/24 showed EF 20%, mild RV dysfunction, severe low flow/low gradient aortic stenosis with  mean gradient 35 mmHg and AVA 0.49 cm^2. 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  He has severe aortic stenosis.  He also has anterior Qs on ECG so prior MI is certainly possible. He was re-admitted with marked volume overload and low cardiac output, co-ox 38%.  He was started on milrinone which was increased to 0.375 and has been diuresed with Lasix gtt 12 => 6 mg/hr.  Excellent diuresis again over the last day.  CVP now < 5. Co-ox 78% today.  - Continue milrinone 0.375 for now.  - Stop Lasix today, will give gentle pre-cath fluid 50 cc/hr x 3 hrs.   - Continue spironolactone 12.5 daily.  - Continue Farxiga 10 daily.  - Continue digoxin 0.125 daily.  - Will need RHC/LHC today.  May need IABP depending on CO.  I discussed risks/benefits of procedure with patient and he agreed to procedure.  - Unna boots 2. Elevated troponin: Mild elevation with no trend. Suspect demand ischemia with volume overload.  - Continue statin - Continue ASA 81 3. Aortic stenosis: Low flow/low gradient severe AS on 12/24 echo. This contributes to CHF.  He will ultimately need the aortic valve replaced.  4. Thrombocytopenia: Platelets have been mildly low since 12/24.  Follow.  5. Ascending aortic aneurysm: 5.0 cm on 12/24 CTA chest.  6. H/o ETOH abuse: Not very forthcoming on current drinking habits but apparently has quit for about 6 months.   Length of Stay: 4  Marca Ancona, MD  07/22/2023, 7:26 AM  Advanced Heart Failure Team Pager [phone] (M-F; 7a - 5p)  Please contact [facility] for night-coverage after hours (5p -7a ) and weekends on [email or web address]

## 2023-07-22 NOTE — TOC Initial Note (Signed)
Transition of Care [facility] [place]) - Initial/Assessment Note    Patient Details  Name: [name] Daniel MRN: [id number] Date of Birth: Oct 15, 1955  Transition of Care [facility]) CM/SW Contact:    Nicanor Bake Phone Number: [phone] 07/22/2023, 11:40 AM  Clinical Narrative:    11:33 AM- HF CSW attempted to meet with pt at bedside. Pt was out of the room for CATH. CSW will follow up with pt at a more appropriate time.   TOC will continue following.                Expected Discharge Plan: Home/Self Care Barriers to Discharge: Continued Medical Work up   Patient Goals and CMS Choice Patient states their goals for this hospitalization and ongoing recovery are:: return home   Choice offered to / list presented to : Patient [place] ownership interest in [facility].provided to::  (n/a)    Expected Discharge Plan and Services In-house Referral: Clinical Social Work     Living arrangements for the past 2 months: Single Family Home                                      Prior Living Arrangements/Services Living arrangements for the past 2 months: Single Family Home Lives with:: Self Patient language and need for interpreter reviewed:: Yes Do you feel safe going back to the place where you live?: Yes      Need for Family Participation in Patient Care: No (Comment)     Criminal Activity/Legal Involvement Pertinent to Current Situation/Hospitalization: No - Comment as needed  Activities of Daily Living   ADL Screening (condition at time of admission) Independently performs ADLs?: Yes (appropriate for developmental age) Is the patient deaf or have difficulty hearing?: No Does the patient have difficulty seeing, even when wearing glasses/contacts?: No Does the patient have difficulty concentrating, remembering, or making decisions?: No  Permission Sought/Granted                  Emotional Assessment     Affect (typically observed):  Appropriate Orientation: : Oriented to Place, Oriented to  Time, Oriented to Situation, Oriented to Self Alcohol / Substance Use: Not Applicable Psych Involvement: No (comment)  Admission diagnosis:  Acute on chronic systolic CHF (congestive heart failure) (HCC) [I50.23] Acute on chronic congestive heart failure, unspecified heart failure type (HCC) [I50.9] Patient Active Problem List   Diagnosis Date Noted   Scrotal edema 07/18/2023   Peripheral edema 07/18/2023   Acute on chronic systolic CHF (congestive heart failure) (HCC) 07/18/2023   B12 deficiency 06/05/2023   Folate deficiency 06/05/2023   Acute HFrEF (heart failure with reduced ejection fraction) (HCC) 06/05/2023   Severe aortic stenosis 06/05/2023   Cardiomyopathy (HCC) 06/05/2023   Stage 3a chronic kidney disease (CKD) (HCC) 06/05/2023   Elevated d-dimer 06/05/2023   Thrombocytopenia (HCC) 06/04/2023   Thoracic aortic aneurysm (HCC) 06/04/2023   Incarcerated hernia 05/09/2023   Intestinal obstruction (HCC) 05/09/2023   Upper respiratory infection 05/08/2023   PCP:  Patient, No Pcp Per Pharmacy:   [id number] - [place], [place] - [address] [place] [address] Phone: [phone] Fax: [phone]  [facility] - [place], [place] - [address] [place] [address] Phone: [phone] Fax: [phone]  [facility] [place], [place] - [address], [place] [address] [place] [address] Phone: [phone] Fax: [phone]     Social Drivers of Health (SDOH) Social History: SDOH Screenings   Food Insecurity: No Food Insecurity (07/18/2023)  Housing: Low Risk  (07/18/2023)  Transportation Needs: No Transportation Needs (07/18/2023)  Utilities: Not At Risk (07/18/2023)  Social Connections: Socially Isolated (07/18/2023)  Tobacco Use: Medium Risk (07/18/2023)   SDOH Interventions:     Readmission Risk  Interventions    05/09/2023   12:49 PM  Readmission Risk Prevention Plan  Post Dischage Appt Not Complete  Medication Screening Complete  Transportation Screening Complete

## 2023-07-22 NOTE — Progress Notes (Signed)
   Cath   --On Milrinone 0.375 mcg.   RHC/LHC - RA mean 5 RV 28/6 PA 30/12, mean 22 PCWP mean 11 AO 96/82 Oxygen saturations: PA 59% AO 92% Cardiac Output (Thermo) 3.27  Cardiac Index (Thermo) 1.72 PVR 3.4 WU Cardiac Output (Fick) 3.8  Cardiac Index (Fick) 2.0 PVR 2.9 WU PAPi 3.6  Cath- Mid Cx 40% stenosed 1st obtuse lesion 50% stenosed.   IABP placed   CVP  2-3  CI 1.6  Stop milrinone and start Dobutamine 5 mcg. Repeat swan numbers after 1 hour.   Sabriya Yono NP-C  2:04 PM
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
## 2023-07-23 ENCOUNTER — Inpatient Hospital Stay (HOSPITAL_COMMUNITY): Payer: Medicare Other

## 2023-07-23 DIAGNOSIS — I350 Nonrheumatic aortic (valve) stenosis: Secondary | ICD-10-CM

## 2023-07-23 LAB — CBC WITH DIFFERENTIAL/PLATELET
Abs Immature Granulocytes: 0.01 10*3/uL (ref 0.00–0.07)
Basophils Absolute: 0 10*3/uL (ref 0.0–0.1)
Basophils Relative: 1 %
Eosinophils Absolute: 0 10*3/uL (ref 0.0–0.5)
Eosinophils Relative: 1 %
HCT: 39.7 % (ref 39.0–52.0)
Hemoglobin: 13.3 g/dL (ref 13.0–17.0)
Immature Granulocytes: 0 %
Lymphocytes Relative: 21 %
Lymphs Abs: 1.2 10*3/uL (ref 0.7–4.0)
MCH: 31 pg (ref 26.0–34.0)
MCHC: 33.5 g/dL (ref 30.0–36.0)
MCV: 92.5 fL (ref 80.0–100.0)
Monocytes Absolute: 0.5 10*3/uL (ref 0.1–1.0)
Monocytes Relative: 9 %
Neutro Abs: 3.9 10*3/uL (ref 1.7–7.7)
Neutrophils Relative %: 68 %
Platelets: 84 10*3/uL — ABNORMAL LOW (ref 150–400)
RBC: 4.29 MIL/uL (ref 4.22–5.81)
RDW: 16.6 % — ABNORMAL HIGH (ref 11.5–15.5)
WBC: 5.7 10*3/uL (ref 4.0–10.5)
nRBC: 0 % (ref 0.0–0.2)

## 2023-07-23 LAB — BASIC METABOLIC PANEL
Anion gap: 12 (ref 5–15)
BUN: 27 mg/dL — ABNORMAL HIGH (ref 8–23)
CO2: 34 mmol/L — ABNORMAL HIGH (ref 22–32)
Calcium: 8.8 mg/dL — ABNORMAL LOW (ref 8.9–10.3)
Chloride: 87 mmol/L — ABNORMAL LOW (ref 98–111)
Creatinine, Ser: 1.41 mg/dL — ABNORMAL HIGH (ref 0.61–1.24)
GFR, Estimated: 55 mL/min — ABNORMAL LOW (ref >60)
Glucose, Bld: 105 mg/dL — ABNORMAL HIGH (ref 70–99)
Potassium: 3.7 mmol/L (ref 3.5–5.1)
Sodium: 133 mmol/L — ABNORMAL LOW (ref 135–145)

## 2023-07-23 LAB — COOXEMETRY PANEL
Carboxyhemoglobin: 1.4 % (ref 0.5–1.5)
Methemoglobin: 0.7 % (ref 0.0–1.5)
O2 Saturation: 65.5 %
Total hemoglobin: 13.8 g/dL (ref 12.0–16.0)

## 2023-07-23 LAB — HEPARIN LEVEL (UNFRACTIONATED): Heparin Unfractionated: 0.32 [IU]/mL (ref 0.30–0.70)

## 2023-07-23 LAB — PHOSPHORUS: Phosphorus: 4.1 mg/dL (ref 2.5–4.6)

## 2023-07-23 LAB — MAGNESIUM: Magnesium: 2.3 mg/dL (ref 1.7–2.4)

## 2023-07-23 MED ORDER — SPIRONOLACTONE 25 MG PO TABS
25.0000 mg | ORAL_TABLET | Freq: Every day | ORAL | Status: DC
Start: 2023-07-23 — End: 2023-07-29
  Administered 2023-07-23 – 2023-07-28 (×6): 25 mg via ORAL
  Filled 2023-07-23 (×7): qty 1

## 2023-07-23 MED ORDER — THIAMINE MONONITRATE 100 MG PO TABS
100.0000 mg | ORAL_TABLET | Freq: Every day | ORAL | Status: DC
  Administered 2023-07-23 – 2023-08-01 (×10): 100 mg via ORAL
  Filled 2023-07-23 (×10): qty 1

## 2023-07-23 MED ORDER — ENSURE ENLIVE PO LIQD
237.0000 mL | Freq: Three times a day (TID) | ORAL | Status: DC
  Administered 2023-07-23 – 2023-08-01 (×20): 237 mL via ORAL

## 2023-07-23 MED ORDER — MELATONIN 5 MG PO TABS
5.0000 mg | ORAL_TABLET | Freq: Once | ORAL | Status: AC
  Administered 2023-07-23: 5 mg via ORAL
  Filled 2023-07-23: qty 1

## 2023-07-23 MED ORDER — ASPIRIN 81 MG PO TBEC
81.0000 mg | DELAYED_RELEASE_TABLET | Freq: Every day | ORAL | Status: DC
  Administered 2023-07-23 – 2023-08-01 (×10): 81 mg via ORAL
  Filled 2023-07-23 (×10): qty 1

## 2023-07-23 MED ORDER — ADULT MULTIVITAMIN W/MINERALS CH
1.0000 | ORAL_TABLET | Freq: Every day | ORAL | Status: DC
  Administered 2023-07-23 – 2023-08-01 (×10): 1 via ORAL
  Filled 2023-07-23 (×11): qty 1

## 2023-07-23 MED ORDER — OXYCODONE HCL 5 MG PO TABS
5.0000 mg | ORAL_TABLET | Freq: Four times a day (QID) | ORAL | Status: DC | PRN
  Administered 2023-07-23 – 2023-07-31 (×17): 5 mg via ORAL
  Filled 2023-07-23 (×17): qty 1

## 2023-07-23 MED ORDER — POTASSIUM CHLORIDE CRYS ER 20 MEQ PO TBCR
40.0000 meq | EXTENDED_RELEASE_TABLET | Freq: Once | ORAL | Status: AC
  Administered 2023-07-23: 40 meq via ORAL
  Filled 2023-07-23: qty 2

## 2023-07-23 NOTE — Consult Note (Addendum)
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 team.   For questions or updates, please contact [facility] Please consult [email or web address] for contact info under    Signed, Cline Crock, PA-C  07/23/2023 2:10 PM  Patient seen, examined. Available data reviewed. Agree with findings, assessment, and plan as outlined by Carlean Jews, PA-C.  The patient is independently interviewed and examined.  He is a thin appearing age-appropriate male in no acute distress.  There is an intra-aortic balloon pump in place, currently augmenting 1-2.  HEENT is notable for very poor dentition with 3 remaining upper teeth.  JVP is normal.  Carotid upstrokes are normal without bruits.  Lungs are clear bilaterally.  The balloon pump is paused for cardiac exam which reveals a displaced and prominent PMI.  The  heart is tachycardic  and regular with an S3 gallop and a 2/6 harsh crescendo decrescendo murmur heard both at the apex and the right upper sternal border with no blunting of A2.  The abdomen is soft with no masses and only mild diffuse tenderness but no rebound or guarding.  Lower extremities have no edema.  Skin is warm and dry with no rash.  Intra-aortic balloon site is clear with no hematoma.  The patient's cardiac catheterization images are personally reviewed and demonstrate patent coronary arteries with mild nonobstructive coronary plaquing.  The aortic valve is severely calcified and restricted on plain fluoroscopy.  The patient's echo shows very severe LV dysfunction with LVEF estimated at 20% in a pattern of global hypokinesis with mild LVH.  RV function is reduced but the RV is not significantly dilated.  There is a small pericardial effusion present.  There is mild mitral regurgitation without mitral stenosis.  The aortic valve is severely calcified and restricted.  The mean transvalvular gradient is 29 mmHg with a dimensionless index of 0.16 and calculated aortic valve area of 0.49 cm.  Stroke-volume index is severely reduced at 13.  History is extremely limited as the patient has a flat affect and he is not conversant.  It is very difficult to understand how much treatment he actually wants to pursue here.  He is currently in persistent cardiogenic shock requiring IV dobutamine infusion and use of an intra-aortic balloon pump.  His clinical situation is complicated by the presence of severe low-flow low gradient aortic stenosis (stage D2).  The patient severe LV dysfunction could be related to heavy alcohol use and also could be related to his severe aortic stenosis.  He clearly does not have an ischemic cardiomyopathy as there is no obstructive CAD present at cardiac catheterization.  The benefit of aortic valve intervention is somewhat uncertain, but without it, I suspect the patient would not be  able to survive weaning of inotropic therapy and would clearly require a palliative approach to his care.  I do not think he would be a candidate for conventional aortic valve surgery with aortic aneurysm repair under any circumstances in the setting of his advanced shock and comorbid conditions.  TAVR would be a reasonable treatment consideration if his anatomy lends itself to transfemoral TAVR.  If we are going to evaluate him further, I would recommend weaning him from the IABP as tolerated, then once the balloon pump is out moving forward with a gated CTA of the heart and a CTA of the chest, abdomen, and pelvis.  Once his CTA studies are completed, formal cardiac surgical consultation would be obtained and if felt to be an appropriate candidate by the multidisciplinary heart team, we would proceed with TAVR which would likely need to be performed as an inpatient considering his inotrope dependence at present.  Will discuss his case with the advanced heart failure service as well.  At this point I think an attempt should be made to wean him from the intra-aortic balloon pump, continue other therapies, and we will order CTA studies as above.  Is difficult to know how much LV recovery he would have with treatment of his aortic stenosis, but the fact that his left ventricle is not significantly dilated and then he is still generating a transvalvular gradient of almost 30 mmHg are probably good signs that he would have significant recovery.  Will follow with you.  Tonny Bollman, M.D. 07/23/2023 5:23 PM

## 2023-07-23 NOTE — Progress Notes (Addendum)
Patient ID: [name] Daniel, male   DOB: 1956-04-21, 68 y.o.   MRN: [id number]     Advanced Heart Failure Rounding Note  Cardiologist: Dina Rich, MD  Chief Complaint: Cardiogenic Shock. Subjective:    2/17 Transferred ICU from cath lab. LHC nonobstructive CAD. IABP placed and continued on milrinone. Due to low CO/CI switched to DBA. CT Surgery/Structural Team consulted for aortic stenosis.  On DBA 5 mcg. CVP 2  PA 27/15 (19) PAPi 6  CO 5.3 CI 2.8  Denies SOB. Denies chest pain.   Objective:   Weight Range: 64.5 kg Body mass index is 18.76 kg/m.   Vital Signs:   Temp:  [97 F (36.1 C)-99.1 F (37.3 C)] 97.2 F (36.2 C) (02/18 0700) Pulse Rate:  [0-215] 165 (02/18 0700) Resp:  [11-33] 22 (02/18 0700) BP: (82-142)/(60-130) 133/88 (02/18 0700) SpO2:  [82 %-100 %] 98 % (02/18 0700) Weight:  [64.5 kg] 64.5 kg (02/18 0530) Last BM Date : 07/21/23  Weight change: Filed Weights   07/21/23 0427 07/22/23 0402 07/23/23 0530  Weight: 67.7 kg 67.6 kg 64.5 kg    Intake/Output:   Intake/Output Summary (Last 24 hours) at 07/23/2023 0717 Last data filed at 07/23/2023 0600 Gross per 24 hour  Intake 1020.89 ml  Output 1050 ml  Net -29.11 ml      Physical Exam  General:  Thin.  No resp difficulty Cor: PMI nondisplaced. Regular rate & rhythm. No rubs, gallops . 2/6 AS murmurs. Lungs: clear Abdomen: soft, nontender, nondistended. Extremities: no cyanosis, clubbing, rash, edema.R Femoral IABP . RLE immobilizer. Neuro: alert & oriented x3 Telemetry  SR 90s   Labs    CBC Recent Labs    07/22/23 0249 07/22/23 1119 07/22/23 1120 07/23/23 0411  WBC 6.8  --   --  5.7  NEUTROABS 4.6  --   --  3.9  HGB 14.7   < > 16.0 13.3  HCT 42.6   < > 47.0 39.7  MCV 91.0  --   --  92.5  PLT 106*  --   --  84*   < > = values in this interval not displayed.   Basic Metabolic Panel Recent Labs    08/65/78 0249 07/22/23 1119 07/22/23 1120 07/23/23 0411  NA 133*   < > 132*  133*  K 3.7   < > 3.8 3.7  CL 83*  --   --  87*  CO2 35*  --   --  34*  GLUCOSE 122*  --   --  105*  BUN 27*  --   --  27*  CREATININE 1.55*  --   --  1.41*  CALCIUM 9.0  --   --  8.8*  MG 2.3  --   --  2.3   < > = values in this interval not displayed.   Liver Function Tests No results for input(s): "AST", "ALT", "ALKPHOS", "BILITOT", "PROT", "ALBUMIN" in the last 72 hours.  No results for input(s): "LIPASE", "AMYLASE" in the last 72 hours. Cardiac Enzymes No results for input(s): "CKTOTAL", "CKMB", "CKMBINDEX", "TROPONINI" in the last 72 hours.  BNP: BNP (last 3 results) Recent Labs    06/03/23 2152 07/18/23 1609  BNP 3,234.0* >4,500.0*    ProBNP (last 3 results) No results for input(s): "PROBNP" in the last 8760 hours.   D-Dimer No results for input(s): "DDIMER" in the last 72 hours. Hemoglobin A1C Recent Labs    07/20/23 1053  HGBA1C 6.6*   Fasting Lipid Panel  No results for input(s): "CHOL", "HDL", "LDLCALC", "TRIG", "CHOLHDL", "LDLDIRECT" in the last 72 hours. Thyroid Function Tests Recent Labs    07/20/23 1053  TSH 2.193    Other results:   Imaging    CARDIAC CATHETERIZATION Result Date: 07/22/2023 1. Nonobstructive CAD.  Aneurysmal ascending aorta/root makes coronary engagement difficult. 2. Normal filling pressures after diuresis.  3. Low cardiac output by Fick, markedly low by thermodilution. 4. Aortic valve not crossed, known severe AS by echo. 5. IABP placed with low cardiac output on milrinone 0.375. Swan and IABP left in place, transfer to 2H and evaluate for AVR + ascending aorta replacement (high risk).     Medications:     Scheduled Medications:  aspirin  81 mg Oral Daily   Chlorhexidine Gluconate Cloth  6 each Topical Daily   cyanocobalamin  1,000 mcg Oral Daily   digoxin  0.125 mg Oral Daily   folic acid  1 mg Oral Daily   rosuvastatin  20 mg Oral Daily   sodium chloride flush  10-40 mL Intracatheter Q12H   sodium chloride flush   3 mL Intravenous Q12H   spironolactone  12.5 mg Oral Daily    Infusions:  sodium chloride 10 mL/hr at 07/23/23 0600   DOBUTamine 5 mcg/kg/min (07/23/23 0600)   heparin 550 Units/hr (07/23/23 0600)    PRN Medications: sodium chloride, acetaminophen, ondansetron (ZOFRAN) IV, mouth rinse, sodium chloride flush, sodium chloride flush, traMADol, zolpidem   Assessment/Plan   1. Acute on chronic systolic CHF/cardiogenic shock: Echo in 12/24 showed EF 20%, mild RV dysfunction, severe low flow/low gradient aortic stenosis with mean gradient 35 mmHg and AVA 0.49 cm^2. 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  He has severe aortic stenosis.  He also has anterior Qs on ECG so prior MI is certainly possible. He was re-admitted with marked volume overload and low cardiac output, co-ox 38%.  He was started on milrinone which was increased to 0.375 and diuresed with lasix drip. Yesterday he had cath - IABP placed and continued on Milrinone. Due to low CO/CI he was switched to Dobutamine 5 mcg. Hemodynamics  improved. Euvolemic,CO 5.3 CI 2.8 . Continue Dobutamine 5 +IABP 1:2..   - Increase spiro 25 mg daily.  - Continue digoxin 0.125 daily.  2. Elevated troponin: Mild elevation with no trend. Suspect demand ischemia with volume overload.  -LHC with nonobstructive CAD>  - Continue statin - Continue ASA 81 3. Aortic stenosis: Low flow/low gradient severe AS on 12/24 echo. This contributes to CHF.  He will ultimately need the aortic valve replaced.  -CT Surgery/Structural Team consulted. 4. Thrombocytopenia: Platelets have been mildly low since 12/24.  Follow.  5. Ascending aortic aneurysm: 5.0 cm on 12/24 CTA chest.  6. H/o ETOH abuse: Not very forthcoming on current drinking habits but apparently has quit for about 6 months.   SDOH- Difficult obtaining information from him. There is some question that he is homeless. His sister is his point of contact.but he did not want [place] to reach out. Consult TOC.    CCT 15 mins  Length of Stay: 5  Amy Clegg, NP  07/23/2023, 7:17 AM  Advanced Heart Failure Team Pager [phone] (M-F; 7a - 5p)  Please contact [facility] for night-coverage after hours (5p -7a ) and weekends on [email or web address]   Agree with above.   Remains on IABP at 1:2 and DBA 5    Denies CP or SOB.   Hemodynamics much improved with adjustments in drips and IABP  PAP: (  15-49)/(8-35) 37/22 CVP:  [0 mmHg-11 mmHg] 2 mmHg CO:  [3.1 L/min-5.8 L/min] 5.3 L/min CI:  [1.6 L/min/m2-3.05 L/min/m2] 2.78 L/min/m2  He is not sure if he wants to go through any more procedures   General:  Elderly thin No resp difficulty HEENT: normal Neck: supple.RIJ swan Carotids 2+ bilat; no bruits. No lymphadenopathy or thryomegaly appreciated. Cor: PMI nondisplaced. Tachy regular 3/6 AS No S2  Lungs: clear Abdomen: soft, nontender, nondistended. No hepatosplenomegaly. No bruits or masses. Good bowel sounds. Extremities: no cyanosis, clubbing, rash, edema  RFA IABP  Neuro: alert & orientedx3, cranial nerves grossly intact. moves all 4 extremities w/o difficulty. Affect flat   Case d/w TCTS and IC team Not candidate for surgery. They are discussing options of high-risk TAVR vs Palliative Care  [name] Daniel is not sure that he wants to proceed with any more procedures. He told me "I will think about it"   I asked if I could call his sister and let her know of the situation. He said she was under a lot of stress and he didn't want to bother her. He finally agreed to let me call her. And update her.   Continue heparin for IABP. Dosing d/w PharmD personally.   Will continue current support with multi-discpilnary decision making process.   CRITICAL CARE Performed by: Arvilla Meres  Total critical care time: 40 minutes  Critical care time was exclusive of separately billable procedures and treating other patients.  Critical care was necessary to treat or prevent imminent or life-threatening  deterioration.  Critical care was time spent personally by me (independent of midlevel providers or residents) on the following activities: development of treatment plan with patient and/or surrogate as well as nursing, discussions with consultants, evaluation of patient's response to treatment, examination of patient, obtaining history from patient or surrogate, ordering and performing treatments and interventions, ordering and review of laboratory studies, ordering and review of radiographic studies, pulse oximetry and re-evaluation of patient's condition.  Arvilla Meres, MD  10:43 AM    Remains on

## 2023-07-23 NOTE — Progress Notes (Signed)
Femstop applied to right femoral IABP access site on 07/23/23 at 1215 per verbal order by Lorenda Cahill. Instructed to leave device in place for 1 hour and then remove. Femstop removed at 1315, site soft with no bleeding noted. No issues with IABP, vital signs stable.  Clif Serio Berneta Levins, RN

## 2023-07-23 NOTE — Progress Notes (Addendum)
PROGRESS NOTE    [name] Daniel  WUJ:[id number] DOB: 22-Aug-1955 DOA: 07/18/2023 PCP: Patient, No Pcp Per  67/M with recently diagnosed systolic CHF, at [facility] discharged on 1/3, refused cardiac cath then, prior to that hospitalized in December for incarcerated hernia, underwent mesh hernia repair. -Back in the ED 2/13 with shortness of breath lower extremity edema and volume overload, noted to have 3+ edema, scrotal edema, BNP> 4500, creatinine 1.5, troponin 68, chest x-ray with pleural effusion and volume overload, TX to [facility] for Cards eval  -2/15 started on milrinone with IV Lasix> brisk diuresis -2/17 RHC/LHC. Nonobstructive CAD.  Aneurysmal ascending aorta/root makes coronary engagement difficult.  Normal filling pressures after diuresis. Low cardiac output by Fick, Aortic valve not crossed, known severe AS by echo. IABP placed with low cardiac output on milrinone 0.375.  -Milrinone changed to dobutamine -T CTS and structural heart team consulted  Subjective: -Transferred to 2H yesterday afternoon after balloon pump and right heart cath, he denies any symptoms this morning  Assessment and Plan:  Acute on chronic systolic CHF, NICM Cardiogenic shock -Recent echo 12/24 with EF 20%, mildly reduced RV, severe low-flow low gradient aortic stenosis, mild to moderate pericardial effusion -CHF team following, he has diuresed well, 13.9 L negative, weight down 30 LB -2/17 -RHC/LHC with nonobstructive CAD, normal filling pressures, low cardiac output, severe aortic stenosis> balloon pump placed -Now on dobutamine -Does not need further diuretics, continue digoxin -T CTS/structural heart team consulting for TAVR eval  Severe AS with cardiogenic shock -Plan for TAVR eval  Severe hypokalemia Replaced  Hyperglycemia, new diabetes mellitus -A1c is 6.6, now on Farxiga   Mild thrombocytopenia -Platelet count lower on IV heparin, trend  Cognitive deficits -Could be EtOH related -TSH is normal,  follow-up RPR  History of EtOH use -Reportedly quit 6 months ago -Continue thiamine, multivitamin   Ascending aortic aneurysm -Incidental finding on CTA chest recent admission -outpatient surveillance   DVT prophylaxis: Heparin gtt Code Status: Full code Family Communication: No family at bedside Disposition Plan: Home pending above workup  Consultants: Cards   Procedures:   Antimicrobials:    Objective: Vitals:   07/23/23 0915 07/23/23 0930 07/23/23 0933 07/23/23 0945  BP:  (!) 108/91    Pulse: (!) 212 (!) 188 (!) 206 (!) 163  Resp: 17 18 (!) 31 15  Temp: 98.1 F (36.7 C) 98.1 F (36.7 C) 98.2 F (36.8 C) 97.9 F (36.6 C)  TempSrc:      SpO2: 95% 94% 95% 97%  Weight:      Height:        Intake/Output Summary (Last 24 hours) at 07/23/2023 0948 Last data filed at 07/23/2023 0900 Gross per 24 hour  Intake 1082.6 ml  Output 1050 ml  Net 32.6 ml   Filed Weights   07/21/23 0427 07/22/23 0402 07/23/23 0530  Weight: 67.7 kg 67.6 kg 64.5 kg    Examination:  General exam: Chronically ill male laying in bed, AAO x 2, cognitive deficits HEENT: No JVD, Swan-Ganz catheter CVS: S1-S2, regular rhythm, systolic murmur Lungs: Poor air movement bilaterally otherwise clear Abdomen: Soft, nontender, bowel sounds present Extremities: Right groin balloon pump with immobilizer, no edema Skin: No rashes Psychiatry: Poor insight    Data Reviewed:   CBC: Recent Labs  Lab 07/18/23 1609 07/19/23 0525 07/20/23 0500 07/21/23 0558 07/22/23 0249 07/22/23 1119 07/22/23 1120 07/23/23 0411  WBC 5.8 6.6 7.1 6.6 6.8  --   --  5.7  NEUTROABS 3.8  --  4.9 4.5 4.6  --   --  3.9  HGB 14.0 13.6 13.7 13.7 14.7 16.0 16.0 13.3  HCT 41.8 41.8 39.9 40.3 42.6 47.0 47.0 39.7  MCV 95.4 94.8 91.9 92.0 91.0  --   --  92.5  PLT 120* 124* 97* 98* 106*  --   --  84*   Basic Metabolic Panel: Recent Labs  Lab 07/20/23 2100 07/21/23 0558 07/21/23 1557 07/22/23 0249 07/22/23 1119  07/22/23 1120 07/23/23 0411  NA 132* 134* 131* 133* 133* 132* 133*  K 3.9 3.2* 4.5 3.7 3.8 3.8 3.7  CL 89* 85* 85* 83*  --   --  87*  CO2 30 34* 35* 35*  --   --  34*  GLUCOSE 157* 149* 150* 122*  --   --  105*  BUN 33* 26* 29* 27*  --   --  27*  CREATININE 1.66* 1.46* 1.53* 1.55*  --   --  1.41*  CALCIUM 8.6* 9.0 8.9 9.0  --   --  8.8*  MG  --  2.1  --  2.3  --   --  2.3   GFR: Estimated Creatinine Clearance: 46.4 mL/min (A) (by C-G formula based on SCr of 1.41 mg/dL (H)). Liver Function Tests: Recent Labs  Lab 07/18/23 1609  AST 52*  ALT 43  ALKPHOS 63  BILITOT 2.3*  PROT 6.8  ALBUMIN 4.1   No results for input(s): "LIPASE", "AMYLASE" in the last 168 hours. No results for input(s): "AMMONIA" in the last 168 hours. Coagulation Profile: No results for input(s): "INR", "PROTIME" in the last 168 hours. Cardiac Enzymes: No results for input(s): "CKTOTAL", "CKMB", "CKMBINDEX", "TROPONINI" in the last 168 hours. BNP (last 3 results) No results for input(s): "PROBNP" in the last 8760 hours. HbA1C: Recent Labs    07/20/23 1053  HGBA1C 6.6*   CBG: No results for input(s): "GLUCAP" in the last 168 hours. Lipid Profile: No results for input(s): "CHOL", "HDL", "LDLCALC", "TRIG", "CHOLHDL", "LDLDIRECT" in the last 72 hours. Thyroid Function Tests: Recent Labs    07/20/23 1053  TSH 2.193   Anemia Panel: Recent Labs    07/20/23 1053  VITAMINB12 1,020*  FOLATE >40.0  FERRITIN 138  TIBC 354  IRON 50  RETICCTPCT 2.4   Urine analysis: No results found for: "COLORURINE", "APPEARANCEUR", "LABSPEC", "PHURINE", "GLUCOSEU", "HGBUR", "BILIRUBINUR", "KETONESUR", "PROTEINUR", "UROBILINOGEN", "NITRITE", "LEUKOCYTESUR" Sepsis Labs: @LABRCNTIP (procalcitonin:4,lacticidven:4)  ) Recent Results (from the past 240 hours)  MRSA Next Gen by PCR, Nasal     Status: None   Collection Time: 07/18/23  9:15 PM   Specimen: Nasal Mucosa; Nasal Swab  Result Value Ref Range Status   MRSA  by PCR Next Gen NOT DETECTED NOT DETECTED Final    Comment: (NOTE) The GeneXpert MRSA Assay (FDA approved for NASAL specimens only), is one component of a comprehensive MRSA colonization surveillance program. It is not intended to diagnose MRSA infection nor to guide or monitor treatment for MRSA infections. Test performance is not FDA approved in patients less than 61 years old. Performed at [facility], [address] [place], [place] [address]      Radiology Studies: CARDIAC CATHETERIZATION Result Date: 07/22/2023 1. Nonobstructive CAD.  Aneurysmal ascending aorta/root makes coronary engagement difficult. 2. Normal filling pressures after diuresis.  3. Low cardiac output by Fick, markedly low by thermodilution. 4. Aortic valve not crossed, known severe AS by echo. 5. IABP placed with low cardiac output on milrinone 0.375. Swan and IABP left in place,  transfer to 2H and evaluate for AVR + ascending aorta replacement (high risk).      Scheduled Meds:  aspirin EC  81 mg Oral Daily   Chlorhexidine Gluconate Cloth  6 each Topical Daily   cyanocobalamin  1,000 mcg Oral Daily   digoxin  0.125 mg Oral Daily   folic acid  1 mg Oral Daily   rosuvastatin  20 mg Oral Daily   sodium chloride flush  10-40 mL Intracatheter Q12H   sodium chloride flush  3 mL Intravenous Q12H   spironolactone  25 mg Oral Daily   Continuous Infusions:  sodium chloride 10 mL/hr at 07/23/23 0900   DOBUTamine 5 mcg/kg/min (07/23/23 0900)   heparin 550 Units/hr (07/23/23 0900)     LOS: 5 days    Time spent:    Zannie Cove, MD [facility]   07/23/2023, 9:48 AM

## 2023-07-23 NOTE — Progress Notes (Signed)
Initial Nutrition Assessment  DOCUMENTATION CODES:   Severe malnutrition in context of chronic illness  INTERVENTION:   Continue 2g sodium diet for now. If po intake not adequate on follow-up, recommend liberalizing to Regular  Ensure Enlive po TID between meals, each supplement provides 350 kcal and 20 grams of protein.  Start MVI with Minerals Start Thiamine 100 mg daily Continue Folic Acid, Thiamine   Recommend checking phosphorus and supplementing if low with continued monitoring of electrolytes  NUTRITION DIAGNOSIS:   Severe Malnutrition related to chronic illness as evidenced by severe fat depletion, severe muscle depletion.  GOAL:   Patient will meet greater than or equal to 90% of their needs   MONITOR:   PO intake, Supplement acceptance, Labs, Weight trends, Skin  REASON FOR ASSESSMENT:   Consult, Rounds Assessment of nutrition requirement/status  ASSESSMENT:   68 yo male admitted with acute on chronic heart failure with cardiogenic shock and severe aortic stenosis; required IABP with plans for TAVR eval. PMH includes CHF, CKD, hx of folate and B12 deficiency, hx of EtOH  2/13 Admitted 2/17 Tx to ICU, IABP placed, started dobutamine, CT surgery consult for aortic stenosis  IABP 1:2 and dobutamine at 5  Pt with very flat affect, does not really open eyes but does answer questions but does not provide much information. Pt reports appetite is ok now, ate good lunch today. When RD asked about appetite prior to admission, pt stated he "guesses it was alright."   Recorded po intake 15-100%. Pt has not been drinking oral nutrition supplements prior to admission but agreeable to Ensure/Boost  Pt with poor dentition but reports no problems chewing.   Noted pt with hx of significant EtOH use; reports he has not been drinking for months. Noted some question that patient may be homeless.    Weight down to 64.5 kg. Admit weight 78.3 kg. Per weight encounters, pt with  wt loss trend. Pt acknowledges he has lost weight but does not know how much,.   Labs: sodium 133 (L), BUN 27, Creatinine 1.41 Meds: B12, folic acid   NUTRITION - FOCUSED PHYSICAL EXAM:  Flowsheet Row Most Recent Value  Orbital Region Severe depletion  Upper Arm Region Severe depletion  Thoracic and Lumbar Region Severe depletion  Buccal Region Severe depletion  Temple Region Severe depletion  Clavicle Bone Region Severe depletion  Clavicle and Acromion Bone Region Severe depletion  Scapular Bone Region Severe depletion  Dorsal Hand Severe depletion  Patellar Region Severe depletion  Anterior Thigh Region Severe depletion  Posterior Calf Region Severe depletion  Edema (RD Assessment) Mild       Diet Order:   Diet Order             Diet 2 gram sodium Fluid consistency: Thin  Diet effective now                   EDUCATION NEEDS:   Education needs have been addressed  Skin:  Skin Assessment: Reviewed RN Assessment  Last BM:  2/16  Height:   Ht Readings from Last 1 Encounters:  07/23/23 6\' 1"  (1.854 m)    Weight:   Wt Readings from Last 1 Encounters:  07/23/23 64.5 kg     BMI:  Body mass index is 18.76 kg/m.  Estimated Nutritional Needs:   Kcal:  2100-2300 kcals  Protein:  105-125 g  Fluid:  1.8 L   Romelle Starcher MS, RDN, LDN, CNSC Registered Dietitian 3 Clinical Nutrition RD Inpatient Contact  Info in [employer]

## 2023-07-23 NOTE — Plan of Care (Signed)
  Problem: Activity: Goal: Risk for activity intolerance will decrease Outcome: Progressing   Problem: Nutrition: Goal: Adequate nutrition will be maintained Outcome: Progressing   Problem: Pain Managment: Goal: General experience of comfort will improve and/or be controlled Outcome: Progressing   Problem: Safety: Goal: Ability to remain free from injury will improve Outcome: Progressing   Problem: Skin Integrity: Goal: Risk for impaired skin integrity will decrease Outcome: Progressing   Problem: Cardiovascular: Goal: Ability to achieve and maintain adequate cardiovascular perfusion will improve Outcome: Progressing Goal: Vascular access site(s) Level 0-1 will be maintained Outcome: Progressing   Problem: Coping: Goal: Level of anxiety will decrease Outcome: Not Progressing   Problem: Activity: Goal: Ability to return to baseline activity level will improve Outcome: Not Progressing
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
## 2023-07-24 DIAGNOSIS — E119 Type 2 diabetes mellitus without complications: Secondary | ICD-10-CM

## 2023-07-24 DIAGNOSIS — F1011 Alcohol abuse, in remission: Secondary | ICD-10-CM

## 2023-07-24 DIAGNOSIS — E43 Unspecified severe protein-calorie malnutrition: Secondary | ICD-10-CM

## 2023-07-24 LAB — CBC WITH DIFFERENTIAL/PLATELET
Abs Immature Granulocytes: 0.02 10*3/uL (ref 0.00–0.07)
Basophils Absolute: 0 10*3/uL (ref 0.0–0.1)
Basophils Relative: 0 %
Eosinophils Absolute: 0 10*3/uL (ref 0.0–0.5)
Eosinophils Relative: 0 %
HCT: 40.4 % (ref 39.0–52.0)
Hemoglobin: 13.6 g/dL (ref 13.0–17.0)
Immature Granulocytes: 0 %
Lymphocytes Relative: 16 %
Lymphs Abs: 1.1 10*3/uL (ref 0.7–4.0)
MCH: 31.2 pg (ref 26.0–34.0)
MCHC: 33.7 g/dL (ref 30.0–36.0)
MCV: 92.7 fL (ref 80.0–100.0)
Monocytes Absolute: 0.7 10*3/uL (ref 0.1–1.0)
Monocytes Relative: 10 %
Neutro Abs: 4.8 10*3/uL (ref 1.7–7.7)
Neutrophils Relative %: 74 %
Platelets: 82 10*3/uL — ABNORMAL LOW (ref 150–400)
RBC: 4.36 MIL/uL (ref 4.22–5.81)
RDW: 16.7 % — ABNORMAL HIGH (ref 11.5–15.5)
WBC: 6.7 10*3/uL (ref 4.0–10.5)
nRBC: 0 % (ref 0.0–0.2)

## 2023-07-24 LAB — COOXEMETRY PANEL
Carboxyhemoglobin: 1.9 % — ABNORMAL HIGH (ref 0.5–1.5)
Carboxyhemoglobin: 2.1 % — ABNORMAL HIGH (ref 0.5–1.5)
Methemoglobin: <0.7 % (ref 0.0–1.5)
Methemoglobin: <0.7 % (ref 0.0–1.5)
O2 Saturation: 69.3 %
O2 Saturation: 69.6 %
Total hemoglobin: 13.7 g/dL (ref 12.0–16.0)
Total hemoglobin: 14.1 g/dL (ref 12.0–16.0)

## 2023-07-24 LAB — MAGNESIUM: Magnesium: 2.2 mg/dL (ref 1.7–2.4)

## 2023-07-24 LAB — BASIC METABOLIC PANEL
Anion gap: 11 (ref 5–15)
BUN: 25 mg/dL — ABNORMAL HIGH (ref 8–23)
CO2: 28 mmol/L (ref 22–32)
Calcium: 8.9 mg/dL (ref 8.9–10.3)
Chloride: 90 mmol/L — ABNORMAL LOW (ref 98–111)
Creatinine, Ser: 1.47 mg/dL — ABNORMAL HIGH (ref 0.61–1.24)
GFR, Estimated: 52 mL/min — ABNORMAL LOW (ref >60)
Glucose, Bld: 121 mg/dL — ABNORMAL HIGH (ref 70–99)
Potassium: 4.5 mmol/L (ref 3.5–5.1)
Sodium: 129 mmol/L — ABNORMAL LOW (ref 135–145)

## 2023-07-24 LAB — POCT ACTIVATED CLOTTING TIME
Activated Clotting Time: 141 s
Activated Clotting Time: 141 s

## 2023-07-24 LAB — HEPARIN LEVEL (UNFRACTIONATED): Heparin Unfractionated: <0.1 [IU]/mL — ABNORMAL LOW (ref 0.30–0.70)

## 2023-07-24 MED ORDER — DIAZEPAM 2 MG PO TABS
2.0000 mg | ORAL_TABLET | Freq: Once | ORAL | Status: AC
  Administered 2023-07-24: 2 mg via ORAL
  Filled 2023-07-24: qty 1

## 2023-07-24 NOTE — Progress Notes (Addendum)
Patient ID: [name] Daniel, male   DOB: 26-Feb-1956, 68 y.o.   MRN: [id number]     Advanced Heart Failure Rounding Note  Cardiologist: Dina Rich, MD  Chief Complaint: Cardiogenic Shock. Subjective:    2/17 Transferred ICU from cath lab. LHC nonobstructive CAD. IABP placed and continued on milrinone. Due to low CO/CI switched to DBA. CT Surgery/Structural Team consulted for aortic stenosis.  On DBA 5 mcg + IABP  1:2  CO-OX 69%  RA 3-4  PA 38/21 (26)  CO 4.8  CI 2.5    Having a hard time sleeping.    Objective:   Weight Range: 62.8 kg Body mass index is 18.27 kg/m.   Vital Signs:   Temp:  [97.2 F (36.2 C)-100 F (37.8 C)] 98.2 F (36.8 C) (02/19 0715) Pulse Rate:  [53-246] 179 (02/19 0715) Resp:  [11-31] 25 (02/19 0715) BP: (97-149)/(62-100) 123/100 (02/19 0700) SpO2:  [82 %-98 %] 89 % (02/19 0715) Weight:  [62.8 kg] 62.8 kg (02/19 0420) Last BM Date : 07/21/23  Weight change: Filed Weights   07/22/23 0402 07/23/23 0530 07/24/23 0420  Weight: 67.6 kg 64.5 kg 62.8 kg    Intake/Output:   Intake/Output Summary (Last 24 hours) at 07/24/2023 0726 Last data filed at 07/24/2023 0700 Gross per 24 hour  Intake 1307.36 ml  Output 2050 ml  Net -742.64 ml      Physical Exam  General:  No resp difficulty HEENT: normal Neck: . no JVD. Cor: PMI nondisplaced. Regular rate & rhythm. No rubs, gallops or murmurs. Lungs: clear Extremities: no cyanosis, clubbing, rash, edema.R Femoral IABP  Neuro: alert & oriented x3 Telemetry  SR 90s   Labs    CBC Recent Labs    07/23/23 0411 07/24/23 0415  WBC 5.7 6.7  NEUTROABS 3.9 4.8  HGB 13.3 13.6  HCT 39.7 40.4  MCV 92.5 92.7  PLT 84* 82*   Basic Metabolic Panel Recent Labs    40/98/11 0411 07/24/23 0415  NA 133* 129*  K 3.7 4.5  CL 87* 90*  CO2 34* 28  GLUCOSE 105* 121*  BUN 27* 25*  CREATININE 1.41* 1.47*  CALCIUM 8.8* 8.9  MG 2.3 2.2  PHOS 4.1  --    Liver Function Tests No results for input(s):  "AST", "ALT", "ALKPHOS", "BILITOT", "PROT", "ALBUMIN" in the last 72 hours.  No results for input(s): "LIPASE", "AMYLASE" in the last 72 hours. Cardiac Enzymes No results for input(s): "CKTOTAL", "CKMB", "CKMBINDEX", "TROPONINI" in the last 72 hours.  BNP: BNP (last 3 results) Recent Labs    06/03/23 2152 07/18/23 1609  BNP 3,234.0* >4,500.0*    ProBNP (last 3 results) No results for input(s): "PROBNP" in the last 8760 hours.   D-Dimer No results for input(s): "DDIMER" in the last 72 hours. Hemoglobin A1C No results for input(s): "HGBA1C" in the last 72 hours.  Fasting Lipid Panel No results for input(s): "CHOL", "HDL", "LDLCALC", "TRIG", "CHOLHDL", "LDLDIRECT" in the last 72 hours. Thyroid Function Tests No results for input(s): "TSH", "T4TOTAL", "T3FREE", "THYROIDAB" in the last 72 hours.  Invalid input(s): "FREET3"   Other results:   Imaging    No results found.    Medications:     Scheduled Medications:  aspirin EC  81 mg Oral Daily   Chlorhexidine Gluconate Cloth  6 each Topical Daily   cyanocobalamin  1,000 mcg Oral Daily   digoxin  0.125 mg Oral Daily   feeding supplement  237 mL Oral TID BM   folic  acid  1 mg Oral Daily   multivitamin with minerals  1 tablet Oral Daily   rosuvastatin  20 mg Oral Daily   sodium chloride flush  10-40 mL Intracatheter Q12H   sodium chloride flush  3 mL Intravenous Q12H   spironolactone  25 mg Oral Daily   thiamine  100 mg Oral Daily    Infusions:  DOBUTamine 5 mcg/kg/min (07/24/23 0700)   heparin 650 Units/hr (07/24/23 0700)    PRN Medications: acetaminophen, ondansetron (ZOFRAN) IV, mouth rinse, oxyCODONE, sodium chloride flush, sodium chloride flush, traMADol, zolpidem   Assessment/Plan   1. Acute on chronic systolic CHF/cardiogenic shock: Echo in 12/24 showed EF 20%, mild RV dysfunction, severe low flow/low gradient aortic stenosis with mean gradient 35 mmHg and AVA 0.49 cm^2. Cause of cardiomyopathy is  uncertain.  He has severe aortic stenosis.  He also has anterior Qs on ECG so prior MI is certainly possible. He was re-admitted with marked volume overload and low cardiac output, co-ox 38%.  He was started on milrinone which was increased to 0.375 and diuresed with lasix drip. 2/17cath - IABP placed and continued on Milrinone. Due to low CO/CI he was switched to Dobutamine 5 mcg. Hemodynamics  stable over the last 24 hours.CO 4.8 CI 2.5. CO-OX 69%   Continue Dobutamine 5 +IABP 1:2. Anticipate weaning today.  - Continue spiro 25 mg daily.  - Continue digoxin 0.125 daily.  2. Elevated troponin: Mild elevation with no trend. Suspect demand ischemia with volume overload.  -LHC with nonobstructive CAD - Continue statin - Continue ASA 81 3. Aortic stenosis: Low flow/low gradient severe AS on 12/24 echo. This contributes to CHF.  He will ultimately need the aortic valve replaced.  -CT Surgery/Structural Team consulted. 4. Thrombocytopenia: Platelets have been mildly low since 12/24.  Follow.  5. Ascending aortic aneurysm: 5.0 cm on 12/24 CTA chest.  6. H/o ETOH abuse: Not very forthcoming on current drinking habits but apparently has quit for about 6 months.   SDOH- Difficult obtaining information from him. There is some question that he is homeless. His sister is his point of contact.but he did not want [place] to reach out. Consult TOC.   Discussed with Dr Gala Romney. Start to wean IABP 1:3 and hopefully remove later today.   Length of Stay: 6  Amy Clegg, NP  07/24/2023, 7:26 AM  Advanced Heart Failure Team Pager [phone] (M-F; 7a - 5p)  Please contact [facility] for night-coverage after hours (5p -7a ) and weekends on [email or web address]  Agree with above.   Remains on IABP. 1:2 and DBA 5    Minimally interactive. RN says he didn't sleep well last night and he doesn't want to talk.   Ernestine Conrad numbers done personally.   PAP: (21-54)/(10-35) 22/14 CVP:  [0 mmHg-10 mmHg] 1 mmHg CO:  [3.7 L/min-5.3  L/min] 4.6 L/min CI:  [1.92 L/min/m2-2.77 L/min/m2] 2.4 L/min/m2  Having some NSVT on monitor.   General:  Cachetic. Chronically ill appearing.  HEENT: normal Neck: supple.RIJ swan  Carotids 2+ bilat; no bruits. No lymphadenopathy or thryomegaly appreciated. Cor: Regular rate & rhythm. 3/6 AS Lungs: clear Abdomen: soft, nontender, nondistended. No hepatosplenomegaly. No bruits or masses. Good bowel sounds. Extremities: no cyanosis, clubbing, rash, edema Neuro: alert & orientedx3, cranial nerves grossly intact. moves all 4 extremities w/o difficulty.  Flat affect.   I discussed situation with Structural Heart team and TCTS. Not a candidate for SVR/aortic root replacement.  Structural team feels TAVR may be possible but would  be quite high risk with poor EF, dilated aorta and poor functional status. Will need CTs to further risk stratify.   I spoke with Mr. Steinmeyer and made it very clear that we need his input on whether or not he wants to proceed and he remained indifferent. I told him that we would not proceed with the work-up unless he could tell me that he definitely  wanted to proceed. Will pull IABP today to permit TAVR w/u per Structural team.   Given severe myocardial dysfunction and fraility, I suspect best option is Palliative Care.   CRITICAL CARE Performed by: Arvilla Meres  Total critical care time: 40 minutes  Critical care time was exclusive of separately billable procedures and treating other patients.  Critical care was necessary to treat or prevent imminent or life-threatening deterioration.  Critical care was time spent personally by me (independent of midlevel providers or residents) on the following activities: development of treatment plan with patient and/or surrogate as well as nursing, discussions with consultants, evaluation of patient's response to treatment, examination of patient, obtaining history from patient or surrogate, ordering and performing  treatments and interventions, ordering and review of laboratory studies, ordering and review of radiographic studies, pulse oximetry and re-evaluation of patient's condition.  Arvilla Meres, MD  3:33 PM

## 2023-07-24 NOTE — Plan of Care (Signed)
  Problem: Nutrition: Goal: Adequate nutrition will be maintained Outcome: Progressing   Problem: Coping: Goal: Level of anxiety will decrease Outcome: Progressing   Problem: Pain Managment: Goal: General experience of comfort will improve and/or be controlled Outcome: Progressing   Problem: Cardiac: Goal: Ability to achieve and maintain adequate cardiopulmonary perfusion will improve Outcome: Progressing   Problem: Cardiovascular: Goal: Ability to achieve and maintain adequate cardiovascular perfusion will improve Outcome: Progressing Goal: Vascular access site(s) Level 0-1 will be maintained Outcome: Progressing

## 2023-07-24 NOTE — Progress Notes (Signed)
PHARMACY - ANTICOAGULATION CONSULT NOTE  Pharmacy Consult for heparin Indication:  IABP  Labs: Recent Labs    07/22/23 0249 07/22/23 1119 07/22/23 1120 07/22/23 2003 07/23/23 0411 07/24/23 0415  HGB 14.7   < > 16.0  --  13.3 13.6  HCT 42.6   < > 47.0  --  39.7 40.4  PLT 106*  --   --   --  84* 82*  HEPARINUNFRC  --   --   --  0.46 0.32 <0.10*  CREATININE 1.55*  --   --   --  1.41* 1.47*   < > = values in this interval not displayed.   Assessment: 68yo male subtherapeutic on heparin after two levels at goal; no infusion issues per RN but she does note some oozing at IABP site, improved since earlier in shift.  Goal of Therapy:  Heparin level 0.2-0.5 units/ml   Plan:  Increase heparin infusion cautiously to 650 units/hr. Check level in 8 hours.   Vernard Gambles, PharmD, BCPS 07/24/2023 5:22 AM

## 2023-07-24 NOTE — CV Procedure (Signed)
  IABP removal note  IABP weaned to 1:3. Patient tolerated well.  Heparin held until ACT < 150s  IABP mobilized. Device turned off. I pulled balloon down to sheath and then pulled balloon and sheath as a single unit. Manual pressure held over femoral artery personally for 30 minutes. Good hemostasis. Dressing and fem stop applied.   6 hours bedrest.  Arvilla Meres, MD  4:50 PM

## 2023-07-25 ENCOUNTER — Inpatient Hospital Stay (HOSPITAL_COMMUNITY): Payer: Medicare Other

## 2023-07-25 DIAGNOSIS — I350 Nonrheumatic aortic (valve) stenosis: Secondary | ICD-10-CM

## 2023-07-25 LAB — CBC WITH DIFFERENTIAL/PLATELET
Abs Immature Granulocytes: 0.01 10*3/uL (ref 0.00–0.07)
Basophils Absolute: 0 10*3/uL (ref 0.0–0.1)
Basophils Relative: 1 %
Eosinophils Absolute: 0.1 10*3/uL (ref 0.0–0.5)
Eosinophils Relative: 1 %
HCT: 40.6 % (ref 39.0–52.0)
Hemoglobin: 13.8 g/dL (ref 13.0–17.0)
Immature Granulocytes: 0 %
Lymphocytes Relative: 20 %
Lymphs Abs: 1.2 10*3/uL (ref 0.7–4.0)
MCH: 31.6 pg (ref 26.0–34.0)
MCHC: 34 g/dL (ref 30.0–36.0)
MCV: 92.9 fL (ref 80.0–100.0)
Monocytes Absolute: 0.7 10*3/uL (ref 0.1–1.0)
Monocytes Relative: 12 %
Neutro Abs: 4 10*3/uL (ref 1.7–7.7)
Neutrophils Relative %: 66 %
Platelets: 81 10*3/uL — ABNORMAL LOW (ref 150–400)
RBC: 4.37 MIL/uL (ref 4.22–5.81)
RDW: 16.5 % — ABNORMAL HIGH (ref 11.5–15.5)
Smear Review: DECREASED
WBC: 6 10*3/uL (ref 4.0–10.5)
nRBC: 0 % (ref 0.0–0.2)

## 2023-07-25 LAB — BASIC METABOLIC PANEL
Anion gap: 9 (ref 5–15)
BUN: 24 mg/dL — ABNORMAL HIGH (ref 8–23)
CO2: 29 mmol/L (ref 22–32)
Calcium: 8.8 mg/dL — ABNORMAL LOW (ref 8.9–10.3)
Chloride: 92 mmol/L — ABNORMAL LOW (ref 98–111)
Creatinine, Ser: 1.33 mg/dL — ABNORMAL HIGH (ref 0.61–1.24)
GFR, Estimated: 59 mL/min — ABNORMAL LOW (ref >60)
Glucose, Bld: 102 mg/dL — ABNORMAL HIGH (ref 70–99)
Potassium: 4.7 mmol/L (ref 3.5–5.1)
Sodium: 130 mmol/L — ABNORMAL LOW (ref 135–145)

## 2023-07-25 LAB — COOXEMETRY PANEL
Carboxyhemoglobin: 2.3 % — ABNORMAL HIGH (ref 0.5–1.5)
Methemoglobin: <0.7 % (ref 0.0–1.5)
O2 Saturation: 66.6 %
Total hemoglobin: 14.2 g/dL (ref 12.0–16.0)

## 2023-07-25 LAB — DIGOXIN LEVEL: Digoxin Level: 0.5 ng/mL — ABNORMAL LOW (ref 0.8–2.0)

## 2023-07-25 LAB — MAGNESIUM: Magnesium: 2.3 mg/dL (ref 1.7–2.4)

## 2023-07-25 MED ORDER — GABAPENTIN 100 MG PO CAPS
200.0000 mg | ORAL_CAPSULE | Freq: Once | ORAL | Status: AC
  Administered 2023-07-25: 200 mg via ORAL
  Filled 2023-07-25: qty 2

## 2023-07-25 MED ORDER — IOHEXOL 350 MG/ML SOLN
95.0000 mL | Freq: Once | INTRAVENOUS | Status: AC | PRN
  Administered 2023-07-25: 95 mL via INTRAVENOUS

## 2023-07-25 MED ORDER — ENOXAPARIN SODIUM 40 MG/0.4ML IJ SOSY
40.0000 mg | PREFILLED_SYRINGE | INTRAMUSCULAR | Status: DC
  Administered 2023-07-25 – 2023-07-31 (×6): 40 mg via SUBCUTANEOUS
  Filled 2023-07-25 (×6): qty 0.4

## 2023-07-25 MED ORDER — SODIUM CHLORIDE 0.9 % IV SOLN: INTRAVENOUS | Status: AC | PRN

## 2023-07-25 MED ORDER — MAGNESIUM HYDROXIDE 400 MG/5ML PO SUSP
15.0000 mL | Freq: Every day | ORAL | Status: DC | PRN
  Filled 2023-07-25 (×2): qty 30

## 2023-07-25 NOTE — TOC Progression Note (Signed)
Transition of Care [facility]) - Progression Note    Patient Details  Name: [name] Daniel MRN: [id number] Date of Birth: November 03, 1955  Transition of Care [facility]) CM/SW Contact  Elliot Cousin, RN Phone Number:  [phone] 07/25/2023, 10:18 AM  Clinical Narrative:    TOC CM spoke to pt and states pt lives independently in an apt on their parents property. She has RW for him to use at home. States she was not aware he did not have Med B &D. She takes him to appts. States he has not been to a doctor. Will follow up with Financial Counselor to see if they can start Medicaid application.   07/23/2023 HF TOC CM spoke to pt at bedside. Gave permission to speak to sister.    Expected Discharge Plan: Home w Home Health Services Barriers to Discharge: Continued Medical Work up  Expected Discharge Plan and Services In-house Referral: Clinical Social Work     Living arrangements for the past 2 months: Single Family Home                                       Social Determinants of Health (SDOH) Interventions SDOH Screenings   Food Insecurity: No Food Insecurity (07/18/2023)  Housing: Low Risk  (07/18/2023)  Transportation Needs: No Transportation Needs (07/18/2023)  Utilities: Not At Risk (07/18/2023)  Social Connections: Socially Isolated (07/18/2023)  Tobacco Use: Medium Risk (07/18/2023)    Readmission Risk Interventions    05/09/2023   12:49 PM  Readmission Risk Prevention Plan  Post Dischage Appt Not Complete  Medication Screening Complete  Transportation Screening Complete

## 2023-07-25 NOTE — Plan of Care (Signed)
[name] Daniel catheter remains in today with CI > 2. CVP 2 and patient no longer has any JVD. Now getting OOB to chair. PT to begin working with patient tomorrow. CT scans done today.

## 2023-07-25 NOTE — Progress Notes (Addendum)
Patient ID: [name] Daniel, male   DOB: 11-19-1955, 68 y.o.   MRN: [id number]     Advanced Heart Failure Rounding Note  Cardiologist: Dina Rich, MD  Chief Complaint: Cardiogenic Shock. Subjective:    2/17 Transferred ICU from cath lab. LHC nonobstructive CAD. IABP placed and continued on milrinone. Due to low CO/CI switched to DBA. CT Surgery/Structural Team consulted for aortic stenosis. 2/19: IABP removed.   On DBA 5 mcg . CO-OX 67%.  PAP: (21-62)/(10-43) 46/31 CVP:  [0 mmHg-15 mmHg] 6 mmHg CO:  [3.6 L/min-4.8 L/min] 4.5 L/min CI:  [1.9 L/min/m2-2.5 L/min/m2] 2.4 L/min/m2   Wants to pursue TAVR . Complaining fatigue. Denies SOB.   Objective:   Weight Range: 62.3 kg Body mass index is 18.12 kg/m.   Vital Signs:   Temp:  [98.1 F (36.7 C)-99.3 F (37.4 C)] 98.4 F (36.9 C) (02/20 0615) Pulse Rate:  [103-218] 110 (02/20 0615) Resp:  [9-32] 13 (02/20 0615) BP: (87-126)/(53-104) 103/87 (02/20 0515) SpO2:  [81 %-97 %] 96 % (02/20 0615) Weight:  [62.3 kg] 62.3 kg (02/20 0316) Last BM Date : 07/21/23  Weight change: Filed Weights   07/24/23 0420 07/24/23 2300 07/25/23 0316  Weight: 62.8 kg 62.3 kg 62.3 kg    Intake/Output:   Intake/Output Summary (Last 24 hours) at 07/25/2023 0800 Last data filed at 07/25/2023 0700 Gross per 24 hour  Intake 154.37 ml  Output 2700 ml  Net -2545.63 ml     Physical Exam  General:  Thin No resp difficulty Neck: . no JVD. RIJ  Cor: PMI nondisplaced. Tachy Regular rate & rhythm. No rubs, gallops. 2/6 AS murmur.  Lungs: clear Extremities: no cyanosis, clubbing, rash, edema.R groin soft. Femoral IABP  Neuro: alert & oriented x3 Affect flat.  Telemetry  ST 100s   Labs    CBC Recent Labs    07/24/23 0415 07/25/23 0500  WBC 6.7 6.0  NEUTROABS 4.8 4.0  HGB 13.6 13.8  HCT 40.4 40.6  MCV 92.7 92.9  PLT 82* 81*   Basic Metabolic Panel Recent Labs    11/91/47 0411 07/24/23 0415 07/25/23 0500  NA 133* 129* 130*  K 3.7  4.5 4.7  CL 87* 90* 92*  CO2 34* 28 29  GLUCOSE 105* 121* 102*  BUN 27* 25* 24*  CREATININE 1.41* 1.47* 1.33*  CALCIUM 8.8* 8.9 8.8*  MG 2.3 2.2 2.3  PHOS 4.1  --   --    Liver Function Tests No results for input(s): "AST", "ALT", "ALKPHOS", "BILITOT", "PROT", "ALBUMIN" in the last 72 hours.  No results for input(s): "LIPASE", "AMYLASE" in the last 72 hours. Cardiac Enzymes No results for input(s): "CKTOTAL", "CKMB", "CKMBINDEX", "TROPONINI" in the last 72 hours.  BNP: BNP (last 3 results) Recent Labs    06/03/23 2152 07/18/23 1609  BNP 3,234.0* >4,500.0*    ProBNP (last 3 results) No results for input(s): "PROBNP" in the last 8760 hours.   D-Dimer No results for input(s): "DDIMER" in the last 72 hours. Hemoglobin A1C No results for input(s): "HGBA1C" in the last 72 hours.  Fasting Lipid Panel No results for input(s): "CHOL", "HDL", "LDLCALC", "TRIG", "CHOLHDL", "LDLDIRECT" in the last 72 hours. Thyroid Function Tests No results for input(s): "TSH", "T4TOTAL", "T3FREE", "THYROIDAB" in the last 72 hours.  Invalid input(s): "FREET3"   Other results:   Imaging    No results found.    Medications:     Scheduled Medications:  aspirin EC  81 mg Oral Daily   Chlorhexidine  Gluconate Cloth  6 each Topical Daily   cyanocobalamin  1,000 mcg Oral Daily   digoxin  0.125 mg Oral Daily   feeding supplement  237 mL Oral TID BM   folic acid  1 mg Oral Daily   multivitamin with minerals  1 tablet Oral Daily   rosuvastatin  20 mg Oral Daily   sodium chloride flush  10-40 mL Intracatheter Q12H   sodium chloride flush  3 mL Intravenous Q12H   spironolactone  25 mg Oral Daily   thiamine  100 mg Oral Daily    Infusions:  DOBUTamine 5 mcg/kg/min (07/25/23 0700)    PRN Medications: acetaminophen, ondansetron (ZOFRAN) IV, mouth rinse, oxyCODONE, sodium chloride flush, sodium chloride flush, traMADol, zolpidem   Assessment/Plan   1. Acute on chronic systolic  CHF/cardiogenic shock: Echo in 12/24 showed EF 20%, mild RV dysfunction, severe low flow/low gradient aortic stenosis with mean gradient 35 mmHg and AVA 0.49 cm^2. 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  He has severe aortic stenosis.  He also has anterior Qs on ECG so prior MI is certainly possible. He was re-admitted with marked volume overload and low cardiac output, co-ox 38%.  He was started on milrinone which was increased to 0.375 and diuresed with lasix drip. 2/17cath - IABP placed and continued on Milrinone. Due to low CO/CI he was switched to Dobutamine 5 mcg. 2/19 IABP removed. Hemodynamics stable. CO-OX stable.  Remains tachycardic. Cut back dobutamine to 4 mcg. Repeat CO-OX in 1 hour - Continue spiro 25 mg daily.  - Continue digoxin 0.125 daily.  2. Elevated troponin: Mild elevation with no trend. Suspect demand ischemia with volume overload.  -LHC with nonobstructive CAD - Continue statin - Continue ASA 81 3. Aortic stenosis: Low flow/low gradient severe AS on 12/24 echo. This contributes to CHF.  He will ultimately need the aortic valve replaced.  -CT Surgery/Structural Team consulted. - Considering TAVR. Needs scans.  4. Thrombocytopenia: Platelets have been mildly low since 12/24.  Follow.  5. Ascending aortic aneurysm: 5.0 cm on 12/24 CTA chest.  6. H/o ETOH abuse: Not very forthcoming on current drinking habits but apparently has quit for about 6 months.  7. Deconditioning Had difficulty standing. Will consult PT 8. Hyponatremia Restrict free water.   SDOH- Difficult obtaining information from him.   Critical Care Time: 20 minutes.   Length of Stay: 7  Amy Clegg, NP  07/25/2023, 8:00 AM  Advanced Heart Failure Team Pager [phone] (M-F; 7a - 5p)  Please contact [facility] for night-coverage after   Agree with above  IABP pulled yesterday. Groin site stable. Remains on DBA 5  Co-ox 67%  Swan numbers reviewed personally.   PAP: (21-62)/(10-43) 49/29 CVP:  [0  mmHg-15 mmHg] 4 mmHg CO:  [3.6 L/min-4.8 L/min] 4.5 L/min CI:  [1.9 L/min/m2-2.5 L/min/m2] 2.4 L/min/m2  Remains tachycardic. Very weak Flat affect Denies CP or SOB. Says he wants to proceed with TAVR if candidate  General:  Weak appearing. Cachetic No resp difficulty HEENT: normal Neck: supple. RIJ swan  Carotids 2+ bilat; no bruits. No lymphadenopathy or thryomegaly appreciated. Cor: Regular rate & rhythm. 2/6 AS Lungs: clear Abdomen: soft, nontender, nondistended. No hepatosplenomegaly. No bruits or masses. Good bowel sounds. Extremities: no cyanosis, clubbing, rash, edema  RFA site ok Neuro: alert & orientedx3, cranial nerves grossly intact. moves all 4 extremities w/o difficulty. Affect pleasant  He has end-stage HF with LF/LG AS and ascending aortic aneurysm. Hard to know how much of LV dysfunction  related to AS versus underlying NICM.   Will plan for TAVR CTs today to further evaluated candidacy for TAVR. However, given progressive debility, not sure he will benefit.  Can decreased DBA to 4.   Interactions remain limited due to flat affect.   CRITICAL CARE Performed by: Arvilla Meres  Total critical care time: 45 minutes  Critical care time was exclusive of separately billable procedures and treating other patients.  Critical care was necessary to treat or prevent imminent or life-threatening deterioration.  Critical care was time spent personally by me (independent of midlevel providers or residents) on the following activities: development of treatment plan with patient and/or surrogate as well as nursing, discussions with consultants, evaluation of patient's response to treatment, examination of patient, obtaining history from patient or surrogate, ordering and performing treatments and interventions, ordering and review of laboratory studies, ordering and review of radiographic studies, pulse oximetry and re-evaluation of patient's condition.  Arvilla Meres, MD   11:30 AM

## 2023-07-26 DIAGNOSIS — I350 Nonrheumatic aortic (valve) stenosis: Secondary | ICD-10-CM

## 2023-07-26 DIAGNOSIS — I5021 Acute systolic (congestive) heart failure: Secondary | ICD-10-CM

## 2023-07-26 LAB — URINALYSIS, ROUTINE W REFLEX MICROSCOPIC
Bilirubin Urine: NEGATIVE
Glucose, UA: 150 mg/dL — AB
Ketones, ur: NEGATIVE mg/dL
Nitrite: NEGATIVE
Protein, ur: 100 mg/dL — AB
Specific Gravity, Urine: 1.024 (ref 1.005–1.030)
WBC, UA: >50 WBC/hpf (ref 0–5)
pH: 6 (ref 5.0–8.0)

## 2023-07-26 LAB — BASIC METABOLIC PANEL
Anion gap: 10 (ref 5–15)
BUN: 26 mg/dL — ABNORMAL HIGH (ref 8–23)
CO2: 27 mmol/L (ref 22–32)
Calcium: 8.8 mg/dL — ABNORMAL LOW (ref 8.9–10.3)
Chloride: 94 mmol/L — ABNORMAL LOW (ref 98–111)
Creatinine, Ser: 1.02 mg/dL (ref 0.61–1.24)
GFR, Estimated: >60 mL/min (ref >60)
Glucose, Bld: 109 mg/dL — ABNORMAL HIGH (ref 70–99)
Potassium: 4.6 mmol/L (ref 3.5–5.1)
Sodium: 131 mmol/L — ABNORMAL LOW (ref 135–145)

## 2023-07-26 LAB — PROCALCITONIN: Procalcitonin: <0.1 ng/mL

## 2023-07-26 LAB — MAGNESIUM: Magnesium: 2.2 mg/dL (ref 1.7–2.4)

## 2023-07-26 LAB — CBC WITH DIFFERENTIAL/PLATELET
Abs Immature Granulocytes: 0.01 10*3/uL (ref 0.00–0.07)
Basophils Absolute: 0.1 10*3/uL (ref 0.0–0.1)
Basophils Relative: 1 %
Eosinophils Absolute: 0.1 10*3/uL (ref 0.0–0.5)
Eosinophils Relative: 2 %
HCT: 38.2 % — ABNORMAL LOW (ref 39.0–52.0)
Hemoglobin: 12.9 g/dL — ABNORMAL LOW (ref 13.0–17.0)
Immature Granulocytes: 0 %
Lymphocytes Relative: 26 %
Lymphs Abs: 1.4 10*3/uL (ref 0.7–4.0)
MCH: 31.6 pg (ref 26.0–34.0)
MCHC: 33.8 g/dL (ref 30.0–36.0)
MCV: 93.6 fL (ref 80.0–100.0)
Monocytes Absolute: 0.6 10*3/uL (ref 0.1–1.0)
Monocytes Relative: 12 %
Neutro Abs: 3.1 10*3/uL (ref 1.7–7.7)
Neutrophils Relative %: 59 %
Platelets: 94 10*3/uL — ABNORMAL LOW (ref 150–400)
RBC: 4.08 MIL/uL — ABNORMAL LOW (ref 4.22–5.81)
RDW: 16.7 % — ABNORMAL HIGH (ref 11.5–15.5)
WBC: 5.3 10*3/uL (ref 4.0–10.5)
nRBC: 0 % (ref 0.0–0.2)

## 2023-07-26 LAB — COOXEMETRY PANEL
Carboxyhemoglobin: 1.4 % (ref 0.5–1.5)
Carboxyhemoglobin: 1.6 % — ABNORMAL HIGH (ref 0.5–1.5)
Methemoglobin: <0.7 % (ref 0.0–1.5)
Methemoglobin: <0.7 % (ref 0.0–1.5)
O2 Saturation: 60.2 %
O2 Saturation: 62 %
Total hemoglobin: 13.2 g/dL (ref 12.0–16.0)
Total hemoglobin: 13.3 g/dL (ref 12.0–16.0)

## 2023-07-26 MED ORDER — TRAZODONE HCL 50 MG PO TABS
50.0000 mg | ORAL_TABLET | Freq: Once | ORAL | Status: AC
  Administered 2023-07-26: 50 mg via ORAL
  Filled 2023-07-26: qty 1

## 2023-07-26 MED ORDER — SODIUM CHLORIDE 0.9 % IV SOLN: INTRAVENOUS | Status: AC | PRN

## 2023-07-26 NOTE — Evaluation (Signed)
Physical Therapy Evaluation Patient Details Name: [name] Daniel MRN: [id number] DOB: 13-Dec-1955 Today's Date: 07/26/2023  History of Present Illness  68 yo male admitted 2/13 after presenting to hernia repair followup visit with SOB, LB edema, CHF exacerbation. 2/17 RHC/LHC. IABP 2/17-2/19. [facility] 2/17-2/21. PMhx: TAA, HFrEF, severe AS, CM, T2DM, CKD, 12/24 hernia repair   Clinical Impression  Pt in bed upon arrival and agreeable to PT eval. Pt presents with condition above and deficits mentioned below, see PT Problem List. Pt reports being independent with no AD prior to admit. In today's session, pt was very unstable with no AD and required ModA for balance. Pt was not aware of being unstable and could not tell that he was leaning posteriorly. With RW, pt was able to ambulate ~80 ft with CGA and cues for proximity to RW and obstacle negotiation. While ambulating, pt has L foot inversion and LLE hyperextension thrust. Pt would benefit from acute skilled PT to address functional impairments. Pt lives alone and reports not having 24/7 assistance available. Recommending <3 hrs post-acute rehab to work towards independence with mobility and reduce falls risk. Acute PT to follow.          If plan is discharge home, recommend the following: A little help with walking and/or transfers;A little help with bathing/dressing/bathroom;Direct supervision/assist for medications management;Direct supervision/assist for financial management;Assist for transportation;Help with stairs or ramp for entrance;Supervision due to cognitive status   Can travel by private vehicle   Yes    Equipment Recommendations Rolling walker (2 wheels)  Recommendations for Other Services  OT consult    Functional Status Assessment Patient has had a recent decline in their functional status and demonstrates the ability to make significant improvements in function in a reasonable and predictable amount of time.      Precautions / Restrictions Precautions Precautions: Fall Restrictions Weight Bearing Restrictions Per Provider Order: No      Mobility  Bed Mobility Overal bed mobility: Needs Assistance Bed Mobility: Supine to Sit    Supine to sit: Contact guard    General bed mobility comments: CGA for safety    Transfers Overall transfer level: Needs assistance Equipment used: Rolling walker (2 wheels), None Transfers: Sit to/from Stand Sit to Stand: Mod assist    General transfer comment: ModA for stand with no AD, very unsteady with posterior lean. Increased stability with RW    Ambulation/Gait Ambulation/Gait assistance: Contact guard assist Gait Distance (Feet): 80 Feet Assistive device: Rolling walker (2 wheels), None Gait Pattern/deviations: Step-to pattern, Decreased weight shift to left, Antalgic, Narrow base of support, Knee hyperextension - left, Decreased step length - left Gait velocity: decr     General Gait Details: L knee hyperextension thrust with inverted foot, decreased step length on L LE. Very unsteady w/ no AD, slightly unsteady with MinA. Cues for RW management and for safety awareness    Balance Overall balance assessment: Needs assistance, Mild deficits observed, not formally tested Sitting-balance support: No upper extremity supported, Feet supported Sitting balance-Leahy Scale: Good   Postural control: Posterior lean Standing balance support: Bilateral upper extremity supported, During functional activity, Reliant on assistive device for balance Standing balance-Leahy Scale: Poor Standing balance comment: reliant on RW         Pertinent Vitals/Pain Pain Assessment Pain Assessment: No/denies pain    Home Living Family/patient expects to be discharged to:: Private residence Living Arrangements: Alone Available Help at Discharge: Family;Available PRN/intermittently (sister) Type of Home: Apartment Home Access: Level entry  Home Layout: One  level Home Equipment: None      Prior Function Prior Level of Function : Independent/Modified Independent    Mobility Comments: no AD ADLs Comments: sister helps drive     Extremity/Trunk Assessment   Upper Extremity Assessment Upper Extremity Assessment: Defer to OT evaluation    Lower Extremity Assessment Lower Extremity Assessment: LLE deficits/detail;Generalized weakness LLE Deficits / Details: bow-legged upon standing, walks with foot inverted w/ knee hyperextension thrust    Cervical / Trunk Assessment Cervical / Trunk Assessment: Normal  Communication   Communication Communication: No apparent difficulties    Cognition Arousal: Alert Behavior During Therapy: Flat affect   PT - Cognitive impairments: No family/caregiver present to determine baseline, Safety/Judgement, Problem solving      PT - Cognition Comments: decreased safety awareness, cues needed to avoid obstacles. Was not able to recognize instability or that his balance was impaired Following commands: Intact       Cueing Cueing Techniques: Verbal cues, Tactile cues         PT Assessment Patient needs continued PT services  PT Problem List Decreased strength;Decreased range of motion;Decreased activity tolerance;Decreased balance;Decreased mobility;Decreased knowledge of use of DME;Decreased safety awareness       PT Treatment Interventions DME instruction;Gait training;Therapeutic activities;Stair training;Functional mobility training;Therapeutic exercise;Balance training;Cognitive remediation;Neuromuscular re-education;Patient/family education    PT Goals (Current goals can be found in the Care Plan section)  Acute Rehab PT Goals Patient Stated Goal: to get better PT Goal Formulation: With patient Time For Goal Achievement: 08/09/23 Potential to Achieve Goals: Good    Frequency Min 1X/week        AM-PAC PT "6 Clicks" Mobility  Outcome Measure Help needed turning from your back to your  side while in a flat bed without using bedrails?: A Little Help needed moving from lying on your back to sitting on the side of a flat bed without using bedrails?: A Little Help needed moving to and from a bed to a chair (including a wheelchair)?: A Little Help needed standing up from a chair using your arms (e.g., wheelchair or bedside chair)?: A Little Help needed to walk in hospital room?: A Little Help needed climbing 3-5 steps with a railing? : A Lot 6 Click Score: 17    End of Session Equipment Utilized During Treatment: Gait belt Activity Tolerance: Patient tolerated treatment well Patient left: in chair;with call bell/phone within reach;with chair alarm set Nurse Communication: Mobility status PT Visit Diagnosis: Unsteadiness on feet (R26.81);Muscle weakness (generalized) (M62.81);Difficulty in walking, not elsewhere classified (R26.2)    Time: 1191-4782 PT Time Calculation (min) (ACUTE ONLY): 32 min   Charges:   PT Evaluation $PT Eval Low Complexity: 1 Low PT Treatments $Gait Training: 8-22 mins PT General Charges $$ ACUTE PT VISIT: 1 Visit       Hilton Cork, PT, DPT Secure Chat Preferred  Rehab Office [phone]   Arturo Morton Brion Aliment 07/26/2023, 12:25 PM

## 2023-07-26 NOTE — Progress Notes (Addendum)
[facility]   MULTIDISCIPLINARY HEART VALVE TEAM  Patient Name: [name] Daniel Date of Encounter: 07/26/2023  Admit date: 07/18/2023  Primary Care Provider: Patient, No Pcp Per [facility] Cardiologist: Dina Rich, MD  [facility] Electrophysiologist:  None   Hospital Problem List     Principal Problem:   Acute on chronic systolic CHF (congestive heart failure) (HCC) Active Problems:   Thrombocytopenia (HCC)   Stage 3a chronic kidney disease (CKD) (HCC)   Protein-calorie malnutrition, severe   T2DM (type 2 diabetes mellitus) (HCC)   History of alcohol abuse   Subjective   Sitting up in chair. Feels uneasy. Has gotten up to walk a little and felt weak/wobbly. Otherwise not very conversant.   Inpatient Medications    Scheduled Meds:  aspirin EC  81 mg Oral Daily   Chlorhexidine Gluconate Cloth  6 each Topical Daily   cyanocobalamin  1,000 mcg Oral Daily   digoxin  0.125 mg Oral Daily   enoxaparin (LOVENOX) injection  40 mg Subcutaneous Q24H   feeding supplement  237 mL Oral TID BM   folic acid  1 mg Oral Daily   multivitamin with minerals  1 tablet Oral Daily   rosuvastatin  20 mg Oral Daily   sodium chloride flush  10-40 mL Intracatheter Q12H   sodium chloride flush  3 mL Intravenous Q12H   spironolactone  25 mg Oral Daily   thiamine  100 mg Oral Daily   Continuous Infusions:  DOBUTamine 2.5 mcg/kg/min (07/26/23 1000)   PRN Meds: acetaminophen, magnesium hydroxide, ondansetron (ZOFRAN) IV, mouth rinse, oxyCODONE, sodium chloride flush, sodium chloride flush, traMADol, zolpidem   Vital Signs    Vitals:   07/26/23 1040 07/26/23 1100 07/26/23 1106 07/26/23 1135  BP:      Pulse:  (!) 111 (!) 104   Resp:  17 (!) 23   Temp: 98.2 F (36.8 C)   97.9 F (36.6 C)  TempSrc:    Oral  SpO2:  98% 92%   Weight:      Height:        Intake/Output Summary (Last 24 hours) at 07/26/2023 1300 Last data filed at 07/26/2023 1000 Gross per 24 hour   Intake 1006.74 ml  Output 1200 ml  Net -193.26 ml   Filed Weights   07/24/23 2300 07/25/23 0316 07/26/23 0500  Weight: 62.3 kg 62.3 kg 63.3 kg    Physical Exam    GEN: thin white male. Sitting up in chair HEENT: Grossly normal. Three remaining teeth in poor condition  Neck: no JVD Cardiac: normal S1, S2; regular, tachy, 3/6 harsh SEM heard best at RUSB.  Respiratory:  Respirations regular and unlabored, clear to auscultation bilaterally. GI: Soft, nontender, nondistended, BS + x 4. MS: no deformity or atrophy. Skin: warm and dry, no rash. Neuro:  Strength and sensation are intact. Psych:flat affect  Labs    CBC Recent Labs    07/25/23 0500 07/26/23 0421  WBC 6.0 5.3  NEUTROABS 4.0 3.1  HGB 13.8 12.9*  HCT 40.6 38.2*  MCV 92.9 93.6  PLT 81* 94*   Basic Metabolic Panel Recent Labs    16/10/96 0500 07/26/23 0421  NA 130* 131*  K 4.7 4.6  CL 92* 94*  CO2 29 27  GLUCOSE 102* 109*  BUN 24* 26*  CREATININE 1.33* 1.02  CALCIUM 8.8* 8.8*  MG 2.3 2.2    Telemetry    Sinus tachy HRs in low 100s. 11 beat run NSVT - Personally  Reviewed  ECG    No new tracing   Cardiac Studies   Cardiac CT Narrative & Impression  CLINICAL DATA:  Aortic Valve pathology with assessment for TAVR   EXAM: Cardiac TAVR CT   FINDINGS: Aortic Valve: Severely thickened aortic valve with calcification and reduced excursion the planimeter valve area is 0.942 Sq cm consistent with severe aortic stenosis   Morphology: Three cusp, bicuspid valve but with only one normal commissure (left-non). 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   Annular calcification: Moderate- two mild calcifications.   Aortic Valve Calcium Score: 6119   Perimembranous septal diameter: 4 mm   Mitral Valve: No calcifications   Aortic Annulus Measurements   Major annulus diameter: 33 mm   Minor annulus diameter: 30 mm   Annular perimeter: 98 mm   Annular area: 7.51 cm2   Aortic Measurements    Sinotubular Junction: 37 mm   Ascending Thoracic Aorta: 52 mm   Aortic Arch: 35 mm   Descending Thoracic Aorta: 30 mm   Sinus of Valsalva Measurements:   Right coronary cusp width: 39 mm   Left coronary cusp width: 39 mm   Non coronary cusp width: 44 mm   Coronary Artery Height above Annulus:   Left Main: 18 mm   Left SoV height: 29 mm   Right Coronary: 25 mm   Right SoV height: 29 mm   Optimum Fluoroscopic Angle for Delivery: LAO 19, CAU 14   Cusp overlay view angle: RAO 0, CAU 35   Valves for structural team consideration: Heart team discussion recommended (scene saved)   Sapien not recommended   Evolut 34 mm valve is undersized for this annulus.   Non TAVR Valve Findings:   Coronary Arteries: Normal coronary origin. Study not completed with nitroglycerin.   Coronary Calcium Score: 7 (LCX)   Percentile: 23rd for age, sex, and race matched control.   Systemic veins: Normal anatomy.   Main Pulmonary artery: Severe dilation 38 mm; catheter noted   Pulmonary veins: Right middle pulmonary vein noted   Left atrial appendage: patent   Interatrial septum: No communications   Chamber dimensions: Biatrial dilation, biventricular dilation   Pericardium: No calcification   Extra Cardiac Findings as per separate reporting.   Notable artifacts: Cardiac motion   Image quality: Poor   IMPRESSION: 1. Severe Aortic stenosis. Concerns that this anatomy is not suitable for TAVR. Heart team discussion recommended.   2.  Large thoracic aortic aneurysm    Patient Profile     [name] Daniel is a 68 y.o. male with a hx of HTN, HFrEF, TAA (5cm), CKD stage IIIa, DMT2, thrombocytopenia, hyponatremia, alcohol abuse and severe LFLG AS who is being seen today for the evaluation of severe LFLG AS at the request of Dr. Gala Romney.   Assessment & Plan    Severe LFLG aortic stenosis: cardiac CT showed a functionally unicuspid valve. It is a three cusp, bicuspid valve  but with only one normal commissure (left-non). He technically sizes outside the IFU for a TAVR valve. There is a possibility that he could be treated with a 29 mm Edwards Sapien S3UR with 5cc extra volume place 80/20 aortic. He does have adequate vasculature for a TF approach. Dr. Excell Seltzer to follow with final recs.   Acute on chronic systolic CHF/cardiogenic shock: IABP removed 07/24/23. Dobutamine weaned down to 2.68mcg. Currently on spiro 25 mg daily and digoxin 0.125 daily. Being managed by the advanced CHF team.   Ascending aortic aneurysm: 5.1cm on CTA. Not  a surgical candidate.   Thrombocytopenia: PLT 94. Continue to follow.  Hyponatremia: NA 131. Free water restriction.   Weakness: seen by PT and needed "a little help with walking and/or transfers and little help with bathing/dressing/bathroom."   Pulmonary nodule: cardiac CT scan reported an incidental 12 x 6 mm medial left lower lobe subsolid nodule that will require follow up.   PNA: CT angio chest/abdomen/pelvis showed a dense focus of consolidation in the posterior basilar right lower lobe with surrounding ground-glass opacity, without significant volume loss, new from 06/04/2023 chest CT, compatible with pneumonia. Will discuss with primary team and likely start antibiotics.  Bladder wall thickening: CT angio chest/abdomen/pelvis showed  new moderate diffuse bladder wall thickening, suggesting cystitis. Will check a UA.   SignedCline Crock, PA-C  07/26/2023, 1:00 PM  Pager [phone]  Patient seen, examined. Available data reviewed. Agree with findings, assessment, and plan as outlined by Carlean Jews, PA-C.  On exam, the patient is alert with flat affect, in no distress.  HEENT is normal, JVP is normal, lungs are clear bilaterally, heart is regular rate and rhythm with a 3/6 harsh crescendo decrescendo murmur at the right upper sternal border, abdomen is soft, thin, nontender, extremities have no edema.  The patient's CTA  studies are reviewed and he has suitable transfemoral access for TAVR.  He has a large bicuspid valve with critical stenosis and a very high aortic valve calcium score.  I have reviewed his case with colleagues and we have received dentistry reports regarding his suitability for TAVR.  I think he can be treated with a 29 mm SAPIEN 3 valve with additional volume in the balloon.  The patient is at high risk for any intervention in the context of his cardiogenic shock and inotrope dependence.  He has severe LV dysfunction but fortunately does not have a significantly dilated left ventricle.  He does not have severe right heart failure.  It is possible that he could require temporary support during TAVR due to his cardiogenic shock and severe LV dysfunction.  I tried to discuss all of this with him today.  His affect remains flat but he was a little more conversant than he has previously been.  He clearly expresses that he would like to proceed with treatment as he understands that he would not survive without aortic valve intervention. Following the decision to proceed with transcatheter aortic valve replacement, a discussion has been held regarding what types of management strategies would be attempted intraoperatively in the event of life-threatening complications, including whether or not the patient would be considered a candidate for the use of cardiopulmonary bypass and/or conversion to open sternotomy for attempted surgical intervention.  The patient has been advised of a variety of complications that might develop including but not limited to risks of death, stroke, paravalvular leak, aortic dissection or other major vascular complications, aortic annulus rupture, device embolization, cardiac rupture or perforation, mitral regurgitation, acute myocardial infarction, arrhythmia, heart block or bradycardia requiring permanent pacemaker placement, congestive heart failure, respiratory failure, renal failure,  pneumonia, infection, other late complications related to structural valve deterioration or migration, or other complications that might ultimately cause a temporary or permanent loss of functional independence or other long term morbidity.  The patient provides full informed consent for the procedure as described and all questions were answered. Dr Leafy Ro to see as part of a multidisciplinary review of his case.  I suspect he would not be a candidate for surgical bailout due to his  severe weakness and degree of advanced heart failure.  Tonny Bollman, M.D. 07/26/2023 6:15 PM

## 2023-07-26 NOTE — Consult Note (Addendum)
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 should be considered for patients with CAC scores >=400 or >=75th percentile.  *2018 AHA/ACC/AACVPR/AAPA/ABC/ACPM/ADA/AGS/APhA/ASPC/NLA/PCNA Guideline on the Management of Blood Cholesterol: A Report of the American College of Cardiology/American Heart Association Task Force on Clinical Practice Guidelines. J Am Coll Cardiol. 2019;73(24):3168-3209.  Mahesh  Chandrasekhar  Electronically Signed: By: Riley Lam M.D. On: 07/25/2023 16:02     ______________________________________________________________________________________________      Laboratory Data:  High Sensitivity Troponin:   Recent Labs  Lab 07/18/23 1609 07/18/23 1846  TROPONINIHS 68*  68*     Chemistry Recent Labs  Lab 07/24/23 0415 07/25/23 0500 07/26/23 0421  NA 129* 130* 131*  K 4.5 4.7 4.6  CL 90* 92* 94*  CO2 28 29 27   GLUCOSE 121* 102* 109*  BUN 25* 24* 26*  CREATININE 1.47* 1.33* 1.02  CALCIUM 8.9 8.8* 8.8*  GFRNONAA 52* 59* >60  ANIONGAP 11 9 10     No results for input(s): "PROT", "ALBUMIN", "AST", "ALT", "ALKPHOS", "BILITOT" in the last 168 hours. Hematology Recent Labs  Lab 07/24/23 0415 07/25/23 0500 07/26/23 0421  WBC 6.7 6.0 5.3  RBC 4.36 4.37 4.08*  HGB 13.6 13.8 12.9*  HCT 40.4 40.6 38.2*  MCV 92.7 92.9 93.6  MCH 31.2 31.6 31.6  MCHC 33.7 34.0 33.8  RDW 16.7* 16.5* 16.7*  PLT 82* 81* 94*   BNPNo results for input(s): "BNP", "PROBNP" in the last 168 hours.  DDimer No results for input(s): "DDIMER" in the last 168 hours.   Radiology/Studies:  CT ANGIO CHEST AORTA W/CM & OR WO/CM Result Date: 07/26/2023 CLINICAL DATA:  Aortic valve replacement (TAVR), pre-op eval. Inpatient. EXAM:  CT ANGIOGRAPHY CHEST, ABDOMEN AND PELVIS TECHNIQUE: Multidetector CT imaging through the chest, abdomen and pelvis was performed using the standard protocol during bolus administration of intravenous contrast. Multiplanar reconstructed images and MIPs were obtained and reviewed to evaluate the vascular anatomy. RADIATION DOSE REDUCTION: This exam was performed according to the departmental dose-optimization program which includes automated exposure control, adjustment of the mA and/or kV according to patient size and/or use of iterative reconstruction technique. CONTRAST:  95mL OMNIPAQUE IOHEXOL 350 MG/ML SOLN COMPARISON:  06/03/2013 chest CT angiogram and 05/08/2023 CT abdomen/pelvis. FINDINGS: CTA CHEST FINDINGS Cardiovascular: Moderate cardiomegaly, similar. Diffuse thickening and coarse calcification of the aortic valve usually correlating with aortic stenosis. Right internal jugular Swan-Ganz catheter with tip in the distal right pulmonary artery with expected  surrounding soft tissue gas in the right neck. Right PICC terminates in the lower third of the SVC. No significant pericardial effusion/thickening. Left circumflex coronary atherosclerosis. Atherosclerotic thoracic aorta with 5.1 cm ascending thoracic aortic aneurysm. Descending thoracic aortic diameter 3.7 cm. Aortic diameter 2.9 cm at the diaphragmatic hiatus. No thoracic aortic dissection or penetrating atherosclerotic ulcer. Patent aortic arch branch vessels. Dilated main pulmonary artery (3.7 cm diameter). No central pulmonary emboli. Mediastinum/Nodes: No significant thyroid nodules. Unremarkable esophagus. No pathologically enlarged axillary, mediastinal or hilar lymph nodes. Lungs/Pleura: No pneumothorax. Small right greater than left layering pleural effusions, slightly decreased bilaterally from prior CT. Dense focus of consolidation in the posterior basilar right lower lobe with surrounding ground-glass opacity, without significant volume loss, new from 06/04/2023 chest CT. No discrete pulmonary nodules. Musculoskeletal: No aggressive appearing focal osseous lesions. Mild thoracic spondylosis. Review of the MIP images confirms the above findings. CTA ABDOMEN AND PELVIS FINDINGS VASCULAR Aorta: Normal caliber aorta without aneurysm, dissection, vasculitis or significant stenosis. Celiac: Patent without evidence of aneurysm, dissection, vasculitis or significant stenosis. SMA: Patent without evidence of aneurysm, dissection, vasculitis or significant stenosis. Renals: Both renal arteries are patent without evidence of aneurysm, dissection, vasculitis, fibromuscular dysplasia or significant stenosis. IMA: Patent without evidence of aneurysm, dissection, vasculitis or significant stenosis. Inflow: Patent without evidence of aneurysm, dissection, vasculitis or significant stenosis. Veins: No obvious venous abnormality within the limitations of this arterial phase study. Review of the MIP images confirms the  above findings. NON-VASCULAR Hepatobiliary: Normal liver size. Hypodense 1.8 cm segment 5 right liver lesion with faint peripheral foci of enhancement (series 6/image 412), stable from 05/08/2023 CT, favoring a hemangioma. Similar subcentimeter adjacent hypodense right liver lesion (series 6/image 423), too small to characterize, also stable. No new liver lesions. Cholecystectomy. Bile ducts are stable and within normal post cholecystectomy limits with CBD diameter 6 mm. Pancreas: Normal, with no mass or duct dilation. Spleen: Normal size. No mass. Adrenals/Urinary Tract: Normal adrenals. No hydronephrosis. Subcentimeter hypodense posterior upper left renal cortical lesion is too small to characterize and unchanged. No additional contour deforming renal lesions. New moderate diffuse bladder wall thickening. Stomach/Bowel: Normal non-distended stomach. Normal caliber small bowel with no small bowel wall thickening. Normal appendix. Marked left colonic diverticulosis with no large bowel wall thickening or significant pericolonic fat stranding. Vascular/Lymphatic: No pathologically enlarged lymph nodes in the abdomen or pelvis. Reproductive: Mild prostatomegaly. Other: No pneumoperitoneum. Trace ascites. No focal fluid collection. Minimal anasarca. Musculoskeletal: No aggressive appearing focal osseous lesions. Mild-to-moderate lumbar spondylosis. Review of the MIP images confirms the above findings. IMPRESSION: 1. Dense focus of consolidation in the posterior basilar right lower lobe with surrounding ground-glass opacity, without significant volume loss, new from 06/04/2023 chest CT, compatible  with pneumonia. 2. Diffuse aortic valve calcification and thickening, typically correlating with aortic stenosis. 3. Small right greater than left layering pleural effusions. 4. Ascending thoracic aortic 5.1 cm aneurysm. Ascending thoracic aortic aneurysm. Recommend semi-annual imaging followup by CTA or MRA and referral to  cardiothoracic surgery if not already obtained. This recommendation follows 2010 ACCF/AHA/AATS/ACR/ASA/SCA/SCAI/SIR/STS/SVM Guidelines for the Diagnosis and Management of Patients With Thoracic Aortic Disease. Circulation. 2010; 121: M841-L244. Aortic aneurysm NOS (ICD10-I71.9). 5. Moderate cardiomegaly. Dilated main pulmonary artery, suggesting pulmonary arterial hypertension. 6. Trace ascites. Minimal anasarca. 7. New moderate diffuse bladder wall thickening, suggesting cystitis. Suggest correlation with urinalysis. 8. Marked left colonic diverticulosis. 9. Mild prostatomegaly. 10.  Aortic Atherosclerosis (ICD10-I70.0). Electronically Signed   By: Delbert Phenix M.D.   On: 07/26/2023 13:23   CT Angio Abd/Pel w/ and/or w/o Result Date: 07/26/2023 CLINICAL DATA:  Aortic valve replacement (TAVR), pre-op eval. Inpatient. EXAM: CT ANGIOGRAPHY CHEST, ABDOMEN AND PELVIS TECHNIQUE: Multidetector CT imaging through the chest, abdomen and pelvis was performed using the standard protocol during bolus administration of intravenous contrast. Multiplanar reconstructed images and MIPs were obtained and reviewed to evaluate the vascular anatomy. RADIATION DOSE REDUCTION: This exam was performed according to the departmental dose-optimization program which includes automated exposure control, adjustment of the mA and/or kV according to patient size and/or use of iterative reconstruction technique. CONTRAST:  95mL OMNIPAQUE IOHEXOL 350 MG/ML SOLN COMPARISON:  06/03/2013 chest CT angiogram and 05/08/2023 CT abdomen/pelvis. FINDINGS: CTA CHEST FINDINGS Cardiovascular: Moderate cardiomegaly, similar. Diffuse thickening and coarse calcification of the aortic valve usually correlating with aortic stenosis. Right internal jugular Swan-Ganz catheter with tip in the distal right pulmonary artery with expected surrounding soft tissue gas in the right neck. Right PICC terminates in the lower third of the SVC. No significant pericardial  effusion/thickening. Left circumflex coronary atherosclerosis. Atherosclerotic thoracic aorta with 5.1 cm ascending thoracic aortic aneurysm. Descending thoracic aortic diameter 3.7 cm. Aortic diameter 2.9 cm at the diaphragmatic hiatus. No thoracic aortic dissection or penetrating atherosclerotic ulcer. Patent aortic arch branch vessels. Dilated main pulmonary artery (3.7 cm diameter). No central pulmonary emboli. Mediastinum/Nodes: No significant thyroid nodules. Unremarkable esophagus. No pathologically enlarged axillary, mediastinal or hilar lymph nodes. Lungs/Pleura: No pneumothorax. Small right greater than left layering pleural effusions, slightly decreased bilaterally from prior CT. Dense focus of consolidation in the posterior basilar right lower lobe with surrounding ground-glass opacity, without significant volume loss, new from 06/04/2023 chest CT. No discrete pulmonary nodules. Musculoskeletal: No aggressive appearing focal osseous lesions. Mild thoracic spondylosis. Review of the MIP images confirms the above findings. CTA ABDOMEN AND PELVIS FINDINGS VASCULAR Aorta: Normal caliber aorta without aneurysm, dissection, vasculitis or significant stenosis. Celiac: Patent without evidence of aneurysm, dissection, vasculitis or significant stenosis. SMA: Patent without evidence of aneurysm, dissection, vasculitis or significant stenosis. Renals: Both renal arteries are patent without evidence of aneurysm, dissection, vasculitis, fibromuscular dysplasia or significant stenosis. IMA: Patent without evidence of aneurysm, dissection, vasculitis or significant stenosis. Inflow: Patent without evidence of aneurysm, dissection, vasculitis or significant stenosis. Veins: No obvious venous abnormality within the limitations of this arterial phase study. Review of the MIP images confirms the above findings. NON-VASCULAR Hepatobiliary: Normal liver size. Hypodense 1.8 cm segment 5 right liver lesion with faint  peripheral foci of enhancement (series 6/image 412), stable from 05/08/2023 CT, favoring a hemangioma. Similar subcentimeter adjacent hypodense right liver lesion (series 6/image 423), too small to characterize, also stable. No new liver lesions. Cholecystectomy. Bile ducts are stable and within normal post  cholecystectomy limits with CBD diameter 6 mm. Pancreas: Normal, with no mass or duct dilation. Spleen: Normal size. No mass. Adrenals/Urinary Tract: Normal adrenals. No hydronephrosis. Subcentimeter hypodense posterior upper left renal cortical lesion is too small to characterize and unchanged. No additional contour deforming renal lesions. New moderate diffuse bladder wall thickening. Stomach/Bowel: Normal non-distended stomach. Normal caliber small bowel with no small bowel wall thickening. Normal appendix. Marked left colonic diverticulosis with no large bowel wall thickening or significant pericolonic fat stranding. Vascular/Lymphatic: No pathologically enlarged lymph nodes in the abdomen or pelvis. Reproductive: Mild prostatomegaly. Other: No pneumoperitoneum. Trace ascites. No focal fluid collection. Minimal anasarca. Musculoskeletal: No aggressive appearing focal osseous lesions. Mild-to-moderate lumbar spondylosis. Review of the MIP images confirms the above findings. IMPRESSION: 1. Dense focus of consolidation in the posterior basilar right lower lobe with surrounding ground-glass opacity, without significant volume loss, new from 06/04/2023 chest CT, compatible with pneumonia. 2. Diffuse aortic valve calcification and thickening, typically correlating with aortic stenosis. 3. Small right greater than left layering pleural effusions. 4. Ascending thoracic aortic 5.1 cm aneurysm. Ascending thoracic aortic aneurysm. Recommend semi-annual imaging followup by CTA or MRA and referral to cardiothoracic surgery if not already obtained. This recommendation follows 2010 ACCF/AHA/AATS/ACR/ASA/SCA/SCAI/SIR/STS/SVM  Guidelines for the Diagnosis and Management of Patients With Thoracic Aortic Disease. Circulation. 2010; 121: Z610-R604. Aortic aneurysm NOS (ICD10-I71.9). 5. Moderate cardiomegaly. Dilated main pulmonary artery, suggesting pulmonary arterial hypertension. 6. Trace ascites. Minimal anasarca. 7. New moderate diffuse bladder wall thickening, suggesting cystitis. Suggest correlation with urinalysis. 8. Marked left colonic diverticulosis. 9. Mild prostatomegaly. 10.  Aortic Atherosclerosis (ICD10-I70.0). Electronically Signed   By: Delbert Phenix M.D.   On: 07/26/2023 13:23   CT CORONARY MORPH W/CTA COR W/SCORE W/CA W/CM &/OR WO/CM Addendum Date: 07/25/2023 ADDENDUM REPORT: 07/25/2023 17:01 EXAM: OVER-READ INTERPRETATION  CT CHEST The following report is an over-read performed by radiologist Dr. Knute Neu [facility] on 07/25/2023. This over-read does not include interpretation of cardiac or coronary anatomy or pathology. The cardiac CTA interpretation by the cardiologist is attached. COMPARISON:  Chest radiograph dated 07/23/2023, CTA chest dated 06/04/2023 FINDINGS: Cardiovascular: Partially imaged PA catheter tip terminates in the distal right main pulmonary artery. Aortic atherosclerosis. Mediastinum/Nodes: Normal esophagus. No pathologically enlarged mediastinal or hilar lymph nodes. Lungs/Pleura: The imaged central airways are patent. Mild diffuse bronchial wall thickening with bilateral lower lobe subsegmental atelectasis. Right lower lobe atelectasis distal to the subsegmental mucous plugging with adjacent ground-glass opacities. Basilar left lower lobe subsegmental atelectasis. Medial left lower lobe subsolid nodule measures 12 x 6 mm (5:73). No pneumothorax. Large right pleural effusion. Trace left pleural effusion. Upper abdomen: Normal. Musculoskeletal: No acute or abnormal lytic or blastic osseous lesions. IMPRESSION: 1. Right lower lobe ground-glass opacities, likely pulmonary edema. 2. Large  right pleural effusion. Trace left pleural effusion. 3. Bilateral lower lobe subsegmental atelectasis distal to subsegmental mucous plugging. 4. Medial left lower lobe subsolid nodule measures 12 x 6 mm. Initial follow-up with CT at 6 months is recommended to confirm persistence. If persistent, repeat CT is recommended every 2 years until 5 years of stability has been established. This recommendation follows the consensus statement: Guidelines for Management of Incidental Pulmonary Nodules Detected on CT Images: From the Fleischner Society 2017; Radiology 2017; 284:228-243. 5.  Aortic Atherosclerosis (ICD10-I70.0). Electronically Signed   By: Agustin Cree M.D.   On: 07/25/2023 17:01   Result Date: 07/25/2023 CLINICAL DATA:  Aortic Valve pathology with assessment for TAVR EXAM: Cardiac TAVR CT TECHNIQUE: The  patient was scanned on a [employer] 192 slice scanner. A 120 kV retrospective scan was triggered in the descending thoracic aorta at 111 HU's. Gantry rotation speed was 270 msecs and collimation was .9 mm. No beta blockade or nitro were given. The 3D data set was reconstructed in 5% intervals of the R-R cycle. Systolic and diastolic phases were analyzed on a dedicated work station using MPR, MIP and VRT modes. The patient received 95 cc of contrast. FINDINGS: Aortic Valve: Severely thickened aortic valve with calcification and reduced excursion the planimeter valve area is 0.942 Sq cm consistent with severe aortic stenosis Morphology: Three cusp, bicuspid valve but with only one normal commissure (left-non). 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 Annular calcification: Moderate- two mild calcifications. Aortic Valve Calcium Score: 6119 Perimembranous septal diameter: 4 mm Mitral Valve: No calcifications Aortic Annulus Measurements Major annulus diameter: 33 mm Minor annulus diameter: 30 mm Annular perimeter: 98 mm Annular area: 7.51 cm2 Aortic Measurements Sinotubular Junction: 37 mm Ascending Thoracic Aorta: 52 mm  Aortic Arch: 35 mm Descending Thoracic Aorta: 30 mm Sinus of Valsalva Measurements: Right coronary cusp width: 39 mm Left coronary cusp width: 39 mm Non coronary cusp width: 44 mm Coronary Artery Height above Annulus: Left Main: 18 mm Left SoV height: 29 mm Right Coronary: 25 mm Right SoV height: 29 mm Optimum Fluoroscopic Angle for Delivery: LAO 19, CAU 14 Cusp overlay view angle: RAO 0, CAU 35 Valves for structural team consideration: Heart team discussion recommended (scene saved) Sapien not recommended Evolut 34 mm valve is undersized for this annulus. Non TAVR Valve Findings: Coronary Arteries: Normal coronary origin. Study not completed with nitroglycerin. Coronary Calcium Score: 7 (LCX) Percentile: 23rd for age, sex, and race matched control. Systemic veins: Normal anatomy. Main Pulmonary artery: Severe dilation 38 mm; catheter noted Pulmonary veins: Right middle pulmonary vein noted Left atrial appendage: patent Interatrial septum: No communications Chamber dimensions: Biatrial dilation, biventricular dilation Pericardium: No calcification Extra Cardiac Findings as per separate reporting. Notable artifacts: Cardiac motion Image quality: Poor IMPRESSION: 1. Severe Aortic stenosis. Concerns that this anatomy is not suitable for TAVR. Heart team discussion recommended. 2.  Large thoracic aortic aneurysm RECOMMENDATIONS: The proposed cut-off value of 1,651 AU yielded a 93 % sensitivity and 75 % specificity in grading AS severity in patients with classical low-flow, low-gradient AS. Proposed different cut-off values to define severe AS for men and women as 2,065 AU and 1,274 AU, respectively. The joint European and American recommendations for the assessment of AS consider the aortic valve calcium score as a continuum - a very high calcium score suggests severe AS and a low calcium score suggests severe AS is unlikely. Sunday Shams, et al. 2017 ESC/EACTS Guidelines for the management of valvular  heart disease. Eur Heart J [phone] Coronary artery calcium (CAC) score is a strong predictor of incident coronary heart disease (CHD) and provides predictive information beyond traditional risk factors. CAC scoring is reasonable to use in the decision to withhold, postpone, or initiate statin therapy in intermediate-risk or selected borderline-risk asymptomatic adults (age 42-75 years and LDL-C >=70 to <190 mg/dL) who do not have diabetes or established atherosclerotic cardiovascular disease (ASCVD).* In intermediate-risk (10-year ASCVD risk >=7.5% to <20%) adults or selected borderline-risk (10-year ASCVD risk >=5% to <7.5%) adults in whom a CAC score is measured for the purpose of making a treatment decision the following recommendations have been made: If CAC = 0, it is reasonable to withhold statin therapy and reassess in  5 to 10 years, as long as higher risk conditions are absent (diabetes mellitus, family history of premature CHD in first degree relatives (males <55 years; females <65 years), cigarette smoking, LDL >=190 mg/dL or other independent risk factors). If CAC is 1 to 99, it is reasonable to initiate statin therapy for patients >=58 years of age. If CAC is >=100 or >=75th percentile, it is reasonable to initiate statin therapy at any age. Cardiology referral should be considered for patients with CAC scores >=400 or >=75th percentile. *2018 AHA/ACC/AACVPR/AAPA/ABC/ACPM/ADA/AGS/APhA/ASPC/NLA/PCNA Guideline on the Management of Blood Cholesterol: A Report of the American College of Cardiology/American Heart Association Task Force on Clinical Practice Guidelines. J Am Coll Cardiol. 2019;73(24):3168-3209. Mahesh  Chandrasekhar Electronically Signed: By: Riley Lam M.D. On: 07/25/2023 16:02   DG CHEST PORT 1 VIEW Result Date: 07/23/2023 CLINICAL DATA:  Intra-aortic balloon pump assist. EXAM: PORTABLE CHEST 1 VIEW COMPARISON:  07/20/2023 FINDINGS: Stable enlarged cardiac silhouette  and tortuous aorta. Increased right pleural fluid with posterior layering today. Mild increase in prominence of the pulmonary vasculature and interstitial markings right jugular Swan-Ganz catheter tip in the distal right intersegmental pulmonary artery. Intra-aortic balloon pump marker in the distal aortic arch/proximal descending aorta. No pneumothorax. Unremarkable bones. IMPRESSION: 1. Increased right pleural fluid with posterior layering today. 2. Interval mild pulmonary venous congestion and interstitial edema. 3. Stable cardiomegaly. 4. Support devices, as described above. Electronically Signed   By: Beckie Salts M.D.   On: 07/23/2023 09:54     STS Risk Calculator: Procedure Type: Isolated AVR Perioperative OutcomeEstimate % Operative Mortality7.04% Morbidity & Mortality40.5% Stroke1.32% Renal Failure2.32% Reoperation5.47% Prolonged Ventilation32.7% Deep Sternal Wound Infection0.033% [facility] Stay (>14 days)19.4% [facility] Stay (<6 days)*13.9%   ____________________________     [facility] Cardiomyopathy Questionnaire      07/23/2023    1:41 PM  KCCQ-12  1 a. Ability to shower/bathe Extremely limited  1 b. Ability to walk 1 block Slightly limited  1 c. Ability to hurry/jog Other, Did not do  2. Edema feet/ankles/legs Every morning  3. Limited by fatigue 3+ times per week, not every day  4. Limited by dyspnea All of the time  5. Sitting up / on 3+ pillows Never over the past 2 weeks  6. Limited enjoyment of life Limited quite a bit  7. Rest of life w/ symptoms Mostly dissatisfied  8 a. Participation in hobbies Limited quite a bit  8 b. Participation in chores Limited quite a bit  8 c. Visiting family/friends N/A, did not do for other reasons      _________________________________  Pre Surgical Assessment: 5 M Walk Test  62M=16.33ft  5 Meter Walk Test- trial 1: 10 seconds 5 Meter Walk Test- trial 2: 11.1 seconds 5 Meter Walk Test- trial 3: 11.6 seconds 5  Meter Walk Test Average: 10.9 seconds   Assessment and Plan:   [name] Daniel is a 68 y.o. male with symptoms of severe stage D2 aortic stenosis with NYHA Class IV symptoms currently admitted with acute systolic CHF with cardiogenic shock requiring dobutamine and mechanical support with an IAPB. IAPB now removed and weaned down to DBA 2.65mcg.   Echo 06/04/23 showed EF 20%, mild LVH, mild RV dysfunction, mild-mod pericardial effusion near the right atrium, and severe LFLG AS with mean gradient 29 mm hg (34mm hg by Pedhoff), AVA 0.49cm2, DVI 0.16.    CT angio chest 06/04/23 was negative for PE but showed pulmonary edema and a TAA measuring 5cm.  [facility] 07/22/23 which showed non obstructive CAD (aneurysmal ascending aorta/root made coronary engagement difficult), normal filling pressures after diuresis, low CO by Fick and markedly low by thermodilution. IABP placed with low CO on milrinone 0.375. He was switched from milrinone to dobutamine.   Cardiac gated CTA of the heart revealed a functionally unicuspid valve. It is a three cusp, bicuspid valve but with only one normal commissure (left-non). He technically sizes outside the IFU for a TAVR valve. There is a possibility that he could be treated with a 29 mm Edwards Sapien S3UR with 5cc extra volume place 80/20 aortic. He does have adequate vasculature for a TF approach. Cardiac CT reported a left pulmonary nodule and large right pleural effusion. CTA also reported a 5.1cm TAA, dense focus of consolidation in the posterior basilar right lower lobe with surrounding ground-glass opacity, without significant volume loss, new from 06/04/2023 chest CT, compatible with pneumonia and new moderate diffuse bladder wall thickening, suggesting cystitis.   I have reviewed the natural history of aortic stenosis with the patient. We have discussed the limitations of medical therapy and the poor prognosis associated with symptomatic aortic stenosis. We have reviewed  potential treatment options, including palliative medical therapy, conventional surgical aortic valve replacement, and transcatheter aortic valve replacement. We discussed treatment options in the context of this patient's specific comorbid medical conditions.    The patient's predicted risk of mortality with conventional aortic valve replacement is 7.04% primarily based on cardiogenic shock, severe LV dysfunction, CKD stage III, thrombocytopenia, and low BMI. Other significant comorbid conditions include RV dysfunction, limited social support, medical non compliance and previous alcohol abuse (although reports not drinking in several months). The patient ideally would get surgical AVR with concomitant ascending aorta repair, but the patient would not survive this with his biventricular failure and cardiogenic shock. He also has limited social support.   He is posted for inpatient TAVR-TF next Tuesday with Dr. Excell Seltzer and Dr. Laneta Simmers. He is not a bailout candidate. CTA showed a right pneumonia and Will check a UA given CT findings and discuss PNA with primary team.  Dr. Leafy Ro to follow.    For questions or updates, please contact [facility] Please consult [email or web address] for contact info under    Signed, Cline Crock, PA-C  07/26/2023 2:15 PM  Agree with the above Pt is in difficult situation with very poor LV function secondary to severe AS (bicuspid) with associated ascending aortic aneurysm. Pt has been evaluated by Dr Laneta Simmers last week and was turned down as a surgical candidate. He however is on dobutamine and only opportunity for survival would be TAVR. However this is at increased risk and will require the use of the Sapien valve with increased volume to provide success. He may require mechanical support to have the valve implanted but is not a surgical candidate for open replacement. This was discussed with the patient and he appears to understand

## 2023-07-26 NOTE — Progress Notes (Addendum)
Patient ID: [name] Daniel, male   DOB: March 31, 1956, 68 y.o.   MRN: [id number]     Advanced Heart Failure Rounding Note  Cardiologist: Dina Rich, MD  Chief Complaint: Cardiogenic Shock. Subjective:   2/17 Transferred ICU from cath lab. LHC nonobstructive CAD. IABP placed and continued on milrinone. Due to low CO/CI switched to DBA. CT Surgery/Structural Team consulted for aortic stenosis. 2/19: IABP removed.   On DBA 4 mcg . CO-OX 60%.  PAP: (34-59)/(20-37) 34/21 CVP:  [0 mmHg-12 mmHg] 1 mmHg CO:  [3.5 L/min-5.4 L/min] 5.4 L/min CI:  [1.9 L/min/m2-2.8 L/min/m2] 2.8 L/min/m2 PAPi: 4.3  Sitting up in chair. No complaints this morning. Minimally interactive.   Objective:   Weight Range: 63.3 kg Body mass index is 18.41 kg/m.   Vital Signs:   Temp:  [98.2 F (36.8 C)-99.3 F (37.4 C)] 98.6 F (37 C) (02/21 0700) Pulse Rate:  [101-119] 110 (02/21 0700) Resp:  [0-30] 23 (02/21 0700) BP: (91-120)/(77-100) 102/87 (02/21 0700) SpO2:  [87 %-98 %] 94 % (02/21 0700) Weight:  [63.3 kg] 63.3 kg (02/21 0500) Last BM Date : 07/21/23  Weight change: Filed Weights   07/24/23 2300 07/25/23 0316 07/26/23 0500  Weight: 62.3 kg 62.3 kg 63.3 kg   Intake/Output:   Intake/Output Summary (Last 24 hours) at 07/26/2023 0726 Last data filed at 07/26/2023 0700 Gross per 24 hour  Intake 1282.69 ml  Output 1200 ml  Net 82.69 ml    Physical Exam  General:  frail appearing.  No respiratory difficulty Neck: JVD flat. RIJ Swann Cor: PMI nondisplaced. Regular rate & rhythm. No rubs, gallops. 2/6 RUSB AS murmur Lungs: clear Extremities: no cyanosis, clubbing, rash, edema. Warm to touch. PICC RUE Neuro: A&O x3. Flat affect Telemetry  ST low 100s, 6 beat NSVT (Personally reviewed)   Labs    CBC Recent Labs    07/25/23 0500 07/26/23 0421  WBC 6.0 5.3  NEUTROABS 4.0 3.1  HGB 13.8 12.9*  HCT 40.6 38.2*  MCV 92.9 93.6  PLT 81* 94*   Basic Metabolic Panel Recent Labs     07/25/23 0500 07/26/23 0421  NA 130* 131*  K 4.7 4.6  CL 92* 94*  CO2 29 27  GLUCOSE 102* 109*  BUN 24* 26*  CREATININE 1.33* 1.02  CALCIUM 8.8* 8.8*  MG 2.3 2.2   Liver Function Tests No results for input(s): "AST", "ALT", "ALKPHOS", "BILITOT", "PROT", "ALBUMIN" in the last 72 hours.  No results for input(s): "LIPASE", "AMYLASE" in the last 72 hours. Cardiac Enzymes No results for input(s): "CKTOTAL", "CKMB", "CKMBINDEX", "TROPONINI" in the last 72 hours.  BNP: BNP (last 3 results) Recent Labs    06/03/23 2152 07/18/23 1609  BNP 3,234.0* >4,500.0*    ProBNP (last 3 results) No results for input(s): "PROBNP" in the last 8760 hours.   D-Dimer No results for input(s): "DDIMER" in the last 72 hours. Hemoglobin A1C No results for input(s): "HGBA1C" in the last 72 hours.  Fasting Lipid Panel No results for input(s): "CHOL", "HDL", "LDLCALC", "TRIG", "CHOLHDL", "LDLDIRECT" in the last 72 hours. Thyroid Function Tests No results for input(s): "TSH", "T4TOTAL", "T3FREE", "THYROIDAB" in the last 72 hours.  Invalid input(s): "FREET3"   Other results:   Imaging    CT CORONARY MORPH W/CTA COR W/SCORE W/CA W/CM &/OR WO/CM Addendum Date: 07/25/2023 ADDENDUM REPORT: 07/25/2023 17:01 EXAM: OVER-READ INTERPRETATION  CT CHEST The following report is an over-read performed by radiologist Dr. Knute Neu [facility] on 07/25/2023. This over-read  does not include interpretation of cardiac or coronary anatomy or pathology. The cardiac CTA interpretation by the cardiologist is attached. COMPARISON:  Chest radiograph dated 07/23/2023, CTA chest dated 06/04/2023 FINDINGS: Cardiovascular: Partially imaged PA catheter tip terminates in the distal right main pulmonary artery. Aortic atherosclerosis. Mediastinum/Nodes: Normal esophagus. No pathologically enlarged mediastinal or hilar lymph nodes. Lungs/Pleura: The imaged central airways are patent. Mild diffuse bronchial wall  thickening with bilateral lower lobe subsegmental atelectasis. Right lower lobe atelectasis distal to the subsegmental mucous plugging with adjacent ground-glass opacities. Basilar left lower lobe subsegmental atelectasis. Medial left lower lobe subsolid nodule measures 12 x 6 mm (5:73). No pneumothorax. Large right pleural effusion. Trace left pleural effusion. Upper abdomen: Normal. Musculoskeletal: No acute or abnormal lytic or blastic osseous lesions. IMPRESSION: 1. Right lower lobe ground-glass opacities, likely pulmonary edema. 2. Large right pleural effusion. Trace left pleural effusion. 3. Bilateral lower lobe subsegmental atelectasis distal to subsegmental mucous plugging. 4. Medial left lower lobe subsolid nodule measures 12 x 6 mm. Initial follow-up with CT at 6 months is recommended to confirm persistence. If persistent, repeat CT is recommended every 2 years until 5 years of stability has been established. This recommendation follows the consensus statement: Guidelines for Management of Incidental Pulmonary Nodules Detected on CT Images: From the Fleischner Society 2017; Radiology 2017; 284:228-243. 5.  Aortic Atherosclerosis (ICD10-I70.0). Electronically Signed   By: Agustin Cree M.D.   On: 07/25/2023 17:01   Result Date: 07/25/2023 CLINICAL DATA:  Aortic Valve pathology with assessment for TAVR EXAM: Cardiac TAVR CT TECHNIQUE: The patient was scanned on a Siemens Force 192 slice scanner. A 120 kV retrospective scan was triggered in the descending thoracic aorta at 111 HU's. Gantry rotation speed was 270 msecs and collimation was .9 mm. No beta blockade or nitro were given. The 3D data set was reconstructed in 5% intervals of the R-R cycle. Systolic and diastolic phases were analyzed on a dedicated work station using MPR, MIP and VRT modes. The patient received 95 cc of contrast. FINDINGS: Aortic Valve: Severely thickened aortic valve with calcification and reduced excursion the planimeter valve area is  0.942 Sq cm consistent with severe aortic stenosis Morphology: Three cusp, bicuspid valve but with only one normal commissure (left-non). 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 Annular calcification: Moderate- two mild calcifications. Aortic Valve Calcium Score: 6119 Perimembranous septal diameter: 4 mm Mitral Valve: No calcifications Aortic Annulus Measurements Major annulus diameter: 33 mm Minor annulus diameter: 30 mm Annular perimeter: 98 mm Annular area: 7.51 cm2 Aortic Measurements Sinotubular Junction: 37 mm Ascending Thoracic Aorta: 52 mm Aortic Arch: 35 mm Descending Thoracic Aorta: 30 mm Sinus of Valsalva Measurements: Right coronary cusp width: 39 mm Left coronary cusp width: 39 mm Non coronary cusp width: 44 mm Coronary Artery Height above Annulus: Left Main: 18 mm Left SoV height: 29 mm Right Coronary: 25 mm Right SoV height: 29 mm Optimum Fluoroscopic Angle for Delivery: LAO 19, CAU 14 Cusp overlay view angle: RAO 0, CAU 35 Valves for structural team consideration: Heart team discussion recommended (scene saved) Sapien not recommended Evolut 34 mm valve is undersized for this annulus. Non TAVR Valve Findings: Coronary Arteries: Normal coronary origin. Study not completed with nitroglycerin. Coronary Calcium Score: 7 (LCX) Percentile: 23rd for age, sex, and race matched control. Systemic veins: Normal anatomy. Main Pulmonary artery: Severe dilation 38 mm; catheter noted Pulmonary veins: Right middle pulmonary vein noted Left atrial appendage: patent Interatrial septum: No communications Chamber dimensions: Biatrial dilation, biventricular dilation Pericardium: No  calcification Extra Cardiac Findings as per separate reporting. Notable artifacts: Cardiac motion Image quality: Poor IMPRESSION: 1. Severe Aortic stenosis. Concerns that this anatomy is not suitable for TAVR. Heart team discussion recommended. 2.  Large thoracic aortic aneurysm RECOMMENDATIONS: The proposed cut-off value of 1,651 AU yielded a 93 %  sensitivity and 75 % specificity in grading AS severity in patients with classical low-flow, low-gradient AS. Proposed different cut-off values to define severe AS for men and women as 2,065 AU and 1,274 AU, respectively. The joint European and American recommendations for the assessment of AS consider the aortic valve calcium score as a continuum - a very high calcium score suggests severe AS and a low calcium score suggests severe AS is unlikely. Sunday Shams, et al. 2017 ESC/EACTS Guidelines for the management of valvular heart disease. Eur Heart J [phone] Coronary artery calcium (CAC) score is a strong predictor of incident coronary heart disease (CHD) and provides predictive information beyond traditional risk factors. CAC scoring is reasonable to use in the decision to withhold, postpone, or initiate statin therapy in intermediate-risk or selected borderline-risk asymptomatic adults (age 53-75 years and LDL-C >=70 to <190 mg/dL) who do not have diabetes or established atherosclerotic cardiovascular disease (ASCVD).* In intermediate-risk (10-year ASCVD risk >=7.5% to <20%) adults or selected borderline-risk (10-year ASCVD risk >=5% to <7.5%) adults in whom a CAC score is measured for the purpose of making a treatment decision the following recommendations have been made: If CAC = 0, it is reasonable to withhold statin therapy and reassess in 5 to 10 years, as long as higher risk conditions are absent (diabetes mellitus, family history of premature CHD in first degree relatives (males <55 years; females <65 years), cigarette smoking, LDL >=190 mg/dL or other independent risk factors). If CAC is 1 to 99, it is reasonable to initiate statin therapy for patients >=35 years of age. If CAC is >=100 or >=75th percentile, it is reasonable to initiate statin therapy at any age. Cardiology referral should be considered for patients with CAC scores >=400 or >=75th percentile. *2018  AHA/ACC/AACVPR/AAPA/ABC/ACPM/ADA/AGS/APhA/ASPC/NLA/PCNA Guideline on the Management of Blood Cholesterol: A Report of the American College of Cardiology/American Heart Association Task Force on Clinical Practice Guidelines. J Am Coll Cardiol. 2019;73(24):3168-3209. Mahesh  Chandrasekhar Electronically Signed: By: Riley Lam M.D. On: 07/25/2023 16:02     Medications:   Scheduled Medications:  aspirin EC  81 mg Oral Daily   Chlorhexidine Gluconate Cloth  6 each Topical Daily   cyanocobalamin  1,000 mcg Oral Daily   digoxin  0.125 mg Oral Daily   enoxaparin (LOVENOX) injection  40 mg Subcutaneous Q24H   feeding supplement  237 mL Oral TID BM   folic acid  1 mg Oral Daily   multivitamin with minerals  1 tablet Oral Daily   rosuvastatin  20 mg Oral Daily   sodium chloride flush  10-40 mL Intracatheter Q12H   sodium chloride flush  3 mL Intravenous Q12H   spironolactone  25 mg Oral Daily   thiamine  100 mg Oral Daily    Infusions:  sodium chloride 10 mL/hr at 07/26/23 0700   DOBUTamine 4 mcg/kg/min (07/26/23 0700)    PRN Medications: sodium chloride, acetaminophen, magnesium hydroxide, ondansetron (ZOFRAN) IV, mouth rinse, oxyCODONE, sodium chloride flush, sodium chloride flush, traMADol, zolpidem  Assessment/Plan  1. 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  He has severe aortic stenosis.  He also has anterior Qs on ECG so prior MI is certainly possible. He was re-admitted with marked volume overload and low cardiac output, co-ox 38%.  He was started on milrinone which was increased to 0.375 and diuresed with lasix drip. - 2/17 cath - IABP placed and continued on Milrinone. Due to low CO/CI he was switched to Dobutamine. 2/19 IABP removed. Hemodynamics stable. CO-OX 60%.  - Remains tachycardic.  Cut back dobutamine to 2.5 mcg. Repeat co-ox in 1 hour - Continue spiro 25 mg daily.  - Continue digoxin 0.125 daily. Dig level 0.5 yesterday  2. Elevated troponin: Mild elevation with no trend. Suspect demand ischemia with volume overload.  - LHC with nonobstructive CAD - Continue statin - Continue ASA 81  3. Aortic stenosis: Low flow/low gradient severe AS on 12/24 echo. This contributes to CHF.  He will ultimately need the aortic valve replaced.  - CT Surgery/Structural Team following. - Considering TAVR. TAVR CTs done 07/25/23 to further evaluated candidacy for TAVR. - Will follow up with structural team today  4. Thrombocytopenia: Platelets have been mildly low since 12/24.  Follow.   5. Ascending aortic aneurysm: 5.0 cm on 12/24 CTA chest.   6. H/o ETOH abuse: Not very forthcoming on current drinking habits but apparently has quit for about 6 months.   7. Deconditioning - Had difficulty standing. PT has been consulted  8. Hyponatremia - Restrict free water.  - 131 today  SDOH- Difficult obtaining information from him.   CRITICAL CARE Performed by: Alen Bleacher AGACNP-BC    Total critical care time:  15 minutes   Critical care time was exclusive of separately billable procedures and treating other patients.   Critical care was necessary to treat or prevent imminent or life-threatening deterioration.   Critical care was time spent personally by me on the following activities: development of treatment plan with patient and/or surrogate as well as nursing, discussions with consultants, evaluation of patient's response to treatment, examination of patient, obtaining history from patient or surrogate, ordering and performing treatments and interventions, ordering and review of laboratory studies, ordering and review of radiographic studies, pulse oximetry and re-evaluation of patient's condition.   Length of Stay: 8  Alen Bleacher, NP  07/26/2023, 7:26 AM  Advanced Heart  Failure Team Pager [phone] (M-F; 7a - 5p)   Agree with above. Remains on DBA 4. Swan numbers reviewed personally. Volume status low. Output ok.   Leg cramps have improved. Denies CP or SOB.   A bit more interactive today. Says he wants to pursue TAVR if he is a candidate.  General:  Weak appearing. Cachetic No resp difficulty HEENT: normal Neck: supple.RIJ swan  Carotids 2+ bilat; no bruits. No lymphadenopathy or thryomegaly appreciated. Cor: PMI nondisplaced. 3/6 AS Lungs: crackles at bases Abdomen: soft, nontender, nondistended. No hepatosplenomegaly. No bruits or masses. Good bowel sounds. Extremities: no cyanosis, clubbing, rash, edema Neuro: alert & orientedx3, cranial nerves grossly intact. moves all 4 extremities w/o difficulty. Affect flat  Would decrease DBA to 2.5. Plan CT today for TAVR candidacy.   I d/w Dr. Excell Seltzer several time personally. Will be very high risk for TAVR in setting of severe biventricular dysfunction. May need to consider doing on pump.   Need to mobilize him and push pulmonary toilet.   CRITICAL CARE Performed by: Arvilla Meres  Total critical care time: 36 minutes  Critical  care time was exclusive of separately billable procedures and treating other patients.  Critical care was necessary to treat or prevent imminent or life-threatening deterioration.  Critical care was time spent personally by me (independent of midlevel providers or residents) on the following activities: development of treatment plan with patient and/or surrogate as well as nursing, discussions with consultants, evaluation of patient's response to treatment, examination of patient, obtaining history from patient or surrogate, ordering and performing treatments and interventions, ordering and review of laboratory studies, ordering and review of radiographic studies, pulse oximetry and re-evaluation of patient's condition.  Arvilla Meres, MD  4:50 PM

## 2023-07-26 NOTE — H&P (View-Only) (Signed)
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 considered for patients with CAC scores >=400 or >=75th percentile.  *2018 AHA/ACC/AACVPR/AAPA/ABC/ACPM/ADA/AGS/APhA/ASPC/NLA/PCNA Guideline on the Management of Blood Cholesterol: A Report of the American College of Cardiology/American Heart Association Task Force on Clinical Practice Guidelines. J Am Coll Cardiol. 2019;73(24):3168-3209.  Mahesh  Chandrasekhar  Electronically Signed: By: Riley Lam M.D. On: 07/25/2023 16:02     ______________________________________________________________________________________________      Laboratory Data:  High Sensitivity Troponin:   Recent Labs  Lab 07/18/23 1609 07/18/23 1846  TROPONINIHS 68*  68*     Chemistry Recent Labs  Lab 07/24/23 0415 07/25/23 0500 07/26/23 0421  NA 129* 130* 131*  K 4.5 4.7 4.6  CL 90* 92* 94*  CO2 28 29 27   GLUCOSE 121* 102* 109*  BUN 25* 24* 26*  CREATININE 1.47* 1.33* 1.02  CALCIUM 8.9 8.8* 8.8*  GFRNONAA 52* 59* >60  ANIONGAP 11 9 10     No results for input(s): "PROT", "ALBUMIN", "AST", "ALT", "ALKPHOS", "BILITOT" in the last 168 hours. Hematology Recent Labs  Lab 07/24/23 0415 07/25/23 0500 07/26/23 0421  WBC 6.7 6.0 5.3  RBC 4.36 4.37 4.08*  HGB 13.6 13.8 12.9*  HCT 40.4 40.6 38.2*  MCV 92.7 92.9 93.6  MCH 31.2 31.6 31.6  MCHC 33.7 34.0 33.8  RDW 16.7* 16.5* 16.7*  PLT 82* 81* 94*   BNPNo results for input(s): "BNP", "PROBNP" in the last 168 hours.  DDimer No results for input(s): "DDIMER" in the last 168 hours.   Radiology/Studies:  CT ANGIO CHEST AORTA W/CM & OR WO/CM Result Date: 07/26/2023 CLINICAL DATA:  Aortic valve replacement (TAVR), pre-op eval. Inpatient. EXAM:  CT ANGIOGRAPHY CHEST, ABDOMEN AND PELVIS TECHNIQUE: Multidetector CT imaging through the chest, abdomen and pelvis was performed using the standard protocol during bolus administration of intravenous contrast. Multiplanar reconstructed images and MIPs were obtained and reviewed to evaluate the vascular anatomy. RADIATION DOSE REDUCTION: This exam was performed according to the departmental dose-optimization program which includes automated exposure control, adjustment of the mA and/or kV according to patient size and/or use of iterative reconstruction technique. CONTRAST:  95mL OMNIPAQUE IOHEXOL 350 MG/ML SOLN COMPARISON:  06/03/2013 chest CT angiogram and 05/08/2023 CT abdomen/pelvis. FINDINGS: CTA CHEST FINDINGS Cardiovascular: Moderate cardiomegaly, similar. Diffuse thickening and coarse calcification of the aortic valve usually correlating with aortic stenosis. Right internal jugular Swan-Ganz catheter with tip in the distal right pulmonary artery with expected  surrounding soft tissue gas in the right neck. Right PICC terminates in the lower third of the SVC. No significant pericardial effusion/thickening. Left circumflex coronary atherosclerosis. Atherosclerotic thoracic aorta with 5.1 cm ascending thoracic aortic aneurysm. Descending thoracic aortic diameter 3.7 cm. Aortic diameter 2.9 cm at the diaphragmatic hiatus. No thoracic aortic dissection or penetrating atherosclerotic ulcer. Patent aortic arch branch vessels. Dilated main pulmonary artery (3.7 cm diameter). No central pulmonary emboli. Mediastinum/Nodes: No significant thyroid nodules. Unremarkable esophagus. No pathologically enlarged axillary, mediastinal or hilar lymph nodes. Lungs/Pleura: No pneumothorax. Small right greater than left layering pleural effusions, slightly decreased bilaterally from prior CT. Dense focus of consolidation in the posterior basilar right lower lobe with surrounding ground-glass opacity, without significant volume loss, new from 06/04/2023 chest CT. No discrete pulmonary nodules. Musculoskeletal: No aggressive appearing focal osseous lesions. Mild thoracic spondylosis. Review of the MIP images confirms the above findings. CTA ABDOMEN AND PELVIS FINDINGS VASCULAR Aorta: Normal caliber aorta without aneurysm, dissection, vasculitis or significant stenosis. Celiac: Patent without evidence of aneurysm, dissection, vasculitis or significant stenosis. SMA: Patent without evidence of aneurysm, dissection, vasculitis or significant stenosis. Renals: Both renal arteries are patent without evidence of aneurysm, dissection, vasculitis, fibromuscular dysplasia or significant stenosis. IMA: Patent without evidence of aneurysm, dissection, vasculitis or significant stenosis. Inflow: Patent without evidence of aneurysm, dissection, vasculitis or significant stenosis. Veins: No obvious venous abnormality within the limitations of this arterial phase study. Review of the MIP images confirms the  above findings. NON-VASCULAR Hepatobiliary: Normal liver size. Hypodense 1.8 cm segment 5 right liver lesion with faint peripheral foci of enhancement (series 6/image 412), stable from 05/08/2023 CT, favoring a hemangioma. Similar subcentimeter adjacent hypodense right liver lesion (series 6/image 423), too small to characterize, also stable. No new liver lesions. Cholecystectomy. Bile ducts are stable and within normal post cholecystectomy limits with CBD diameter 6 mm. Pancreas: Normal, with no mass or duct dilation. Spleen: Normal size. No mass. Adrenals/Urinary Tract: Normal adrenals. No hydronephrosis. Subcentimeter hypodense posterior upper left renal cortical lesion is too small to characterize and unchanged. No additional contour deforming renal lesions. New moderate diffuse bladder wall thickening. Stomach/Bowel: Normal non-distended stomach. Normal caliber small bowel with no small bowel wall thickening. Normal appendix. Marked left colonic diverticulosis with no large bowel wall thickening or significant pericolonic fat stranding. Vascular/Lymphatic: No pathologically enlarged lymph nodes in the abdomen or pelvis. Reproductive: Mild prostatomegaly. Other: No pneumoperitoneum. Trace ascites. No focal fluid collection. Minimal anasarca. Musculoskeletal: No aggressive appearing focal osseous lesions. Mild-to-moderate lumbar spondylosis. Review of the MIP images confirms the above findings. IMPRESSION: 1. Dense focus of consolidation in the posterior basilar right lower lobe with surrounding ground-glass opacity, without significant volume loss, new from 06/04/2023 chest CT, compatible  with pneumonia. 2. Diffuse aortic valve calcification and thickening, typically correlating with aortic stenosis. 3. Small right greater than left layering pleural effusions. 4. Ascending thoracic aortic 5.1 cm aneurysm. Ascending thoracic aortic aneurysm. Recommend semi-annual imaging followup by CTA or MRA and referral to  cardiothoracic surgery if not already obtained. This recommendation follows 2010 ACCF/AHA/AATS/ACR/ASA/SCA/SCAI/SIR/STS/SVM Guidelines for the Diagnosis and Management of Patients With Thoracic Aortic Disease. Circulation. 2010; 121: M841-L244. Aortic aneurysm NOS (ICD10-I71.9). 5. Moderate cardiomegaly. Dilated main pulmonary artery, suggesting pulmonary arterial hypertension. 6. Trace ascites. Minimal anasarca. 7. New moderate diffuse bladder wall thickening, suggesting cystitis. Suggest correlation with urinalysis. 8. Marked left colonic diverticulosis. 9. Mild prostatomegaly. 10.  Aortic Atherosclerosis (ICD10-I70.0). Electronically Signed   By: Delbert Phenix M.D.   On: 07/26/2023 13:23   CT Angio Abd/Pel w/ and/or w/o Result Date: 07/26/2023 CLINICAL DATA:  Aortic valve replacement (TAVR), pre-op eval. Inpatient. EXAM: CT ANGIOGRAPHY CHEST, ABDOMEN AND PELVIS TECHNIQUE: Multidetector CT imaging through the chest, abdomen and pelvis was performed using the standard protocol during bolus administration of intravenous contrast. Multiplanar reconstructed images and MIPs were obtained and reviewed to evaluate the vascular anatomy. RADIATION DOSE REDUCTION: This exam was performed according to the departmental dose-optimization program which includes automated exposure control, adjustment of the mA and/or kV according to patient size and/or use of iterative reconstruction technique. CONTRAST:  95mL OMNIPAQUE IOHEXOL 350 MG/ML SOLN COMPARISON:  06/03/2013 chest CT angiogram and 05/08/2023 CT abdomen/pelvis. FINDINGS: CTA CHEST FINDINGS Cardiovascular: Moderate cardiomegaly, similar. Diffuse thickening and coarse calcification of the aortic valve usually correlating with aortic stenosis. Right internal jugular Swan-Ganz catheter with tip in the distal right pulmonary artery with expected surrounding soft tissue gas in the right neck. Right PICC terminates in the lower third of the SVC. No significant pericardial  effusion/thickening. Left circumflex coronary atherosclerosis. Atherosclerotic thoracic aorta with 5.1 cm ascending thoracic aortic aneurysm. Descending thoracic aortic diameter 3.7 cm. Aortic diameter 2.9 cm at the diaphragmatic hiatus. No thoracic aortic dissection or penetrating atherosclerotic ulcer. Patent aortic arch branch vessels. Dilated main pulmonary artery (3.7 cm diameter). No central pulmonary emboli. Mediastinum/Nodes: No significant thyroid nodules. Unremarkable esophagus. No pathologically enlarged axillary, mediastinal or hilar lymph nodes. Lungs/Pleura: No pneumothorax. Small right greater than left layering pleural effusions, slightly decreased bilaterally from prior CT. Dense focus of consolidation in the posterior basilar right lower lobe with surrounding ground-glass opacity, without significant volume loss, new from 06/04/2023 chest CT. No discrete pulmonary nodules. Musculoskeletal: No aggressive appearing focal osseous lesions. Mild thoracic spondylosis. Review of the MIP images confirms the above findings. CTA ABDOMEN AND PELVIS FINDINGS VASCULAR Aorta: Normal caliber aorta without aneurysm, dissection, vasculitis or significant stenosis. Celiac: Patent without evidence of aneurysm, dissection, vasculitis or significant stenosis. SMA: Patent without evidence of aneurysm, dissection, vasculitis or significant stenosis. Renals: Both renal arteries are patent without evidence of aneurysm, dissection, vasculitis, fibromuscular dysplasia or significant stenosis. IMA: Patent without evidence of aneurysm, dissection, vasculitis or significant stenosis. Inflow: Patent without evidence of aneurysm, dissection, vasculitis or significant stenosis. Veins: No obvious venous abnormality within the limitations of this arterial phase study. Review of the MIP images confirms the above findings. NON-VASCULAR Hepatobiliary: Normal liver size. Hypodense 1.8 cm segment 5 right liver lesion with faint  peripheral foci of enhancement (series 6/image 412), stable from 05/08/2023 CT, favoring a hemangioma. Similar subcentimeter adjacent hypodense right liver lesion (series 6/image 423), too small to characterize, also stable. No new liver lesions. Cholecystectomy. Bile ducts are stable and within normal post  cholecystectomy limits with CBD diameter 6 mm. Pancreas: Normal, with no mass or duct dilation. Spleen: Normal size. No mass. Adrenals/Urinary Tract: Normal adrenals. No hydronephrosis. Subcentimeter hypodense posterior upper left renal cortical lesion is too small to characterize and unchanged. No additional contour deforming renal lesions. New moderate diffuse bladder wall thickening. Stomach/Bowel: Normal non-distended stomach. Normal caliber small bowel with no small bowel wall thickening. Normal appendix. Marked left colonic diverticulosis with no large bowel wall thickening or significant pericolonic fat stranding. Vascular/Lymphatic: No pathologically enlarged lymph nodes in the abdomen or pelvis. Reproductive: Mild prostatomegaly. Other: No pneumoperitoneum. Trace ascites. No focal fluid collection. Minimal anasarca. Musculoskeletal: No aggressive appearing focal osseous lesions. Mild-to-moderate lumbar spondylosis. Review of the MIP images confirms the above findings. IMPRESSION: 1. Dense focus of consolidation in the posterior basilar right lower lobe with surrounding ground-glass opacity, without significant volume loss, new from 06/04/2023 chest CT, compatible with pneumonia. 2. Diffuse aortic valve calcification and thickening, typically correlating with aortic stenosis. 3. Small right greater than left layering pleural effusions. 4. Ascending thoracic aortic 5.1 cm aneurysm. Ascending thoracic aortic aneurysm. Recommend semi-annual imaging followup by CTA or MRA and referral to cardiothoracic surgery if not already obtained. This recommendation follows 2010 ACCF/AHA/AATS/ACR/ASA/SCA/SCAI/SIR/STS/SVM  Guidelines for the Diagnosis and Management of Patients With Thoracic Aortic Disease. Circulation. 2010; 121: Z610-R604. Aortic aneurysm NOS (ICD10-I71.9). 5. Moderate cardiomegaly. Dilated main pulmonary artery, suggesting pulmonary arterial hypertension. 6. Trace ascites. Minimal anasarca. 7. New moderate diffuse bladder wall thickening, suggesting cystitis. Suggest correlation with urinalysis. 8. Marked left colonic diverticulosis. 9. Mild prostatomegaly. 10.  Aortic Atherosclerosis (ICD10-I70.0). Electronically Signed   By: Delbert Phenix M.D.   On: 07/26/2023 13:23   CT CORONARY MORPH W/CTA COR W/SCORE W/CA W/CM &/OR WO/CM Addendum Date: 07/25/2023 ADDENDUM REPORT: 07/25/2023 17:01 EXAM: OVER-READ INTERPRETATION  CT CHEST The following report is an over-read performed by radiologist Dr. Knute Neu [facility] on 07/25/2023. This over-read does not include interpretation of cardiac or coronary anatomy or pathology. The cardiac CTA interpretation by the cardiologist is attached. COMPARISON:  Chest radiograph dated 07/23/2023, CTA chest dated 06/04/2023 FINDINGS: Cardiovascular: Partially imaged PA catheter tip terminates in the distal right main pulmonary artery. Aortic atherosclerosis. Mediastinum/Nodes: Normal esophagus. No pathologically enlarged mediastinal or hilar lymph nodes. Lungs/Pleura: The imaged central airways are patent. Mild diffuse bronchial wall thickening with bilateral lower lobe subsegmental atelectasis. Right lower lobe atelectasis distal to the subsegmental mucous plugging with adjacent ground-glass opacities. Basilar left lower lobe subsegmental atelectasis. Medial left lower lobe subsolid nodule measures 12 x 6 mm (5:73). No pneumothorax. Large right pleural effusion. Trace left pleural effusion. Upper abdomen: Normal. Musculoskeletal: No acute or abnormal lytic or blastic osseous lesions. IMPRESSION: 1. Right lower lobe ground-glass opacities, likely pulmonary edema. 2. Large  right pleural effusion. Trace left pleural effusion. 3. Bilateral lower lobe subsegmental atelectasis distal to subsegmental mucous plugging. 4. Medial left lower lobe subsolid nodule measures 12 x 6 mm. Initial follow-up with CT at 6 months is recommended to confirm persistence. If persistent, repeat CT is recommended every 2 years until 5 years of stability has been established. This recommendation follows the consensus statement: Guidelines for Management of Incidental Pulmonary Nodules Detected on CT Images: From the Fleischner Society 2017; Radiology 2017; 284:228-243. 5.  Aortic Atherosclerosis (ICD10-I70.0). Electronically Signed   By: Agustin Cree M.D.   On: 07/25/2023 17:01   Result Date: 07/25/2023 CLINICAL DATA:  Aortic Valve pathology with assessment for TAVR EXAM: Cardiac TAVR CT TECHNIQUE: The  patient was scanned on a [employer] 192 slice scanner. A 120 kV retrospective scan was triggered in the descending thoracic aorta at 111 HU's. Gantry rotation speed was 270 msecs and collimation was .9 mm. No beta blockade or nitro were given. The 3D data set was reconstructed in 5% intervals of the R-R cycle. Systolic and diastolic phases were analyzed on a dedicated work station using MPR, MIP and VRT modes. The patient received 95 cc of contrast. FINDINGS: Aortic Valve: Severely thickened aortic valve with calcification and reduced excursion the planimeter valve area is 0.942 Sq cm consistent with severe aortic stenosis Morphology: Three cusp, bicuspid valve but with only one normal commissure (left-non). 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 Annular calcification: Moderate- two mild calcifications. Aortic Valve Calcium Score: 6119 Perimembranous septal diameter: 4 mm Mitral Valve: No calcifications Aortic Annulus Measurements Major annulus diameter: 33 mm Minor annulus diameter: 30 mm Annular perimeter: 98 mm Annular area: 7.51 cm2 Aortic Measurements Sinotubular Junction: 37 mm Ascending Thoracic Aorta: 52 mm  Aortic Arch: 35 mm Descending Thoracic Aorta: 30 mm Sinus of Valsalva Measurements: Right coronary cusp width: 39 mm Left coronary cusp width: 39 mm Non coronary cusp width: 44 mm Coronary Artery Height above Annulus: Left Main: 18 mm Left SoV height: 29 mm Right Coronary: 25 mm Right SoV height: 29 mm Optimum Fluoroscopic Angle for Delivery: LAO 19, CAU 14 Cusp overlay view angle: RAO 0, CAU 35 Valves for structural team consideration: Heart team discussion recommended (scene saved) Sapien not recommended Evolut 34 mm valve is undersized for this annulus. Non TAVR Valve Findings: Coronary Arteries: Normal coronary origin. Study not completed with nitroglycerin. Coronary Calcium Score: 7 (LCX) Percentile: 23rd for age, sex, and race matched control. Systemic veins: Normal anatomy. Main Pulmonary artery: Severe dilation 38 mm; catheter noted Pulmonary veins: Right middle pulmonary vein noted Left atrial appendage: patent Interatrial septum: No communications Chamber dimensions: Biatrial dilation, biventricular dilation Pericardium: No calcification Extra Cardiac Findings as per separate reporting. Notable artifacts: Cardiac motion Image quality: Poor IMPRESSION: 1. Severe Aortic stenosis. Concerns that this anatomy is not suitable for TAVR. Heart team discussion recommended. 2.  Large thoracic aortic aneurysm RECOMMENDATIONS: The proposed cut-off value of 1,651 AU yielded a 93 % sensitivity and 75 % specificity in grading AS severity in patients with classical low-flow, low-gradient AS. Proposed different cut-off values to define severe AS for men and women as 2,065 AU and 1,274 AU, respectively. The joint European and American recommendations for the assessment of AS consider the aortic valve calcium score as a continuum - a very high calcium score suggests severe AS and a low calcium score suggests severe AS is unlikely. Sunday Shams, et al. 2017 ESC/EACTS Guidelines for the management of valvular  heart disease. Eur Heart J [phone] Coronary artery calcium (CAC) score is a strong predictor of incident coronary heart disease (CHD) and provides predictive information beyond traditional risk factors. CAC scoring is reasonable to use in the decision to withhold, postpone, or initiate statin therapy in intermediate-risk or selected borderline-risk asymptomatic adults (age 42-75 years and LDL-C >=70 to <190 mg/dL) who do not have diabetes or established atherosclerotic cardiovascular disease (ASCVD).* In intermediate-risk (10-year ASCVD risk >=7.5% to <20%) adults or selected borderline-risk (10-year ASCVD risk >=5% to <7.5%) adults in whom a CAC score is measured for the purpose of making a treatment decision the following recommendations have been made: If CAC = 0, it is reasonable to withhold statin therapy and reassess in  5 to 10 years, as long as higher risk conditions are absent (diabetes mellitus, family history of premature CHD in first degree relatives (males <55 years; females <65 years), cigarette smoking, LDL >=190 mg/dL or other independent risk factors). If CAC is 1 to 99, it is reasonable to initiate statin therapy for patients >=58 years of age. If CAC is >=100 or >=75th percentile, it is reasonable to initiate statin therapy at any age. Cardiology referral should be considered for patients with CAC scores >=400 or >=75th percentile. *2018 AHA/ACC/AACVPR/AAPA/ABC/ACPM/ADA/AGS/APhA/ASPC/NLA/PCNA Guideline on the Management of Blood Cholesterol: A Report of the American College of Cardiology/American Heart Association Task Force on Clinical Practice Guidelines. J Am Coll Cardiol. 2019;73(24):3168-3209. Mahesh  Chandrasekhar Electronically Signed: By: Riley Lam M.D. On: 07/25/2023 16:02   DG CHEST PORT 1 VIEW Result Date: 07/23/2023 CLINICAL DATA:  Intra-aortic balloon pump assist. EXAM: PORTABLE CHEST 1 VIEW COMPARISON:  07/20/2023 FINDINGS: Stable enlarged cardiac silhouette  and tortuous aorta. Increased right pleural fluid with posterior layering today. Mild increase in prominence of the pulmonary vasculature and interstitial markings right jugular Swan-Ganz catheter tip in the distal right intersegmental pulmonary artery. Intra-aortic balloon pump marker in the distal aortic arch/proximal descending aorta. No pneumothorax. Unremarkable bones. IMPRESSION: 1. Increased right pleural fluid with posterior layering today. 2. Interval mild pulmonary venous congestion and interstitial edema. 3. Stable cardiomegaly. 4. Support devices, as described above. Electronically Signed   By: Beckie Salts M.D.   On: 07/23/2023 09:54     STS Risk Calculator: Procedure Type: Isolated AVR Perioperative OutcomeEstimate % Operative Mortality7.04% Morbidity & Mortality40.5% Stroke1.32% Renal Failure2.32% Reoperation5.47% Prolonged Ventilation32.7% Deep Sternal Wound Infection0.033% [facility] Stay (>14 days)19.4% [facility] Stay (<6 days)*13.9%   ____________________________     [facility] Cardiomyopathy Questionnaire      07/23/2023    1:41 PM  KCCQ-12  1 a. Ability to shower/bathe Extremely limited  1 b. Ability to walk 1 block Slightly limited  1 c. Ability to hurry/jog Other, Did not do  2. Edema feet/ankles/legs Every morning  3. Limited by fatigue 3+ times per week, not every day  4. Limited by dyspnea All of the time  5. Sitting up / on 3+ pillows Never over the past 2 weeks  6. Limited enjoyment of life Limited quite a bit  7. Rest of life w/ symptoms Mostly dissatisfied  8 a. Participation in hobbies Limited quite a bit  8 b. Participation in chores Limited quite a bit  8 c. Visiting family/friends N/A, did not do for other reasons      _________________________________  Pre Surgical Assessment: 5 M Walk Test  62M=16.33ft  5 Meter Walk Test- trial 1: 10 seconds 5 Meter Walk Test- trial 2: 11.1 seconds 5 Meter Walk Test- trial 3: 11.6 seconds 5  Meter Walk Test Average: 10.9 seconds   Assessment and Plan:   [name] Daniel is a 68 y.o. male with symptoms of severe stage D2 aortic stenosis with NYHA Class IV symptoms currently admitted with acute systolic CHF with cardiogenic shock requiring dobutamine and mechanical support with an IAPB. IAPB now removed and weaned down to DBA 2.65mcg.   Echo 06/04/23 showed EF 20%, mild LVH, mild RV dysfunction, mild-mod pericardial effusion near the right atrium, and severe LFLG AS with mean gradient 29 mm hg (34mm hg by Pedhoff), AVA 0.49cm2, DVI 0.16.    CT angio chest 06/04/23 was negative for PE but showed pulmonary edema and a TAA measuring 5cm.  [facility] 07/22/23 which showed non obstructive CAD (aneurysmal ascending aorta/root made coronary engagement difficult), normal filling pressures after diuresis, low CO by Fick and markedly low by thermodilution. IABP placed with low CO on milrinone 0.375. He was switched from milrinone to dobutamine.   Cardiac gated CTA of the heart revealed a functionally unicuspid valve. It is a three cusp, bicuspid valve but with only one normal commissure (left-non). He technically sizes outside the IFU for a TAVR valve. There is a possibility that he could be treated with a 29 mm Edwards Sapien S3UR with 5cc extra volume place 80/20 aortic. He does have adequate vasculature for a TF approach. Cardiac CT reported a left pulmonary nodule and large right pleural effusion. CTA also reported a 5.1cm TAA, dense focus of consolidation in the posterior basilar right lower lobe with surrounding ground-glass opacity, without significant volume loss, new from 06/04/2023 chest CT, compatible with pneumonia and new moderate diffuse bladder wall thickening, suggesting cystitis.   I have reviewed the natural history of aortic stenosis with the patient. We have discussed the limitations of medical therapy and the poor prognosis associated with symptomatic aortic stenosis. We have reviewed  potential treatment options, including palliative medical therapy, conventional surgical aortic valve replacement, and transcatheter aortic valve replacement. We discussed treatment options in the context of this patient's specific comorbid medical conditions.    The patient's predicted risk of mortality with conventional aortic valve replacement is 7.04% primarily based on cardiogenic shock, severe LV dysfunction, CKD stage III, thrombocytopenia, and low BMI. Other significant comorbid conditions include RV dysfunction, limited social support, medical non compliance and previous alcohol abuse (although reports not drinking in several months). The patient ideally would get surgical AVR with concomitant ascending aorta repair, but the patient would not survive this with his biventricular failure and cardiogenic shock. He also has limited social support.   He is posted for inpatient TAVR-TF next Tuesday with Dr. Excell Seltzer and Dr. Laneta Simmers. He is not a bailout candidate. CTA showed a right pneumonia and Will check a UA given CT findings and discuss PNA with primary team.  Dr. Leafy Ro to follow.    For questions or updates, please contact [facility] Please consult [email or web address] for contact info under    Signed, Cline Crock, PA-C  07/26/2023 2:15 PM  Agree with the above Pt is in difficult situation with very poor LV function secondary to severe AS (bicuspid) with associated ascending aortic aneurysm. Pt has been evaluated by Dr Laneta Simmers last week and was turned down as a surgical candidate. He however is on dobutamine and only opportunity for survival would be TAVR. However this is at increased risk and will require the use of the Sapien valve with increased volume to provide success. He may require mechanical support to have the valve implanted but is not a surgical candidate for open replacement. This was discussed with the patient and he appears to understand

## 2023-07-27 LAB — CBC
HCT: 37.7 % — ABNORMAL LOW (ref 39.0–52.0)
Hemoglobin: 12.5 g/dL — ABNORMAL LOW (ref 13.0–17.0)
MCH: 31.3 pg (ref 26.0–34.0)
MCHC: 33.2 g/dL (ref 30.0–36.0)
MCV: 94.5 fL (ref 80.0–100.0)
Platelets: 116 10*3/uL — ABNORMAL LOW (ref 150–400)
RBC: 3.99 MIL/uL — ABNORMAL LOW (ref 4.22–5.81)
RDW: 16.4 % — ABNORMAL HIGH (ref 11.5–15.5)
WBC: 4.6 10*3/uL (ref 4.0–10.5)
nRBC: 0 % (ref 0.0–0.2)

## 2023-07-27 LAB — BASIC METABOLIC PANEL
Anion gap: 8 (ref 5–15)
BUN: 27 mg/dL — ABNORMAL HIGH (ref 8–23)
CO2: 27 mmol/L (ref 22–32)
Calcium: 8.6 mg/dL — ABNORMAL LOW (ref 8.9–10.3)
Chloride: 99 mmol/L (ref 98–111)
Creatinine, Ser: 0.9 mg/dL (ref 0.61–1.24)
GFR, Estimated: >60 mL/min (ref >60)
Glucose, Bld: 115 mg/dL — ABNORMAL HIGH (ref 70–99)
Potassium: 4.3 mmol/L (ref 3.5–5.1)
Sodium: 134 mmol/L — ABNORMAL LOW (ref 135–145)

## 2023-07-27 LAB — COOXEMETRY PANEL
Carboxyhemoglobin: 2.8 % — ABNORMAL HIGH (ref 0.5–1.5)
Methemoglobin: <0.7 % (ref 0.0–1.5)
O2 Saturation: 69 %
Total hemoglobin: 12.5 g/dL (ref 12.0–16.0)

## 2023-07-27 LAB — MAGNESIUM: Magnesium: 2.1 mg/dL (ref 1.7–2.4)

## 2023-07-27 MED ORDER — SODIUM CHLORIDE 0.9 % IV SOLN: 2.0000 g | Freq: Three times a day (TID) | INTRAVENOUS | Status: DC

## 2023-07-27 MED ORDER — FUROSEMIDE 10 MG/ML IJ SOLN
40.0000 mg | Freq: Once | INTRAMUSCULAR | Status: AC
  Administered 2023-07-27: 40 mg via INTRAVENOUS
  Filled 2023-07-27: qty 4

## 2023-07-27 MED ORDER — PIPERACILLIN-TAZOBACTAM 3.375 G IVPB
3.3750 g | Freq: Three times a day (TID) | INTRAVENOUS | Status: DC
  Administered 2023-07-27 – 2023-07-29 (×6): 3.375 g via INTRAVENOUS
  Filled 2023-07-27 (×5): qty 50

## 2023-07-27 NOTE — Progress Notes (Addendum)
 Patient ID: [name] Daniel, male   DOB: Jul 17, 1955, 68 y.o.   MRN: [id number]     Advanced Heart Failure Rounding Note  Cardiologist: Dina Rich, MD  Chief Complaint: Cardiogenic Shock. Subjective:   2/17 Transferred ICU from cath lab. LHC nonobstructive CAD. IABP placed and continued on milrinone. Due to low CO/CI switched to DBA. CT Surgery/Structural Team consulted for aortic stenosis. 2/19: IABP removed.   Remains on DBA 2.5 Co-ox 69 CVP 2-3.   Denies CP or SOB. Sitting up in chair  UA positive. CT scan suggests RLL PNA. PCT < 0.10  Objective:   Weight Range: 65.6 kg Body mass index is 19.08 kg/m.   Vital Signs:   Temp:  [97.6 F (36.4 C)-99.1 F (37.3 C)] 97.6 F (36.4 C) (02/22 0800) Pulse Rate:  [99-210] 102 (02/22 0800) Resp:  [14-30] 16 (02/22 0800) BP: (89-111)/(70-92) 94/70 (02/22 0800) SpO2:  [86 %-99 %] 92 % (02/22 0800) Weight:  [65.6 kg] 65.6 kg (02/22 0500) Last BM Date : 07/26/23  Weight change: Filed Weights   07/25/23 0316 07/26/23 0500 07/27/23 0500  Weight: 62.3 kg 63.3 kg 65.6 kg   Intake/Output:   Intake/Output Summary (Last 24 hours) at 07/27/2023 1049 Last data filed at 07/27/2023 0800 Gross per 24 hour  Intake 470.61 ml  Output 1050 ml  Net -579.39 ml    Physical Exam   General:  Frail cachectic appearing. No resp difficulty HEENT: normal Neck: supple. no JVD. Carotids 2+ bilat; no bruits. No lymphadenopathy or thryomegaly appreciated. Cor: PMI nondisplaced. Regular tachy 3/6 AS Lungs: RLL crackles Abdomen: soft, nontender, nondistended. No hepatosplenomegaly. No bruits or masses. Good bowel sounds. Extremities: no cyanosis, clubbing, rash, edema Neuro: alert & orientedx3, cranial nerves grossly intact. moves all 4 extremities w/o difficulty. Affect pleasant  Telemetry   ST 100-110 Personally reviewed  Labs    CBC Recent Labs    07/25/23 0500 07/26/23 0421 07/27/23 0637  WBC 6.0 5.3 4.6  NEUTROABS 4.0 3.1  --   HGB  13.8 12.9* 12.5*  HCT 40.6 38.2* 37.7*  MCV 92.9 93.6 94.5  PLT 81* 94* 116*   Basic Metabolic Panel Recent Labs    40/98/11 0421 07/27/23 0637  NA 131* 134*  K 4.6 4.3  CL 94* 99  CO2 27 27  GLUCOSE 109* 115*  BUN 26* 27*  CREATININE 1.02 0.90  CALCIUM 8.8* 8.6*  MG 2.2 2.1   Liver Function Tests No results for input(s): "AST", "ALT", "ALKPHOS", "BILITOT", "PROT", "ALBUMIN" in the last 72 hours.  No results for input(s): "LIPASE", "AMYLASE" in the last 72 hours. Cardiac Enzymes No results for input(s): "CKTOTAL", "CKMB", "CKMBINDEX", "TROPONINI" in the last 72 hours.  BNP: BNP (last 3 results) Recent Labs    06/03/23 2152 07/18/23 1609  BNP 3,234.0* >4,500.0*    ProBNP (last 3 results) No results for input(s): "PROBNP" in the last 8760 hours.   D-Dimer No results for input(s): "DDIMER" in the last 72 hours. Hemoglobin A1C No results for input(s): "HGBA1C" in the last 72 hours.  Fasting Lipid Panel No results for input(s): "CHOL", "HDL", "LDLCALC", "TRIG", "CHOLHDL", "LDLDIRECT" in the last 72 hours. Thyroid Function Tests No results for input(s): "TSH", "T4TOTAL", "T3FREE", "THYROIDAB" in the last 72 hours.  Invalid input(s): "FREET3"   Other results:   Imaging    No results found.    Medications:   Scheduled Medications:  aspirin EC  81 mg Oral Daily   Chlorhexidine Gluconate Cloth  6 each  Topical Daily   cyanocobalamin  1,000 mcg Oral Daily   digoxin  0.125 mg Oral Daily   enoxaparin (LOVENOX) injection  40 mg Subcutaneous Q24H   feeding supplement  237 mL Oral TID BM   folic acid  1 mg Oral Daily   multivitamin with minerals  1 tablet Oral Daily   rosuvastatin  20 mg Oral Daily   sodium chloride flush  10-40 mL Intracatheter Q12H   sodium chloride flush  3 mL Intravenous Q12H   spironolactone  25 mg Oral Daily   thiamine  100 mg Oral Daily    Infusions:  sodium chloride 10 mL/hr at 07/27/23 0800   DOBUTamine 2.5 mcg/kg/min  (07/27/23 0800)    PRN Medications: sodium chloride, acetaminophen, magnesium hydroxide, ondansetron (ZOFRAN) IV, mouth rinse, oxyCODONE, sodium chloride flush, sodium chloride flush, traMADol, zolpidem  Assessment/Plan   1. Acute on chronic systolic CHF/cardiogenic shock: Echo in 12/24 showed EF 20%, mild RV dysfunction, severe low flow/low gradient aortic stenosis with mean gradient 35 mmHg and AVA 0.49 cm^2. 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  He has severe aortic stenosis.  He also has anterior Qs on ECG so prior MI is certainly possible. He was re-admitted with marked volume overload and low cardiac output, co-ox 38%.  He was started on milrinone which was increased to 0.375 and diuresed with lasix drip. - 2/17 cath - IABP placed and continued on Milrinone. Due to low CO/CI he was switched to Dobutamine. 2/19 IABP removed. - Now on DBA in setting of severe LV dysfunction and severe AS - Co-ox 69% Volume status mildly elevated - Continue DBA to support until TAVR - Diurese gently - Continue spiro 25 mg daily.  - Continue digoxin 0.125 daily.  - No b-blocker with shock  2. Elevated troponin: Mild elevation with no trend. Suspect demand ischemia with volume overload.  - LHC 2/17 with nonobstructive CAD - Continue statin - Continue ASA 81 - No s/s angina  3. Aortic stenosis: Low flow/low gradient severe AS on 12/24 echo. This contributes to CHF.  He will ultimately need the aortic valve replaced.  - CT Surgery/Structural Team following. - TAVR CTs done 07/25/23 to further evaluated candidacy for TAVR. - Plan for high-risk TAVR Tuesday 2/25  4. Thrombocytopenia: Platelets have been mildly low since 12/24.  Follow.  - PLT count 116K  5. Ascending aortic aneurysm: 5.0 cm on 12/24 CTA chest.  - not surgical candidate  6. H/o ETOH abuse: Not very forthcoming on current drinking habits but apparently has quit for about 6 months.   7. Deconditioning - PT/OT following  8.  Hyponatremia - Restrict free water.  - 134 today  9. + UTI and RLL PNA on CT - PCT < 0.10 - will cover with Zosyn. D/w CCM and PharmD  CRITICAL CARE Performed by: Arvilla Meres  Total critical care time: 40 minutes  Critical care time was exclusive of separately billable procedures and treating other patients.  Critical care was necessary to treat or prevent imminent or life-threatening deterioration.  Critical care was time spent personally by me (independent of midlevel providers or residents) on the following activities: development of treatment plan with patient and/or surrogate as well as nursing, discussions with consultants, evaluation of patient's response to treatment, examination of patient, obtaining history from patient or surrogate, ordering and performing treatments and interventions, ordering and review of laboratory studies, ordering and review of radiographic studies, pulse oximetry and re-evaluation of patient's condition.   Length of Stay:  9  Arvilla Meres, MD  07/27/2023, 10:49 AM  Advanced Heart Failure Team Pager [phone] (M-F; 7a - 5p)

## 2023-07-28 LAB — CBC
HCT: 38.5 % — ABNORMAL LOW (ref 39.0–52.0)
Hemoglobin: 12.8 g/dL — ABNORMAL LOW (ref 13.0–17.0)
MCH: 31.3 pg (ref 26.0–34.0)
MCHC: 33.2 g/dL (ref 30.0–36.0)
MCV: 94.1 fL (ref 80.0–100.0)
Platelets: 139 10*3/uL — ABNORMAL LOW (ref 150–400)
RBC: 4.09 MIL/uL — ABNORMAL LOW (ref 4.22–5.81)
RDW: 16.6 % — ABNORMAL HIGH (ref 11.5–15.5)
WBC: 5.4 10*3/uL (ref 4.0–10.5)
nRBC: 0 % (ref 0.0–0.2)

## 2023-07-28 LAB — BASIC METABOLIC PANEL
Anion gap: 15 (ref 5–15)
BUN: 28 mg/dL — ABNORMAL HIGH (ref 8–23)
CO2: 28 mmol/L (ref 22–32)
Calcium: 8.6 mg/dL — ABNORMAL LOW (ref 8.9–10.3)
Chloride: 91 mmol/L — ABNORMAL LOW (ref 98–111)
Creatinine, Ser: 1.16 mg/dL (ref 0.61–1.24)
GFR, Estimated: >60 mL/min (ref >60)
Glucose, Bld: 127 mg/dL — ABNORMAL HIGH (ref 70–99)
Potassium: 4.3 mmol/L (ref 3.5–5.1)
Sodium: 134 mmol/L — ABNORMAL LOW (ref 135–145)

## 2023-07-28 LAB — COOXEMETRY PANEL
Carboxyhemoglobin: 1.3 % (ref 0.5–1.5)
Carboxyhemoglobin: 2.8 % — ABNORMAL HIGH (ref 0.5–1.5)
Methemoglobin: <0.7 % (ref 0.0–1.5)
Methemoglobin: <0.7 % (ref 0.0–1.5)
O2 Saturation: 55.3 %
O2 Saturation: 63.2 %
Total hemoglobin: 12.8 g/dL (ref 12.0–16.0)
Total hemoglobin: 12.7 g/dL (ref 12.0–16.0)

## 2023-07-28 LAB — MAGNESIUM: Magnesium: 2.1 mg/dL (ref 1.7–2.4)

## 2023-07-28 MED ORDER — TRAZODONE HCL 50 MG PO TABS
100.0000 mg | ORAL_TABLET | Freq: Every day | ORAL | Status: DC
  Administered 2023-07-28 – 2023-07-31 (×4): 100 mg via ORAL
  Filled 2023-07-28 (×4): qty 2

## 2023-07-28 MED FILL — Verapamil HCl IV Soln 2.5 MG/ML: INTRAVENOUS | Qty: 2 | Status: AC

## 2023-07-28 NOTE — Plan of Care (Signed)

## 2023-07-28 NOTE — TOC Initial Note (Signed)
 Transition of Care [facility]) - Initial/Assessment Note    Patient Details  Name: [name] Daniel MRN: [id number] Date of Birth: 03-01-1956  [facility] CM/SW Contact:    Marliss Coots, LCSW Phone Number: 07/28/2023, 10:07 AM  Clinical Narrative:                  10:07 AM CSW introduced herself and role to patient at bedside. CMA Alcario Drought was also present shadowing CSW. Patient consented CSW to speak in front of [place]. CSW informed patient of therapy recommendation of discharge to SNF. Patient expressed uncertainty of discharging to SNF. CSW informed patient of home health option and of possibility of lack of home therapy coverage by insurance. CSW offered patient the rest of today to decide on discharge plan. Patient accepted CSW offer. CSW relayed patient's uncertainty to medical team.  Expected Discharge Plan: Home w Home Health Services Barriers to Discharge: Continued Medical Work up   Patient Goals and CMS Choice Patient states their goals for this hospitalization and ongoing recovery are:: wants to get better   Choice offered to / list presented to : Patient [place] ownership interest in [facility]provided to::  (n/a)    Expected Discharge Plan and Services In-house Referral: Clinical Social Work     Living arrangements for the past 2 months: Single Family Home                                      Prior Living Arrangements/Services Living arrangements for the past 2 months: Single Family Home Lives with:: Self Patient language and need for interpreter reviewed:: Yes Do you feel safe going back to the place where you live?: Yes      Need for Family Participation in Patient Care: Yes (Comment) Care giver support system in place?: Yes (comment)   Criminal Activity/Legal Involvement Pertinent to Current Situation/Hospitalization: No - Comment as needed  Activities of Daily Living   ADL Screening (condition at time of  admission) Independently performs ADLs?: Yes (appropriate for developmental age) Is the patient deaf or have difficulty hearing?: No Does the patient have difficulty seeing, even when wearing glasses/contacts?: No Does the patient have difficulty concentrating, remembering, or making decisions?: No  Permission Sought/Granted Permission sought to share information with : Case Manager, Family Supports Permission granted to share information with : Yes, Verbal Permission Granted  Share Information with NAME: Rolene Arbour     Permission granted to share info w Relationship: sister  Permission granted to share info w Contact Information: [phone]  Emotional Assessment Appearance:: Appears older than stated age Attitude/Demeanor/Rapport: Engaged Affect (typically observed): Accepting Orientation: : Oriented to Self, Oriented to Place, Oriented to  Time, Oriented to Situation Alcohol / Substance Use: Not Applicable Psych Involvement: No (comment)  Admission diagnosis:  Acute on chronic systolic CHF (congestive heart failure) (HCC) [I50.23] Acute on chronic congestive heart failure, unspecified heart failure type (HCC) [I50.9] Patient Active Problem List   Diagnosis Date Noted   Protein-calorie malnutrition, severe 07/24/2023   T2DM (type 2 diabetes mellitus) (HCC) 07/24/2023   History of alcohol abuse 07/24/2023   Scrotal edema 07/18/2023   Peripheral edema 07/18/2023   Acute on chronic systolic CHF (congestive heart failure) (HCC) 07/18/2023   B12 deficiency 06/05/2023   Folate deficiency 06/05/2023   Acute HFrEF (heart failure with reduced ejection fraction) (HCC) 06/05/2023   Aortic stenosis, severe 06/05/2023  Cardiomyopathy (HCC) 06/05/2023   Stage 3a chronic kidney disease (CKD) (HCC) 06/05/2023   Elevated d-dimer 06/05/2023   Thrombocytopenia (HCC) 06/04/2023   Thoracic aortic aneurysm (HCC) 06/04/2023   Incarcerated hernia 05/09/2023   Intestinal obstruction (HCC)  05/09/2023   Upper respiratory infection 05/08/2023   PCP:  Patient, No Pcp Per Pharmacy:   [id number] - [place], [place] - [address] [place] [address] Phone: [phone] Fax: [phone]  [facility] - [place], [place] - [address] [place] [address] Phone: [phone] Fax: [phone]  [facility] [place], [place] - [address] [place] [address] [place] [address] Phone: [phone] Fax: [phone]     Social Drivers of Health (SDOH) Social History: SDOH Screenings   Food Insecurity: No Food Insecurity (07/18/2023)  Housing: Low Risk  (07/18/2023)  Transportation Needs: No Transportation Needs (07/18/2023)  Utilities: Not At Risk (07/18/2023)  Social Connections: Socially Isolated (07/18/2023)  Tobacco Use: Medium Risk (07/18/2023)   SDOH Interventions:     Readmission Risk Interventions    05/09/2023   12:49 PM  Readmission Risk Prevention Plan  Post Dischage Appt Not Complete  Medication Screening Complete  Transportation Screening Complete

## 2023-07-28 NOTE — Progress Notes (Signed)
 Physical Therapy Treatment Patient Details Name: [name] Daniel MRN: [id number] DOB: 1956-03-01 Today's Date: 07/28/2023   History of Present Illness 68 yo male admitted 2/13 after presenting to hernia repair followup visit with SOB, LB edema, CHF exacerbation. 2/17 RHC/LHC. IABP 2/17-2/19. [facility]-[place] 2/17-2/21. PMhx: TAA, HFrEF, severe AS, CM, T2DM, CKD, 12/24 hernia repair    PT Comments  Pt is progressing towards goals. Pt was able to increase gait distance this session. Min A for sit to stand and CGA for gait with verbal cues for preventing drifting to the L. Pt reports he has had issues with his L foot for years. Due to pt current functional status, home set up and available assistance at home recommending skilled physical therapy services < 3 hours/day in order to address strength, balance and functional mobility to decrease risk for falls, injury, immobility, skin break down and re-hospitalization.      If plan is discharge home, recommend the following: A little help with walking and/or transfers;Assist for transportation;Help with stairs or ramp for entrance;Supervision due to cognitive status     Equipment Recommendations  Rolling walker (2 wheels)       Precautions / Restrictions Precautions Precautions: Fall Restrictions Weight Bearing Restrictions Per Provider Order: No     Mobility  Bed Mobility     General bed mobility comments: Pt in recliner on arrival and departure.    Transfers Overall transfer level: Needs assistance Equipment used: Rolling walker (2 wheels), None Transfers: Sit to/from Stand Sit to Stand: Min assist           General transfer comment: verbal cues for safe hand placement.    Ambulation/Gait Ambulation/Gait assistance: Contact guard assist Gait Distance (Feet): 110 Feet Assistive device: Rolling walker (2 wheels) Gait Pattern/deviations: Decreased weight shift to left, Antalgic, Knee hyperextension - left, Decreased step length - left,  Step-through pattern Gait velocity: decreased Gait velocity interpretation: <1.31 ft/sec, indicative of household ambulator   General Gait Details: L foot remained supinated throughout gait with forefoot initial contact. Pt has step through gait pattern on the L with partial step to gait pattern on the R. Second person just to assist with IV pole.      Balance Overall balance assessment: Needs assistance, Mild deficits observed, not formally tested Sitting-balance support: No upper extremity supported, Feet supported Sitting balance-Leahy Scale: Good     Standing balance support: Bilateral upper extremity supported, During functional activity, Reliant on assistive device for balance Standing balance-Leahy Scale: Fair Standing balance comment: reliant on RW, Min UE support. No overt LOB        Communication Communication Communication: No apparent difficulties  Cognition Arousal: Alert Behavior During Therapy: WFL for tasks assessed/performed   PT - Cognitive impairments: No apparent impairments       Following commands: Intact      Cueing Cueing Techniques: Verbal cues, Tactile cues     General Comments General comments (skin integrity, edema, etc.): No skin issues outside of gown noticed. Pt HR and O2 sats remained WNL throughout gait      Pertinent Vitals/Pain Pain Assessment Pain Assessment: No/denies pain     PT Goals (current goals can now be found in the care plan section) Acute Rehab PT Goals Patient Stated Goal: to get better PT Goal Formulation: With patient Time For Goal Achievement: 08/09/23 Potential to Achieve Goals: Good Progress towards PT goals: Progressing toward goals    Frequency    Min 1X/week      PT Plan  Continue with current POC        AM-PAC PT "6 Clicks" Mobility   Outcome Measure  Help needed turning from your back to your side while in a flat bed without using bedrails?: A Little Help needed moving from lying on your  back to sitting on the side of a flat bed without using bedrails?: A Little Help needed moving to and from a bed to a chair (including a wheelchair)?: A Little Help needed standing up from a chair using your arms (e.g., wheelchair or bedside chair)?: A Little Help needed to walk in hospital room?: A Little Help needed climbing 3-5 steps with a railing? : A Little 6 Click Score: 18    End of Session Equipment Utilized During Treatment: Gait belt Activity Tolerance: Patient tolerated treatment well Patient left: in chair;with call bell/phone within reach;with chair alarm set Nurse Communication: Mobility status PT Visit Diagnosis: Unsteadiness on feet (R26.81);Muscle weakness (generalized) (M62.81);Difficulty in walking, not elsewhere classified (R26.2)     Time: 1610-9604 PT Time Calculation (min) (ACUTE ONLY): 24 min  Charges:    $Therapeutic Activity: 8-22 mins PT General Charges $$ ACUTE PT VISIT: 1 Visit                    Harrel Carina, DPT, CLT  Acute Rehabilitation Services Office: [phone] (Secure chat preferred)    Claudia Desanctis 07/28/2023, 3:06 PM

## 2023-07-28 NOTE — Progress Notes (Signed)
 Patient ID: [name] Daniel, male   DOB: 16-Nov-1955, 68 y.o.   MRN: [id number]     Advanced Heart Failure Rounding Note  Cardiologist: Dina Rich, MD  Chief Complaint: Cardiogenic Shock. Subjective:   2/17 Transferred ICU from cath lab. LHC nonobstructive CAD. IABP placed and continued on milrinone. Due to low CO/CI switched to DBA. CT Surgery/Structural Team consulted for aortic stenosis. 2/19: IABP removed.   Remains on DBA 2.5 Co-ox 55%   Received IV lasix yesterday. Out 2.3l CVP 6-7  Denies CP or SOB. Feels weak. Not very interactive  UA positive. CT scan suggests RLL PNA. PCT < 0.10 Zosyn started 2/22  Objective:   Weight Range: 64.9 kg Body mass index is 18.88 kg/m.   Vital Signs:   Temp:  [97.6 F (36.4 C)-98.1 F (36.7 C)] 98.1 F (36.7 C) (02/23 0800) Pulse Rate:  [92-118] 114 (02/23 0900) Resp:  [0-27] 14 (02/23 0900) BP: (82-111)/(54-94) 89/72 (02/23 0900) SpO2:  [84 %-97 %] 92 % (02/23 0900) Weight:  [64.9 kg] 64.9 kg (02/23 0500) Last BM Date : 07/26/23  Weight change: Filed Weights   07/26/23 0500 07/27/23 0500 07/28/23 0500  Weight: 63.3 kg 65.6 kg 64.9 kg   Intake/Output:   Intake/Output Summary (Last 24 hours) at 07/28/2023 0926 Last data filed at 07/28/2023 0900 Gross per 24 hour  Intake 278.26 ml  Output 2550 ml  Net -2271.74 ml    Physical Exam   General:  Frail cachectic appearing. No resp difficulty HEENT: normal Neck: supple. no JVD. Carotids 2+ bilat; no bruits. No lymphadenopathy or thryomegaly appreciated. Cor:  Regular tachy 3/6 AS Lungs: clear Abdomen: soft, nontender, nondistended. No hepatosplenomegaly. No bruits or masses. Good bowel sounds. Extremities: no cyanosis, clubbing, rash, edema Neuro: alert & orientedx3, cranial nerves grossly intact. moves all 4 extremities w/o difficulty. Affect pleasant  Telemetry   ST ~110 Personally reviewed  Labs    CBC Recent Labs    07/26/23 0421 07/27/23 0637 07/28/23 0503   WBC 5.3 4.6 5.4  NEUTROABS 3.1  --   --   HGB 12.9* 12.5* 12.8*  HCT 38.2* 37.7* 38.5*  MCV 93.6 94.5 94.1  PLT 94* 116* 139*   Basic Metabolic Panel Recent Labs    40/98/11 0637 07/28/23 0503  NA 134* 134*  K 4.3 4.3  CL 99 91*  CO2 27 28  GLUCOSE 115* 127*  BUN 27* 28*  CREATININE 0.90 1.16  CALCIUM 8.6* 8.6*  MG 2.1 2.1   Liver Function Tests No results for input(s): "AST", "ALT", "ALKPHOS", "BILITOT", "PROT", "ALBUMIN" in the last 72 hours.  No results for input(s): "LIPASE", "AMYLASE" in the last 72 hours. Cardiac Enzymes No results for input(s): "CKTOTAL", "CKMB", "CKMBINDEX", "TROPONINI" in the last 72 hours.  BNP: BNP (last 3 results) Recent Labs    06/03/23 2152 07/18/23 1609  BNP 3,234.0* >4,500.0*    ProBNP (last 3 results) No results for input(s): "PROBNP" in the last 8760 hours.   D-Dimer No results for input(s): "DDIMER" in the last 72 hours. Hemoglobin A1C No results for input(s): "HGBA1C" in the last 72 hours.  Fasting Lipid Panel No results for input(s): "CHOL", "HDL", "LDLCALC", "TRIG", "CHOLHDL", "LDLDIRECT" in the last 72 hours. Thyroid Function Tests No results for input(s): "TSH", "T4TOTAL", "T3FREE", "THYROIDAB" in the last 72 hours.  Invalid input(s): "FREET3"   Other results:   Imaging    No results found.    Medications:   Scheduled Medications:  aspirin EC  81  mg Oral Daily   Chlorhexidine Gluconate Cloth  6 each Topical Daily   cyanocobalamin  1,000 mcg Oral Daily   digoxin  0.125 mg Oral Daily   enoxaparin (LOVENOX) injection  40 mg Subcutaneous Q24H   feeding supplement  237 mL Oral TID BM   folic acid  1 mg Oral Daily   multivitamin with minerals  1 tablet Oral Daily   rosuvastatin  20 mg Oral Daily   sodium chloride flush  10-40 mL Intracatheter Q12H   sodium chloride flush  3 mL Intravenous Q12H   spironolactone  25 mg Oral Daily   thiamine  100 mg Oral Daily    Infusions:  DOBUTamine 2.5 mcg/kg/min  (07/28/23 0900)   piperacillin-tazobactam (ZOSYN)  IV 12.5 mL/hr at 07/28/23 0900    PRN Medications: acetaminophen, magnesium hydroxide, ondansetron (ZOFRAN) IV, mouth rinse, oxyCODONE, sodium chloride flush, sodium chloride flush, traMADol, zolpidem  Assessment/Plan   1. Acute on chronic systolic CHF/cardiogenic shock: Echo in 12/24 showed EF 20%, mild RV dysfunction, severe low flow/low gradient aortic stenosis with mean gradient 35 mmHg and AVA 0.49 cm^2. 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  He has severe aortic stenosis.  He also has anterior Qs on ECG so prior MI is certainly possible. He was re-admitted with marked volume overload and low cardiac output, co-ox 38%.  He was started on milrinone which was increased to 0.375 and diuresed with lasix drip. - 2/17 cath - IABP placed and continued on Milrinone. Due to low CO/CI he was switched to Dobutamine. 2/19 IABP removed. - Now on DBA in setting of severe LV dysfunction and severe AS - On DBA 2.5 Co-ox 55% Volume status ok. CVP 6-7 No further lasix today in setting of severe AS - Continue DBA to support until TAVR - Continue spiro 25 mg daily.  - Continue digoxin 0.125 daily.  - No b-blocker with shock  2. Elevated troponin: Mild elevation with no trend. Suspect demand ischemia with volume overload.  - LHC 2/17 with nonobstructive CAD - Continue statin - Continue ASA 81 - No s/s angina  3. Aortic stenosis: Low flow/low gradient severe AS on 12/24 echo. This contributes to CHF.  He will ultimately need the aortic valve replaced.  - CT Surgery/Structural Team following. - TAVR CTs done 07/25/23 to further evaluated candidacy for TAVR. - Plan for high-risk TAVR Tuesday 2/25   4. Thrombocytopenia: Platelets have been mildly low since 12/24.  Follow.  - PLT count 116K -> 139K  5. Ascending aortic aneurysm: 5.0 cm on 12/24 CTA chest.  - not surgical candidate  6. H/o ETOH abuse: Not very forthcoming on current drinking habits but  apparently has quit for about 6 months.   7. Deconditioning - PT/OT following - Ambulate today  8. Hyponatremia - Restrict free water.  - Stable at 134 today  9. + UTI and RLL PNA on CT - PCT < 0.10 - Zosyn started 2/22. Will cover for 7 days  CRITICAL CARE Performed by: Arvilla Meres  Total critical care time: 36 minutes  Critical care time was exclusive of separately billable procedures and treating other patients.  Critical care was necessary to treat or prevent imminent or life-threatening deterioration.  Critical care was time spent personally by me (independent of midlevel providers or residents) on the following activities: development of treatment plan with patient and/or surrogate as well as nursing, discussions with consultants, evaluation of patient's response to treatment, examination of patient, obtaining history from patient or surrogate, ordering  and performing treatments and interventions, ordering and review of laboratory studies, ordering and review of radiographic studies, pulse oximetry and re-evaluation of patient's condition.   Length of Stay: 10  Arvilla Meres, MD  07/28/2023, 9:26 AM  Advanced Heart Failure Team Pager [phone] (M-F; 7a - 5p)

## 2023-07-29 LAB — COOXEMETRY PANEL
Carboxyhemoglobin: 2 % — ABNORMAL HIGH (ref 0.5–1.5)
Methemoglobin: <0.7 % (ref 0.0–1.5)
O2 Saturation: 66.7 %
Total hemoglobin: 12.2 g/dL (ref 12.0–16.0)

## 2023-07-29 LAB — BASIC METABOLIC PANEL
Anion gap: 9 (ref 5–15)
BUN: 28 mg/dL — ABNORMAL HIGH (ref 8–23)
CO2: 29 mmol/L (ref 22–32)
Calcium: 8.7 mg/dL — ABNORMAL LOW (ref 8.9–10.3)
Chloride: 97 mmol/L — ABNORMAL LOW (ref 98–111)
Creatinine, Ser: 1.16 mg/dL (ref 0.61–1.24)
GFR, Estimated: >60 mL/min (ref >60)
Glucose, Bld: 113 mg/dL — ABNORMAL HIGH (ref 70–99)
Potassium: 4.1 mmol/L (ref 3.5–5.1)
Sodium: 135 mmol/L (ref 135–145)

## 2023-07-29 LAB — TYPE AND SCREEN
ABO/RH(D): O POS
Antibody Screen: NEGATIVE

## 2023-07-29 LAB — PROTIME-INR
INR: 1.1 (ref 0.8–1.2)
Prothrombin Time: 14.7 s (ref 11.4–15.2)

## 2023-07-29 LAB — MAGNESIUM: Magnesium: 2.1 mg/dL (ref 1.7–2.4)

## 2023-07-29 LAB — ABO/RH: ABO/RH(D): O POS

## 2023-07-29 MED ORDER — MAGNESIUM SULFATE 50 % IJ SOLN
40.0000 meq | INTRAMUSCULAR | Status: DC
  Filled 2023-07-29: qty 9.85

## 2023-07-29 MED ORDER — TEMAZEPAM 15 MG PO CAPS: 15.0000 mg | ORAL_CAPSULE | Freq: Once | ORAL | Status: DC | PRN

## 2023-07-29 MED ORDER — NOREPINEPHRINE 4 MG/250ML-% IV SOLN
0.0000 ug/min | INTRAVENOUS | Status: AC
  Administered 2023-07-30: 1 ug/min via INTRAVENOUS
  Filled 2023-07-29: qty 250

## 2023-07-29 MED ORDER — SPIRONOLACTONE 12.5 MG HALF TABLET
12.5000 mg | ORAL_TABLET | Freq: Every day | ORAL | Status: AC
  Administered 2023-07-29: 12.5 mg via ORAL

## 2023-07-29 MED ORDER — HEPARIN 30,000 UNITS/1000 ML (OHS) CELLSAVER SOLUTION
Status: DC
  Filled 2023-07-29: qty 1000

## 2023-07-29 MED ORDER — CEFAZOLIN SODIUM-DEXTROSE 2-4 GM/100ML-% IV SOLN
2.0000 g | INTRAVENOUS | Status: AC
  Administered 2023-07-30: 2 g via INTRAVENOUS
  Filled 2023-07-29: qty 100

## 2023-07-29 MED ORDER — POTASSIUM CHLORIDE CRYS ER 20 MEQ PO TBCR
40.0000 meq | EXTENDED_RELEASE_TABLET | Freq: Once | ORAL | Status: AC
  Administered 2023-07-29: 40 meq via ORAL
  Filled 2023-07-29: qty 2

## 2023-07-29 MED ORDER — FUROSEMIDE 10 MG/ML IJ SOLN
80.0000 mg | Freq: Once | INTRAMUSCULAR | Status: AC
  Administered 2023-07-29: 80 mg via INTRAVENOUS
  Filled 2023-07-29: qty 8

## 2023-07-29 MED ORDER — DEXMEDETOMIDINE HCL IN NACL 400 MCG/100ML IV SOLN
0.1000 ug/kg/h | INTRAVENOUS | Status: AC
  Administered 2023-07-30: 1 ug/kg/h via INTRAVENOUS
  Filled 2023-07-29: qty 100

## 2023-07-29 MED ORDER — POTASSIUM CHLORIDE 2 MEQ/ML IV SOLN
80.0000 meq | INTRAVENOUS | Status: DC
  Filled 2023-07-29: qty 40

## 2023-07-29 MED ORDER — CHLORHEXIDINE GLUCONATE 4 % EX SOLN
1.0000 | Freq: Once | CUTANEOUS | Status: AC
  Administered 2023-07-30: 1 via TOPICAL
  Filled 2023-07-29: qty 15

## 2023-07-29 MED ORDER — CHLORHEXIDINE GLUCONATE 0.12 % MT SOLN
15.0000 mL | Freq: Once | OROMUCOSAL | Status: AC
  Administered 2023-07-30: 15 mL via OROMUCOSAL
  Filled 2023-07-29: qty 15

## 2023-07-29 MED ORDER — BISACODYL 5 MG PO TBEC
5.0000 mg | DELAYED_RELEASE_TABLET | Freq: Once | ORAL | Status: AC
  Administered 2023-07-29: 5 mg via ORAL
  Filled 2023-07-29: qty 1

## 2023-07-29 NOTE — Evaluation (Signed)
 Occupational Therapy Evaluation Patient Details Name: [name] Daniel MRN: [id number] DOB: Oct 07, 1955 Today's Date: 07/29/2023   History of Present Illness   68 yo male admitted 2/13 after presenting to hernia repair followup visit with SOB, LB edema, CHF exacerbation. 2/17 RHC/LHC. IABP 2/17-2/19. [facility] 2/17-2/21. PMhx: TAA, HFrEF, severe AS, CM, T2DM, CKD, 12/24 hernia repair     Clinical Impressions PTA, pt lives alone and reports typically Independent with ADLs, household IADLs and mobility without AD. Pt presents now with deficits in endurance, dynamic standing balance and strength. Pt requires CGA-Min A for hallway mobility with RW to correct balance and maneuver DME. Pt requires no more than Min A for ADL completion. As pt lives alone and below functional baseline, recommend consideration of continued inpatient follow up therapy, <3 hours/day at DC. Will finalize DC recommendations post op.   VSS on RA     If plan is discharge home, recommend the following:   A little help with walking and/or transfers;A little help with bathing/dressing/bathroom;Assistance with cooking/housework;Assist for transportation     Functional Status Assessment   Patient has had a recent decline in their functional status and demonstrates the ability to make significant improvements in function in a reasonable and predictable amount of time.     Equipment Recommendations   None recommended by OT     Recommendations for Other Services         Precautions/Restrictions   Precautions Precautions: Fall Restrictions Weight Bearing Restrictions Per Provider Order: No     Mobility Bed Mobility               General bed mobility comments: long sitting in bed on entry, able to swing LE off of bed without issues    Transfers Overall transfer level: Needs assistance Equipment used: Rolling walker (2 wheels), None Transfers: Sit to/from Stand Sit to Stand: Contact guard  assist                  Balance Overall balance assessment: Needs assistance Sitting-balance support: No upper extremity supported, Feet supported Sitting balance-Leahy Scale: Good     Standing balance support: Bilateral upper extremity supported, During functional activity, No upper extremity supported Standing balance-Leahy Scale: Fair                             ADL either performed or assessed with clinical judgement   ADL Overall ADL's : Needs assistance/impaired Eating/Feeding: Independent   Grooming: Supervision/safety;Standing;Oral care Grooming Details (indicate cue type and reason): cues to sequence task, mildly unsteady without UE support standing at sink. Upper Body Bathing: Set up   Lower Body Bathing: Minimal assistance;Sit to/from stand;Sitting/lateral leans   Upper Body Dressing : Set up;Sitting   Lower Body Dressing: Minimal assistance;Sitting/lateral leans;Sit to/from stand   Toilet Transfer: Minimal assistance;Ambulation;Rolling walker (2 wheels)   Toileting- Clothing Manipulation and Hygiene: Minimal assistance;Sitting/lateral lean;Sit to/from stand       Functional mobility during ADLs: Minimal assistance;Contact guard assist;Rolling walker (2 wheels) General ADL Comments: some assist for balance w/ mobility and manuevering RW out in hallway; some slower processing w/ ADLs and cues to complete.     Vision Ability to See in Adequate Light: 0 Adequate Patient Visual Report: No change from baseline Vision Assessment?: No apparent visual deficits     Perception         Praxis         Pertinent Vitals/Pain Pain Assessment Pain Assessment:  Faces Faces Pain Scale: Hurts a little bit Pain Location: BLE Pain Descriptors / Indicators: Cramping Pain Intervention(s): Monitored during session, Limited activity within patient's tolerance     Extremity/Trunk Assessment Upper Extremity Assessment Upper Extremity Assessment: Overall  WFL for tasks assessed;Right hand dominant   Lower Extremity Assessment Lower Extremity Assessment: Defer to PT evaluation   Cervical / Trunk Assessment Cervical / Trunk Assessment: Normal   Communication Communication Communication: No apparent difficulties   Cognition Arousal: Alert Behavior During Therapy: WFL for tasks assessed/performed Cognition: Cognition impaired     Awareness: Intellectual awareness intact, Online awareness impaired Memory impairment (select all impairments): Working Civil [employer] fast streamer, Conservation officer, historic buildings Attention impairment (select first level of impairment): Selective attention Executive functioning impairment (select all impairments): Initiation, Organization, Problem solving, Reasoning                   Following commands: Intact       Cueing  General Comments   Cueing Techniques: Verbal cues;Tactile cues      Exercises     Shoulder Instructions      Home Living Family/patient expects to be discharged to:: Private residence Living Arrangements: Alone Available Help at Discharge: Family;Available PRN/intermittently Type of Home: Apartment Home Access: Level entry     Home Layout: One level     Bathroom Shower/Tub: Producer, television/film/video: Standard Bathroom Accessibility: Yes   Home Equipment: Agricultural consultant (2 wheels)          Prior Functioning/Environment Prior Level of Function : Independent/Modified Independent             Mobility Comments: no AD ADLs Comments: reports Indep with ADLs, IADLs in the home; does not report any hobbies. Sister assists with transportation as needed    OT Problem List: Decreased activity tolerance;Impaired balance (sitting and/or standing);Decreased cognition;Decreased knowledge of use of DME or AE   OT Treatment/Interventions: Self-care/ADL training;Therapeutic exercise;Energy conservation;DME and/or AE instruction;Therapeutic activities;Patient/family education;Balance  training      OT Goals(Current goals can be found in the care plan section)   Acute Rehab OT Goals Patient Stated Goal: agreeable for rehab stay if needed OT Goal Formulation: With patient Time For Goal Achievement: 08/12/23 Potential to Achieve Goals: Good   OT Frequency:  Min 1X/week    Co-evaluation              AM-PAC OT "6 Clicks" Daily Activity     Outcome Measure Help from another person eating meals?: None Help from another person taking care of personal grooming?: A Little Help from another person toileting, which includes using toliet, bedpan, or urinal?: A Little Help from another person bathing (including washing, rinsing, drying)?: A Little Help from another person to put on and taking off regular upper body clothing?: A Little Help from another person to put on and taking off regular lower body clothing?: A Little 6 Click Score: 19   End of Session Equipment Utilized During Treatment: Gait belt;Rolling walker (2 wheels) Nurse Communication: Mobility status  Activity Tolerance: Patient tolerated treatment well Patient left: in chair;with call bell/phone within reach  OT Visit Diagnosis: Unsteadiness on feet (R26.81);Other abnormalities of gait and mobility (R26.89)                Time: 4098-1191 OT Time Calculation (min): 29 min Charges:  OT General Charges $OT Visit: 1 Visit OT Evaluation $OT Eval Moderate Complexity: 1 Mod OT Treatments $Self Care/Home Management : 8-22 mins  Raynelle Fanning B, OTR/L Acute Rehab  Services Office: [phone]   Lorre Munroe 07/29/2023, 11:12 AM

## 2023-07-29 NOTE — Progress Notes (Signed)
 Patient ID: [name] Daniel, male   DOB: 1955/11/25, 68 y.o.   MRN: [id number]     Advanced Heart Failure Rounding Note  Cardiologist: Dina Rich, MD  Chief Complaint: Cardiogenic Shock. Subjective:   2/17 Transferred ICU from cath lab. LHC nonobstructive CAD. IABP placed and continued on milrinone. Due to low CO/CI switched to DBA. CT Surgery/Structural Team consulted for aortic stenosis. 2/19: IABP removed.   UA positive. CT scan suggests RLL PNA. PCT < 0.10 Zosyn started 2/22  Remains on DBA 2.5 Co-ox 67%   CVP 11-12 this morning.   Feels fine this morning. Sitting up in bed.   Objective:   Weight Range: 68.2 kg Body mass index is 19.84 kg/m.   Vital Signs:   Temp:  [97.6 F (36.4 C)-98.3 F (36.8 C)] 97.7 F (36.5 C) (02/24 0800) Pulse Rate:  [97-118] 107 (02/24 1000) Resp:  [7-36] 16 (02/24 1000) BP: (69-104)/(57-90) 104/90 (02/24 1000) SpO2:  [77 %-100 %] 95 % (02/24 1000) Weight:  [68.2 kg] 68.2 kg (02/24 0500) Last BM Date : 07/28/23  Weight change: Filed Weights   07/27/23 0500 07/28/23 0500 07/29/23 0500  Weight: 65.6 kg 64.9 kg 68.2 kg   Intake/Output:   Intake/Output Summary (Last 24 hours) at 07/29/2023 1047 Last data filed at 07/29/2023 1000 Gross per 24 hour  Intake 454.1 ml  Output 2250 ml  Net -1795.9 ml    Physical Exam   General:  frail/elderly appearing.  No respiratory difficulty HEENT: normal Neck: supple. JVD ~10 cm.  Cor: PMI nondisplaced. Regular rate & rhythm. No rubs, gallops. 3/6 AS Lungs: clear Abdomen: soft, nontender, nondistended. Good bowel sounds. Extremities: no cyanosis, clubbing, rash, edema  Neuro: alert & oriented x 3. Moves all 4 extremities w/o difficulty. Affect flat.   Telemetry   ST 110s (Personally reviewed)    Labs   CBC Recent Labs    07/27/23 0637 07/28/23 0503  WBC 4.6 5.4  HGB 12.5* 12.8*  HCT 37.7* 38.5*  MCV 94.5 94.1  PLT 116* 139*   Basic Metabolic Panel Recent Labs    78/29/56 0503  07/29/23 0500  NA 134* 135  K 4.3 4.1  CL 91* 97*  CO2 28 29  GLUCOSE 127* 113*  BUN 28* 28*  CREATININE 1.16 1.16  CALCIUM 8.6* 8.7*  MG 2.1 2.1   Liver Function Tests No results for input(s): "AST", "ALT", "ALKPHOS", "BILITOT", "PROT", "ALBUMIN" in the last 72 hours.  No results for input(s): "LIPASE", "AMYLASE" in the last 72 hours. Cardiac Enzymes No results for input(s): "CKTOTAL", "CKMB", "CKMBINDEX", "TROPONINI" in the last 72 hours.  BNP: BNP (last 3 results) Recent Labs    06/03/23 2152 07/18/23 1609  BNP 3,234.0* >4,500.0*   ProBNP (last 3 results) No results for input(s): "PROBNP" in the last 8760 hours.  D-Dimer No results for input(s): "DDIMER" in the last 72 hours. Hemoglobin A1C No results for input(s): "HGBA1C" in the last 72 hours.  Fasting Lipid Panel No results for input(s): "CHOL", "HDL", "LDLCALC", "TRIG", "CHOLHDL", "LDLDIRECT" in the last 72 hours. Thyroid Function Tests No results for input(s): "TSH", "T4TOTAL", "T3FREE", "THYROIDAB" in the last 72 hours.  Invalid input(s): "FREET3"  Other results:  Imaging   No results found.    Medications:   Scheduled Medications:  aspirin EC  81 mg Oral Daily   Chlorhexidine Gluconate Cloth  6 each Topical Daily   cyanocobalamin  1,000 mcg Oral Daily   digoxin  0.125 mg Oral Daily  enoxaparin (LOVENOX) injection  40 mg Subcutaneous Q24H   feeding supplement  237 mL Oral TID BM   folic acid  1 mg Oral Daily   multivitamin with minerals  1 tablet Oral Daily   rosuvastatin  20 mg Oral Daily   sodium chloride flush  10-40 mL Intracatheter Q12H   sodium chloride flush  3 mL Intravenous Q12H   thiamine  100 mg Oral Daily   traZODone  100 mg Oral QHS    Infusions:  DOBUTamine 2.5 mcg/kg/min (07/29/23 1000)    PRN Medications: acetaminophen, magnesium hydroxide, ondansetron (ZOFRAN) IV, mouth rinse, oxyCODONE, sodium chloride flush, sodium chloride flush, traMADol  Assessment/Plan   1.  Acute on chronic systolic CHF/cardiogenic shock: Echo in 12/24 showed EF 20%, mild RV dysfunction, severe low flow/low gradient aortic stenosis with mean gradient 35 mmHg and AVA 0.49 cm^2. 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  He has severe aortic stenosis.  He also has anterior Qs on ECG so prior MI is certainly possible. He was re-admitted with marked volume overload and low cardiac output, co-ox 38%.  He was started on milrinone which was increased to 0.375 and diuresed with lasix drip. - 2/17 cath - IABP placed and continued on Milrinone. Due to low CO/CI he was switched to Dobutamine. 2/19 IABP removed. - Now on DBA in setting of severe LV dysfunction and severe AS - On DBA 2.5 Co-ox 67% Volume status elevated this morning. CVP 11-12. Will give 80 IV x1 for pre-op optimization. - Continue DBA to support until TAVR - Decrease spiro 25>12.5 mg daily with soft BP.  - Continue digoxin 0.125 daily.  - No b-blocker with shock  2. Elevated troponin: Mild elevation with no trend. Suspect demand ischemia with volume overload.  - LHC 2/17 with nonobstructive CAD - Continue statin - Continue ASA 81 - No s/s angina  3. Aortic stenosis: Low flow/low gradient severe AS on 12/24 echo. This contributes to CHF.  He will ultimately need the aortic valve replaced.  - CT Surgery/Structural Team following. - TAVR CTs done 07/25/23. - Plan for high-risk TAVR tomorrow. Tuesday 2/25.  4. Thrombocytopenia: Platelets have been mildly low since 12/24.  Follow.  - PLT count 116K -> 139K  5. Ascending aortic aneurysm: 5.0 cm on 12/24 CTA chest.  - not surgical candidate  6. H/o ETOH abuse: Not very forthcoming on current drinking habits but apparently has quit for about 6 months.   7. Deconditioning - PT/OT following - Ambulate today  8. Hyponatremia - Resolved  9. + UTI and RLL PNA on CT - PCT < 0.10 - Zosyn started 2/22. Last dose today.   CRITICAL CARE Performed by: Alen Bleacher  Total critical  care time: 15 minutes  Critical care time was exclusive of separately billable procedures and treating other patients.  Critical care was necessary to treat or prevent imminent or life-threatening deterioration.  Critical care was time spent personally by me on the following activities: development of treatment plan with patient and/or surrogate as well as nursing, discussions with consultants, evaluation of patient's response to treatment, examination of patient, obtaining history from patient or surrogate, ordering and performing treatments and interventions, ordering and review of laboratory studies, ordering and review of radiographic studies, pulse oximetry and re-evaluation of patient's condition.   Length of Stay: [address] NP  07/29/2023, 10:47 AM  Advanced Heart Failure Team Pager [phone] (M-F; 7a - 5p)

## 2023-07-29 NOTE — Progress Notes (Signed)
  [facility]   MULTIDISCIPLINARY HEART VALVE TEAM  Plan for TAVR-TF with Dr. Excell Seltzer and Dr. Leafy Ro tomorrow 07/30/23. Inpatient pre op cardiac surgery orders placed. NPO after midnight.   Cline Crock PA-C  [facility]

## 2023-07-30 ENCOUNTER — Inpatient Hospital Stay (HOSPITAL_COMMUNITY): Payer: Self-pay | Admitting: Certified Registered Nurse Anesthetist

## 2023-07-30 ENCOUNTER — Encounter (HOSPITAL_COMMUNITY)
Admission: EM | Disposition: A | Discharge status: Home Health | Payer: Self-pay | Source: Home / Self Care | Attending: Internal Medicine

## 2023-07-30 ENCOUNTER — Inpatient Hospital Stay (HOSPITAL_COMMUNITY): Payer: Medicare Other

## 2023-07-30 ENCOUNTER — Encounter (HOSPITAL_COMMUNITY): Payer: Self-pay | Admitting: Internal Medicine

## 2023-07-30 DIAGNOSIS — Z006 Encounter for examination for normal comparison and control in clinical research program: Secondary | ICD-10-CM

## 2023-07-30 DIAGNOSIS — I509 Heart failure, unspecified: Secondary | ICD-10-CM

## 2023-07-30 DIAGNOSIS — Z952 Presence of prosthetic heart valve: Secondary | ICD-10-CM

## 2023-07-30 DIAGNOSIS — E119 Type 2 diabetes mellitus without complications: Secondary | ICD-10-CM

## 2023-07-30 DIAGNOSIS — I350 Nonrheumatic aortic (valve) stenosis: Secondary | ICD-10-CM

## 2023-07-30 HISTORY — PX: INTRAOPERATIVE TRANSTHORACIC ECHOCARDIOGRAM: SHX6523

## 2023-07-30 HISTORY — DX: Presence of prosthetic heart valve: Z95.2

## 2023-07-30 LAB — POCT I-STAT 7, (LYTES, BLD GAS, ICA,H+H)
Acid-Base Excess: 10 mmol/L — ABNORMAL HIGH (ref 0.0–2.0)
Bicarbonate: 33.6 mmol/L — ABNORMAL HIGH (ref 20.0–28.0)
Calcium, Ion: 1.13 mmol/L — ABNORMAL LOW (ref 1.15–1.40)
HCT: 38 % — ABNORMAL LOW (ref 39.0–52.0)
Hemoglobin: 12.9 g/dL — ABNORMAL LOW (ref 13.0–17.0)
O2 Saturation: 96 %
Potassium: 3.9 mmol/L (ref 3.5–5.1)
Sodium: 135 mmol/L (ref 135–145)
TCO2: 35 mmol/L — ABNORMAL HIGH (ref 22–32)
pCO2 arterial: 40.5 mm[Hg] (ref 32–48)
pH, Arterial: 7.528 — ABNORMAL HIGH (ref 7.35–7.45)
pO2, Arterial: 73 mm[Hg] — ABNORMAL LOW (ref 83–108)

## 2023-07-30 LAB — ECHOCARDIOGRAM LIMITED
AR max vel: 0.95 cm2
AV Area VTI: 1.01 cm2
AV Area mean vel: 1.1 cm2
AV Mean grad: 18 mm[Hg]
AV Peak grad: 28.3 mm[Hg]
Ao pk vel: 2.66 m/s
Height: 73 in
S' Lateral: 5 cm
Weight: 2285.73 [oz_av]

## 2023-07-30 LAB — COMPREHENSIVE METABOLIC PANEL
ALT: 30 U/L (ref 0–44)
AST: 35 U/L (ref 15–41)
Albumin: 3 g/dL — ABNORMAL LOW (ref 3.5–5.0)
Alkaline Phosphatase: 57 U/L (ref 38–126)
Anion gap: 10 (ref 5–15)
BUN: 31 mg/dL — ABNORMAL HIGH (ref 8–23)
CO2: 32 mmol/L (ref 22–32)
Calcium: 8.8 mg/dL — ABNORMAL LOW (ref 8.9–10.3)
Chloride: 94 mmol/L — ABNORMAL LOW (ref 98–111)
Creatinine, Ser: 1.06 mg/dL (ref 0.61–1.24)
GFR, Estimated: >60 mL/min (ref >60)
Glucose, Bld: 141 mg/dL — ABNORMAL HIGH (ref 70–99)
Potassium: 3.7 mmol/L (ref 3.5–5.1)
Sodium: 136 mmol/L (ref 135–145)
Total Bilirubin: 0.8 mg/dL (ref 0.0–1.2)
Total Protein: 6.1 g/dL — ABNORMAL LOW (ref 6.5–8.1)

## 2023-07-30 LAB — BLOOD GAS, ARTERIAL
Acid-Base Excess: 8.3 mmol/L — ABNORMAL HIGH (ref 0.0–2.0)
Bicarbonate: 34 mmol/L — ABNORMAL HIGH (ref 20.0–28.0)
O2 Saturation: 84.2 %
Patient temperature: 37
pCO2 arterial: 50 mm[Hg] — ABNORMAL HIGH (ref 32–48)
pH, Arterial: 7.44 (ref 7.35–7.45)
pO2, Arterial: 51 mm[Hg] — ABNORMAL LOW (ref 83–108)

## 2023-07-30 LAB — SURGICAL PCR SCREEN
MRSA, PCR: NEGATIVE
Staphylococcus aureus: NEGATIVE

## 2023-07-30 LAB — COOXEMETRY PANEL
Carboxyhemoglobin: 1.9 % — ABNORMAL HIGH (ref 0.5–1.5)
Methemoglobin: <0.7 % (ref 0.0–1.5)
O2 Saturation: 64.9 %
Total hemoglobin: 12.6 g/dL (ref 12.0–16.0)

## 2023-07-30 LAB — CBC
HCT: 37 % — ABNORMAL LOW (ref 39.0–52.0)
Hemoglobin: 12.3 g/dL — ABNORMAL LOW (ref 13.0–17.0)
MCH: 30.9 pg (ref 26.0–34.0)
MCHC: 33.2 g/dL (ref 30.0–36.0)
MCV: 93 fL (ref 80.0–100.0)
Platelets: 175 10*3/uL (ref 150–400)
RBC: 3.98 MIL/uL — ABNORMAL LOW (ref 4.22–5.81)
RDW: 16.2 % — ABNORMAL HIGH (ref 11.5–15.5)
WBC: 4.4 10*3/uL (ref 4.0–10.5)
nRBC: 0 % (ref 0.0–0.2)

## 2023-07-30 LAB — MAGNESIUM: Magnesium: 2.2 mg/dL (ref 1.7–2.4)

## 2023-07-30 SURGERY — TRANSCATHETER AORTIC VALVE REPLACEMENT, TRANSFEMORAL (CATHLAB)
Anesthesia: Monitor Anesthesia Care

## 2023-07-30 MED ORDER — POTASSIUM CHLORIDE CRYS ER 20 MEQ PO TBCR
40.0000 meq | EXTENDED_RELEASE_TABLET | Freq: Once | ORAL | Status: AC
  Administered 2023-07-30: 40 meq via ORAL
  Filled 2023-07-30: qty 2

## 2023-07-30 MED ORDER — LACTATED RINGERS IV SOLN: INTRAVENOUS | Status: DC | PRN

## 2023-07-30 MED ORDER — HEPARIN (PORCINE) IN NACL 1000-0.9 UT/500ML-% IV SOLN
INTRAVENOUS | Status: DC | PRN
  Administered 2023-07-30 (×2): 500 mL

## 2023-07-30 MED ORDER — PROTAMINE SULFATE 10 MG/ML IV SOLN
INTRAVENOUS | Status: DC | PRN
Start: 2023-07-30 — End: 2023-07-30
  Administered 2023-07-30: 95 mg via INTRAVENOUS

## 2023-07-30 MED ORDER — IODIXANOL 320 MG/ML IV SOLN
INTRAVENOUS | Status: DC | PRN
  Administered 2023-07-30: 40 mL

## 2023-07-30 MED ORDER — MIDAZOLAM HCL 2 MG/2ML IJ SOLN
INTRAMUSCULAR | Status: AC
  Filled 2023-07-30: qty 2

## 2023-07-30 MED ORDER — LIDOCAINE HCL (PF) 1 % IJ SOLN
INTRAMUSCULAR | Status: DC | PRN
  Administered 2023-07-30 (×2): 10 mL

## 2023-07-30 MED ORDER — HEPARIN SODIUM (PORCINE) 1000 UNIT/ML IJ SOLN
INTRAMUSCULAR | Status: DC | PRN
  Administered 2023-07-30: 1000 [IU] via INTRAVENOUS

## 2023-07-30 MED ORDER — MIDAZOLAM HCL 2 MG/2ML IJ SOLN
INTRAMUSCULAR | Status: DC | PRN
  Administered 2023-07-30 (×4): .5 mg via INTRAVENOUS

## 2023-07-30 MED ORDER — HEPARIN SODIUM (PORCINE) 1000 UNIT/ML IJ SOLN
INTRAMUSCULAR | Status: DC | PRN
Start: 2023-07-30 — End: 2023-07-30
  Administered 2023-07-30: 9500 [IU] via INTRAVENOUS

## 2023-07-30 SURGICAL SUPPLY — 35 items
BAG SNAP BAND KOVER 36X36 (MISCELLANEOUS) ×2 IMPLANT
BALLN TRUE 20X4.5 (BALLOONS) ×1 IMPLANT
BALLOON TRUE 20X4.5 (BALLOONS) IMPLANT
CATH COMMANDER DELIVERY SYS 29 (CATHETERS) IMPLANT
CATH DIAG 6FR PIGTAIL ANGLED (CATHETERS) IMPLANT
CATH INFINITI 5FR ANG PIGTAIL (CATHETERS) IMPLANT
CATH INFINITI 6F AL2 (CATHETERS) IMPLANT
CATH INFINITI 6F MPA2 100CM (CATHETERS) IMPLANT
CATH LAUNCHER 6FR AL3 (CATHETERS) IMPLANT
CATH S G BIP PACING (CATHETERS) IMPLANT
CLOSURE MYNX CONTROL 6F/7F (Vascular Products) IMPLANT
CLOSURE PERCLOSE PROSTYLE (VASCULAR PRODUCTS) IMPLANT
CRIMPER (MISCELLANEOUS) IMPLANT
DEVICE INFLATION ATRION QL38 (MISCELLANEOUS) IMPLANT
KIT HEMO VALVE WATCHDOG (MISCELLANEOUS) IMPLANT
KIT MICROPUNCTURE NIT STIFF (SHEATH) IMPLANT
KIT SAPIAN 3 ULTRA RESILIA 29 (Valve) IMPLANT
PACK CARDIAC CATHETERIZATION (CUSTOM PROCEDURE TRAY) ×1 IMPLANT
PROTECTION STATION PRESSURIZED (MISCELLANEOUS) ×1 IMPLANT
SET ATX-X65L (MISCELLANEOUS) IMPLANT
SHEATH BRITE TIP 7FR 35CM (SHEATH) IMPLANT
SHEATH INTRODUCER SET 29 (SHEATH) IMPLANT
SHEATH PINNACLE 5F 10CM (SHEATH) IMPLANT
SHEATH PINNACLE 6F 10CM (SHEATH) IMPLANT
SHEATH PINNACLE 8F 10CM (SHEATH) IMPLANT
SHEATH PROBE COVER 6X72 (BAG) IMPLANT
SHIELD CATH-GARD CONTAMINATION (MISCELLANEOUS) IMPLANT
STATION PROTECTION PRESSURIZED (MISCELLANEOUS) IMPLANT
STOPCOCK MORSE 400PSI 3WAY (MISCELLANEOUS) ×2 IMPLANT
TUBING ART PRESS 72 MALE/FEM (TUBING) IMPLANT
WIRE AMPLATZ SS-J .035X180CM (WIRE) IMPLANT
WIRE EMERALD 3MM-J .035X150CM (WIRE) IMPLANT
WIRE EMERALD 3MM-J .035X260CM (WIRE) IMPLANT
WIRE EMERALD ST .035X260CM (WIRE) IMPLANT
WIRE SAFARI SM CURVE 275 (WIRE) IMPLANT

## 2023-07-30 NOTE — Anesthesia Preprocedure Evaluation (Signed)
 Anesthesia Evaluation  Patient identified by MRN, date of birth, ID band Patient awake    Reviewed: Allergy & Precautions, NPO status , Patient's Chart, lab work & pertinent test results, reviewed documented beta blocker date and time   Airway Mallampati: I  TM Distance: >3 FB Neck ROM: Full    Dental  (+) Poor Dentition, Missing, Loose, Chipped   Pulmonary former smoker    + decreased breath sounds      Cardiovascular +CHF   Rhythm:Regular Rate:Tachycardia + Systolic murmurs Echo:   1. Left ventricular ejection fraction, by estimation, is 20%. The left  ventricle has severely decreased function. The left ventricle demonstrates  global hypokinesis. There is mild left ventricular hypertrophy. Left  ventricular diastolic parameters are  indeterminate.   2. Right ventricular systolic function is mildly reduced. The right  ventricular size is normal. There is mildly elevated pulmonary artery  systolic pressure.   3. Left atrial size was mildly dilated.   4. Right atrial size was mildly dilated.   5. Mild to moderate. The pericardial effusion is localized near the right  atrium.   6. The mitral valve is abnormal. Mild mitral valve regurgitation. No  evidence of mitral stenosis.   7. The tricuspid valve is abnormal.   8. Severe low flow low gradient aortic stenosis. AVA VTI 0.49, mean  gradient 29 mmHg DI 0.16. Highest Pedhoff mean gradient is 35 mmHg in  setting of ectopy leading to some beat to beat variation in gradient. .  The aortic valve has an indeterminant  number of cusps. There is severe calcifcation of the aortic valve. There  is severe thickening of the aortic valve. Aortic valve regurgitation is  not visualized.   9. The inferior vena cava is dilated in size with >50% respiratory  variability, suggesting right atrial pressure of 8 mmHg.     Neuro/Psych negative neurological ROS  negative psych ROS    GI/Hepatic negative GI ROS, Neg liver ROS,,,  Endo/Other  diabetes    Renal/GU Renal disease     Musculoskeletal negative musculoskeletal ROS (+)    Abdominal   Peds  Hematology negative hematology ROS (+)   Anesthesia Other Findings   Reproductive/Obstetrics                             Anesthesia Physical Anesthesia Plan  ASA: 4  Anesthesia Plan: MAC   Post-op Pain Management: Minimal or no pain anticipated   Induction: Intravenous  PONV Risk Score and Plan: 0 and Propofol infusion  Airway Management Planned: Natural Airway and Nasal Cannula  Additional Equipment: Arterial line  Intra-op Plan:   Post-operative Plan:   Informed Consent: I have reviewed the patients History and Physical, chart, labs and discussed the procedure including the risks, benefits and alternatives for the proposed anesthesia with the patient or authorized representative who has indicated his/her understanding and acceptance.       Plan Discussed with: CRNA  Anesthesia Plan Comments:        Anesthesia Quick Evaluation

## 2023-07-30 NOTE — Anesthesia Postprocedure Evaluation (Signed)
 Anesthesia Post Note  Patient: [name] Daniel  Procedure(s) Performed: Transcatheter Aortic Valve Replacement, Transfemoral INTRAOPERATIVE TRANSTHORACIC ECHOCARDIOGRAM     Patient location during evaluation: PACU Anesthesia Type: MAC Level of consciousness: awake and alert Pain management: pain level controlled Vital Signs Assessment: post-procedure vital signs reviewed and stable Respiratory status: spontaneous breathing, nonlabored ventilation, respiratory function stable and patient connected to nasal cannula oxygen Cardiovascular status: stable and blood pressure returned to baseline Postop Assessment: no apparent nausea or vomiting Anesthetic complications: no  There were no known notable events for this encounter.  Last Vitals:  Vitals:   07/30/23 1830 07/30/23 1845  BP:    Pulse: 84 84  Resp: 18 20  Temp:    SpO2: 95% 94%    Last Pain:  Vitals:   07/30/23 1800  TempSrc:   PainSc: 0-No pain                 Shelton Silvas

## 2023-07-30 NOTE — Transfer of Care (Signed)
 Immediate Anesthesia Transfer of Care Note  Patient: [name] Daniel  Procedure(s) Performed: Transcatheter Aortic Valve Replacement, Transfemoral INTRAOPERATIVE TRANSTHORACIC ECHOCARDIOGRAM  Patient Location: SICU  Anesthesia Type:MAC  Level of Consciousness: awake, alert , and oriented  Airway & Oxygen Therapy: Patient Spontanous Breathing and Patient connected to nasal cannula oxygen  Post-op Assessment: Report given to RN, Post -op Vital signs reviewed and stable, Patient moving all extremities X 4, and Patient able to stick tongue midline  Post vital signs: Reviewed and stable  Last Vitals:  Vitals Value Taken Time  BP 118/64   Temp 98.6   Pulse 90   Resp 12   SpO2 93     Last Pain:  Vitals:   07/30/23 1400  TempSrc:   PainSc: 3       Patients Stated Pain Goal: 3 (07/29/23 2000)  Complications: There were no known notable events for this encounter.

## 2023-07-30 NOTE — Interval H&P Note (Signed)
 History and Physical Interval Note:  07/30/2023 11:59 AM  [name] Daniel  has presented today for surgery, with the diagnosis of Severe Aortic Stenosis.  The various methods of treatment have been discussed with the patient and family. After consideration of risks, benefits and other options for treatment, the patient has consented to  Procedure(s): Transcatheter Aortic Valve Replacement, Transfemoral (N/A) INTRAOPERATIVE TRANSTHORACIC ECHOCARDIOGRAM (N/A) as a surgical intervention.  The patient's history has been reviewed, patient examined, no change in status, stable for surgery.  I have reviewed the patient's chart and labs.  Questions were answered to the patient's satisfaction.     Eugenio Hoes

## 2023-07-30 NOTE — Op Note (Signed)
 [facility]   MULTIDISCIPLINARY HEART VALVE TEAM   TAVR OPERATIVE NOTE   Date of Procedure:  07/30/2023  Preoperative Diagnosis: Severe Aortic Stenosis   Postoperative Diagnosis: Same   Procedure:   Transcatheter Aortic Valve Replacement - Percutaneous left Transfemoral Approach  Edwards Sapien 3 Ultra THV (size 29 mm, model # [id number], )   Co-Surgeons:  Eugenio Hoes MD and Tonny Bollman, MD    Anesthesiologist:  Shona Simpson MD  Echocardiographer:  Charlton Haws MD  Pre-operative Echo Findings: Severe aortic stenosis Severely depressed left ventricular systolic function  Post-operative Echo Findings: triavial paravalvular leak Severely depressed left ventricular systolic function   BRIEF CLINICAL NOTE AND INDICATIONS FOR SURGERY  Pt is in difficult situation with very poor LV function secondary to severe AS (bicuspid) with associated ascending aortic aneurysm. Pt has been evaluated by Dr Laneta Simmers last week and was turned down as a surgical candidate. He however is on dobutamine and only opportunity for survival would be TAVR. However this is at increased risk and will require the use of the Sapien valve with increased volume to provide success. He may require mechanical support to have the valve implanted but is not a surgical candidate for open replacement. This was discussed with the patient and he appears to understand     DETAILS OF THE OPERATIVE PROCEDURE  PREPARATION:    The patient was brought to the operating room on the above mentioned date and appropriate monitoring was established by the anesthesia team. The patient was placed in the supine position on the operating table.  Intravenous antibiotics were administered. The patient was monitored closely throughout the procedure under conscious sedation.    Baseline transthoracic echocardiogram was performed. The patient's abdomen and both groins were prepped and draped in a sterile manner. A time out  procedure was performed.   PERIPHERAL ACCESS:    Using the modified Seldinger technique, femoral arterial and venous access was obtained with placement of 6 Fr sheaths on the right side.  A pigtail diagnostic catheter was passed through the right arterial sheath under fluoroscopic guidance into the aortic root.  A temporary transvenous pacemaker catheter was passed through the right femoral venous sheath under fluoroscopic guidance into the right ventricle.  The pacemaker was tested to ensure stable lead placement and pacemaker capture. Aortic root angiography was performed in order to determine the optimal angiographic angle for valve deployment.   TRANSFEMORAL ACCESS:   Percutaneous transfemoral access and sheath placement was performed using ultrasound guidance.  The left common femoral artery was cannulated using a micropuncture needle and appropriate location was verified using hand injection angiogram.  A pair of Abbott Perclose percutaneous closure devices were placed and a 6 French sheath replaced into the femoral artery.  The patient was heparinized systemically and ACT verified > 250 seconds.    A 16 Fr transfemoral E-sheath was introduced into the left common femoral artery after progressively dilating over an Amplatz superstiff wire. An AL3 catheter was used to direct a straight-tip exchange length wire across the native aortic valve into the left ventricle. This was exchanged out for a pigtail catheter and position was confirmed in the LV apex. Simultaneous LV and Ao pressures were recorded.LVEDP was 32 mmHg  The pigtail catheter was exchanged for a Safari wire in the LV apex.   BALLOON AORTIC VALVULOPLASTY:   Balloon aortic valvuloplasty was performed using a 20 mm valvuloplasty balloon.  Once optimal position was achieved, BAV was done under rapid ventricular pacing.  The patient recovered well hemodynamically.    TRANSCATHETER HEART VALVE DEPLOYMENT:   An Edwards Sapien 3 Ultra  transcatheter heart valve (size 29 mm) was prepared and crimped per manufacturer's guidelines, and the proper orientation of the valve is confirmed on the [employer] delivery system. The valve was advanced through the introducer sheath using normal technique until in an appropriate position in the abdominal aorta beyond the sheath tip. The balloon was then retracted and using the fine-tuning wheel was centered on the valve. The valve was then advanced across the aortic arch using appropriate flexion of the catheter. The valve was carefully positioned across the aortic valve annulus. The Commander catheter was retracted using normal technique. Once final position of the valve has been confirmed by angiographic assessment, the valve is deployed during rapid ventricular pacing to maintain systolic blood pressure < 50 mmHg and pulse pressure < 10 mmHg. The balloon inflation is held for >3 seconds after reaching full deployment volume with an additional 4 cc of volume Once the balloon has fully deflated the balloon is retracted into the ascending aorta and valve function is assessed using echocardiography. There is felt to be trivial paravalvular leak and no central aortic insufficiency.  The patient's hemodynamic recovery following valve deployment is good.  The deployment balloon and guidewire are both removed.    PROCEDURE COMPLETION:   The sheath was removed and femoral artery closure performed.  Protamine was administered once femoral arterial repair was complete. The temporary pacemaker, pigtail catheter and femoral sheaths were removed with manual pressure used for venous hemostasis.  A Mynx femoral closure device was utilized following removal of the diagnostic sheath in the minx femoral artery.  The patient tolerated the procedure well and is transported to the cath lab recovery area in stable condition. There were no immediate intraoperative complications. All sponge instrument and needle counts  are verified correct at completion of the operation.   No blood products were administered during the operation.  The patient received a total of 40 mL of intravenous contrast during the procedure.   Eugenio Hoes, MD 07/30/2023 5:42 PM

## 2023-07-30 NOTE — Progress Notes (Signed)
 Patient ID: [name] Daniel, male   DOB: 1955/06/16, 68 y.o.   MRN: [id number]     Advanced Heart Failure Rounding Note  Cardiologist: Dina Rich, MD  Chief Complaint: Cardiogenic Shock. Subjective:   2/17 Transferred ICU from cath lab. LHC nonobstructive CAD. IABP placed and continued on milrinone. Due to low CO/CI switched to DBA. CT Surgery/Structural Team consulted for aortic stenosis. 2/19: IABP removed.   UA positive. CT scan suggests RLL PNA. PCT < 0.10 Zosyn started 2/22  Remains on DBA 2.5 Co-ox 65%   Resting in bed. NPO fro TAVR later today.   Objective:   Weight Range: 64.8 kg Body mass index is 18.85 kg/m.   Vital Signs:   Temp:  [97.8 F (36.6 C)-98.5 F (36.9 C)] 97.8 F (36.6 C) (02/25 0748) Pulse Rate:  [101-116] 107 (02/25 0940) Resp:  [11-29] 23 (02/25 0940) BP: (87-105)/(68-87) 94/76 (02/25 0900) SpO2:  [81 %-98 %] 93 % (02/25 0940) Weight:  [64.8 kg] 64.8 kg (02/25 0500) Last BM Date : 07/28/23  Weight change: Filed Weights   07/28/23 0500 07/29/23 0500 07/30/23 0500  Weight: 64.9 kg 68.2 kg 64.8 kg   Intake/Output:   Intake/Output Summary (Last 24 hours) at 07/30/2023 1008 Last data filed at 07/30/2023 0930 Gross per 24 hour  Intake 551.75 ml  Output 4575 ml  Net -4023.25 ml    Physical Exam   General:  elderly appearing.  No respiratory difficulty HEENT: normal Neck: supple. JVD ~7 cm.  Cor: PMI nondisplaced. Regular rate & rhythm. No rubs, gallops. 3/6 AS. Lungs: clear Abdomen: soft, nontender, nondistended. Good bowel sounds. Extremities: no cyanosis, clubbing, rash, edema  Neuro: alert & oriented x 3. Moves all 4 extremities w/o difficulty. Flat affect  Telemetry   ST 100-110s (Personally reviewed)    Labs   CBC Recent Labs    07/28/23 0503 07/30/23 0440 07/30/23 0443  WBC 5.4 4.4  --   HGB 12.8* 12.3* 12.9*  HCT 38.5* 37.0* 38.0*  MCV 94.1 93.0  --   PLT 139* 175  --    Basic Metabolic Panel Recent Labs     07/29/23 0500 07/30/23 0440 07/30/23 0443  NA 135 136 135  K 4.1 3.7 3.9  CL 97* 94*  --   CO2 29 32  --   GLUCOSE 113* 141*  --   BUN 28* 31*  --   CREATININE 1.16 1.06  --   CALCIUM 8.7* 8.8*  --   MG 2.1 2.2  --    Liver Function Tests Recent Labs    07/30/23 0440  AST 35  ALT 30  ALKPHOS 57  BILITOT 0.8  PROT 6.1*  ALBUMIN 3.0*    No results for input(s): "LIPASE", "AMYLASE" in the last 72 hours. Cardiac Enzymes No results for input(s): "CKTOTAL", "CKMB", "CKMBINDEX", "TROPONINI" in the last 72 hours.  BNP: BNP (last 3 results) Recent Labs    06/03/23 2152 07/18/23 1609  BNP 3,234.0* >4,500.0*   ProBNP (last 3 results) No results for input(s): "PROBNP" in the last 8760 hours.  D-Dimer No results for input(s): "DDIMER" in the last 72 hours. Hemoglobin A1C No results for input(s): "HGBA1C" in the last 72 hours.  Fasting Lipid Panel No results for input(s): "CHOL", "HDL", "LDLCALC", "TRIG", "CHOLHDL", "LDLDIRECT" in the last 72 hours. Thyroid Function Tests No results for input(s): "TSH", "T4TOTAL", "T3FREE", "THYROIDAB" in the last 72 hours.  Invalid input(s): "FREET3"  Other results:  Imaging   DG CHEST PORT 1  VIEW Result Date: 07/30/2023 CLINICAL DATA:  [id number] Encounter for preoperative anesthesiology assessment for cardiac surgery [id number] EXAM: PORTABLE CHEST 1 VIEW COMPARISON:  07/23/2023 FINDINGS: IABP marker is no longer evident. Right IJ approach pulmonary arterial catheter has been removed. Right PICC line terminates near the superior cavoatrial junction. Stable cardiomegaly. Aortic atherosclerosis. Improving aeration of both lungs with small residual right pleural effusion and mild right basilar opacity. No pneumothorax. IMPRESSION: Improving aeration of both lungs with small residual right pleural effusion and mild right basilar opacity. Electronically Signed   By: Duanne Guess D.O.   On: 07/30/2023 09:42      Medications:   Scheduled  Medications:  aspirin EC  81 mg Oral Daily   chlorhexidine  1 Application Topical Once   Chlorhexidine Gluconate Cloth  6 each Topical Daily   cyanocobalamin  1,000 mcg Oral Daily   digoxin  0.125 mg Oral Daily   enoxaparin (LOVENOX) injection  40 mg Subcutaneous Q24H   feeding supplement  237 mL Oral TID BM   folic acid  1 mg Oral Daily   magnesium sulfate  40 mEq Other To OR   multivitamin with minerals  1 tablet Oral Daily   potassium chloride  80 mEq Other To OR   rosuvastatin  20 mg Oral Daily   sodium chloride flush  10-40 mL Intracatheter Q12H   sodium chloride flush  3 mL Intravenous Q12H   thiamine  100 mg Oral Daily   traZODone  100 mg Oral QHS    Infusions:   ceFAZolin (ANCEF) IV     dexmedetomidine     DOBUTamine 2.5 mcg/kg/min (07/30/23 0800)   heparin 30,000 units/NS 1000 mL solution for CELLSAVER     norepinephrine (LEVOPHED) Adult infusion      PRN Medications: acetaminophen, magnesium hydroxide, ondansetron (ZOFRAN) IV, mouth rinse, oxyCODONE, sodium chloride flush, sodium chloride flush, temazepam, traMADol  Assessment/Plan   1. Acute on chronic systolic CHF/cardiogenic shock: Echo in 12/24 showed EF 20%, mild RV dysfunction, severe low flow/low gradient aortic stenosis with mean gradient 35 mmHg and AVA 0.49 cm^2. 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  He has severe aortic stenosis.  He also has anterior Qs on ECG so prior MI is certainly possible. He was re-admitted with marked volume overload and low cardiac output, co-ox 38%.  He was started on milrinone which was increased to 0.375 and diuresed with lasix drip. - 2/17 cath - IABP placed and continued on Milrinone. Due to low CO/CI he was switched to Dobutamine. 2/19 IABP removed. - Now on DBA in setting of severe LV dysfunction and severe AS - On DBA 2.5 Co-ox 65% Volume stable. Plan to reevaluate post TAVR.  - Continue DBA to support until TAVR - Continue spiro 12.5 mg daily with soft BP.  - Continue  digoxin 0.125 daily.  - No b-blocker with shock  2. Elevated troponin: Mild elevation with no trend. Suspect demand ischemia with volume overload.  - LHC 2/17 with nonobstructive CAD - Continue statin - Continue ASA 81 - No s/s angina  3. Aortic stenosis: Low flow/low gradient severe AS on 12/24 echo. This contributes to CHF.  He will ultimately need the aortic valve replaced.  - CT Surgery/Structural Team following. - TAVR CTs done 07/25/23. - Plan for high-risk TAVR later today.   4. Thrombocytopenia: Platelets have been mildly low since 12/24.  Follow.  - PLT count 116K -> 139K> 175K  5. Ascending aortic aneurysm: 5.0 cm on 12/24 CTA chest.  -  not surgical candidate  6. H/o ETOH abuse: Not very forthcoming on current drinking habits but apparently has quit for about 6 months.   7. Deconditioning - PT/OT following  8. Hyponatremia - Resolved  9. + UTI and RLL PNA on CT - PCT < 0.10 - Zosyn started 2/22. Last dose 2/24.   CRITICAL CARE Performed by: Alen Bleacher  Total critical care time: 12 minutes  Critical care time was exclusive of separately billable procedures and treating other patients.  Critical care was necessary to treat or prevent imminent or life-threatening deterioration.  Critical care was time spent personally by me on the following activities: development of treatment plan with patient and/or surrogate as well as nursing, discussions with consultants, evaluation of patient's response to treatment, examination of patient, obtaining history from patient or surrogate, ordering and performing treatments and interventions, ordering and review of laboratory studies, ordering and review of radiographic studies, pulse oximetry and re-evaluation of patient's condition.   Length of Stay: 12  Alen Bleacher, NP  07/30/2023, 10:08 AM  Advanced Heart Failure Team Pager [phone] (M-F; 7a - 5p)

## 2023-07-30 NOTE — Progress Notes (Signed)
 Physical Therapy Treatment Patient Details Name: [name] Daniel MRN: [id number] DOB: 03-11-1956 Today's Date: 07/30/2023   History of Present Illness 68 yo male admitted 2/13 after presenting to hernia repair followup visit with SOB, LB edema, CHF exacerbation. 2/17 RHC/LHC. IABP 2/17-2/19. [facility] 2/17-2/21. PMhx: TAA, HFrEF, severe AS, CM, T2DM, CKD, 12/24 hernia repair    PT Comments  Pt planned for TAVR at 3pm this afternoon. Pt agreeable to ambulate. Pt currently functioning at contact guard assist and requires RW for safe ambulation at this time. Pt mobility and d/c recommendations to be re-assess post TAVR as appropriate.    If plan is discharge home, recommend the following: A little help with walking and/or transfers;Assist for transportation;Help with stairs or ramp for entrance;Supervision due to cognitive status   Can travel by private vehicle     Yes  Equipment Recommendations  Rolling walker (2 wheels)    Recommendations for Other Services OT consult     Precautions / Restrictions Precautions Precautions: Fall Restrictions Weight Bearing Restrictions Per Provider Order: No     Mobility  Bed Mobility               General bed mobility comments: pt received sitting up in chair    Transfers Overall transfer level: Needs assistance Equipment used: Rolling walker (2 wheels), None Transfers: Sit to/from Stand Sit to Stand: Contact guard assist           General transfer comment: verbal cues for safe hand placement.    Ambulation/Gait Ambulation/Gait assistance: Contact guard assist Gait Distance (Feet): 180 Feet Assistive device: Rolling walker (2 wheels) Gait Pattern/deviations: Decreased weight shift to left, Antalgic, Knee hyperextension - left, Decreased step length - left, Step-through pattern Gait velocity: decreased Gait velocity interpretation: <1.31 ft/sec, indicative of household ambulator   General Gait Details: L foot remained  supinated throughout gait with forefoot initial contact. Pt has step through gait pattern on the L with partial step to gait pattern on the R. Pt reports L foot to have been broken from falling off a 2 story roof   Stairs             Wheelchair Mobility     Tilt Bed    Modified Rankin (Stroke Patients Only)       Balance Overall balance assessment: Needs assistance Sitting-balance support: No upper extremity supported, Feet supported Sitting balance-Leahy Scale: Good   Postural control: Posterior lean Standing balance support: Bilateral upper extremity supported, During functional activity, No upper extremity supported Standing balance-Leahy Scale: Fair Standing balance comment: reliant on RW, Min UE support. No overt LOB                            Communication Communication Communication: No apparent difficulties  Cognition Arousal: Alert Behavior During Therapy: WFL for tasks assessed/performed   PT - Cognitive impairments: No apparent impairments                       PT - Cognition Comments: flat affect Following commands: Intact      Cueing Cueing Techniques: Verbal cues, Tactile cues  Exercises      General Comments General comments (skin integrity, edema, etc.): VSS on RA      Pertinent Vitals/Pain Pain Assessment Pain Assessment: Faces Faces Pain Scale: Hurts little more Pain Location: reports chronic L foot pain from previous injury    Home Living  Prior Function            PT Goals (current goals can now be found in the care plan section) Acute Rehab PT Goals Patient Stated Goal: to get better PT Goal Formulation: With patient Time For Goal Achievement: 08/09/23 Potential to Achieve Goals: Good Progress towards PT goals: Progressing toward goals    Frequency    Min 1X/week      PT Plan      Co-evaluation              AM-PAC PT "6 Clicks" Mobility   Outcome  Measure  Help needed turning from your back to your side while in a flat bed without using bedrails?: A Little Help needed moving from lying on your back to sitting on the side of a flat bed without using bedrails?: A Little Help needed moving to and from a bed to a chair (including a wheelchair)?: A Little Help needed standing up from a chair using your arms (e.g., wheelchair or bedside chair)?: A Little Help needed to walk in hospital room?: A Little Help needed climbing 3-5 steps with a railing? : A Little 6 Click Score: 18    End of Session Equipment Utilized During Treatment: Gait belt Activity Tolerance: Patient tolerated treatment well Patient left: in chair;with call bell/phone within reach;with chair alarm set Nurse Communication: Mobility status PT Visit Diagnosis: Unsteadiness on feet (R26.81);Muscle weakness (generalized) (M62.81);Difficulty in walking, not elsewhere classified (R26.2)     Time: 1610-9604 PT Time Calculation (min) (ACUTE ONLY): 14 min  Charges:    $Gait Training: 8-22 mins PT General Charges $$ ACUTE PT VISIT: 1 Visit                     [name] Daniel, PT, DPT Acute Rehabilitation Services Secure chat preferred Office #: [phone]    [name] Daniel 07/30/2023, 12:36 PM

## 2023-07-30 NOTE — Op Note (Signed)
 [facility]   MULTIDISCIPLINARY HEART VALVE TEAM   TAVR OPERATIVE NOTE   Date of Procedure:  07/30/2023  Preoperative Diagnosis: Severe Aortic Stenosis   Postoperative Diagnosis: Same   Procedure:   Transcatheter Aortic Valve Replacement - Percutaneous Transfemoral Approach  Edwards Sapien 3 Ultra Resilia THV (size 29 mm, serial # [id number])   Co-Surgeons:  Eugenio Hoes, MD and Tonny Bollman, MD  Anesthesiologist:  Shona Simpson, MD  Echocardiographer:  Charlton Haws, MD  Pre-operative Echo Findings: Severe aortic stenosis Severe left ventricular systolic dysfunction  Post-operative Echo Findings: trace paravalvular leak unchanged left ventricular systolic function  BRIEF CLINICAL NOTE AND INDICATIONS FOR SURGERY  68 year old male who came in with cardiogenic shock and critical bicuspid aortic valve stenosis requiring care from the advanced heart failure team, initially treated with intra-aortic balloon pump and inotropic therapy, has remained on low-dose dobutamine.  Preoperative study showed that he has no obstructive CAD.  He is generating a 30 mmHg mean gradient across the aortic valve despite his very poor LV function.  After review of his case with the multidisciplinary heart team and discussion amongst the heart failure physicians, we elected to proceed with transfemoral TAVR with plans for backup hemodynamic support only if needed.  During the course of the patient's preoperative work up they have been evaluated comprehensively by a multidisciplinary team of specialists coordinated through the Multidisciplinary Heart Valve Clinic in the [facility].  They have been demonstrated to suffer from symptomatic severe aortic stenosis as noted above. The patient has been counseled extensively as to the relative risks and benefits of all options for the treatment of severe aortic stenosis including long term medical therapy, conventional  surgery for aortic valve replacement, and transcatheter aortic valve replacement.  The patient has been independently evaluated in formal cardiac surgical consultation by Dr Leafy Ro, who deemed the patient appropriate for TAVR. Based upon review of all of the patient's preoperative diagnostic tests they are felt to be candidate for transcatheter aortic valve replacement using the transfemoral approach as an alternative to conventional surgery.    Following the decision to proceed with transcatheter aortic valve replacement, a discussion has been held regarding what types of management strategies would be attempted intraoperatively in the event of life-threatening complications, including whether or not the patient would be considered a candidate for the use of cardiopulmonary bypass and/or conversion to open sternotomy for attempted surgical intervention.  The patient has been advised of a variety of complications that might develop peculiar to this approach including but not limited to risks of death, stroke, paravalvular leak, aortic dissection or other major vascular complications, aortic annulus rupture, device embolization, cardiac rupture or perforation, acute myocardial infarction, arrhythmia, heart block or bradycardia requiring permanent pacemaker placement, congestive heart failure, respiratory failure, renal failure, pneumonia, infection, other late complications related to structural valve deterioration or migration, or other complications that might ultimately cause a temporary or permanent loss of functional independence or other long term morbidity.  The patient provides full informed consent for the procedure as described and all questions were answered preoperatively.  DETAILS OF THE OPERATIVE PROCEDURE  PREPARATION:   The patient is brought to the operating room on the above mentioned date and central monitoring was established by the anesthesia team including placement of a radial arterial  line. The patient is placed in the supine position on the operating table.  Intravenous antibiotics are administered. The patient is monitored closely throughout the procedure under conscious  sedation.  Baseline transthoracic echocardiogram is performed. The patient's chest, abdomen, both groins, and both lower extremities are prepared and draped in a sterile manner. A time out procedure is performed.   PERIPHERAL ACCESS:   Using ultrasound guidance, femoral arterial and venous access is obtained with placement of 6 Fr sheaths on the right side.  [place] images are digitally captured and stored in the patient's chart. A pigtail diagnostic catheter was passed through the femoral arterial sheath under fluoroscopic guidance into the aortic root.  A temporary transvenous pacemaker catheter was passed through the femoral venous sheath under fluoroscopic guidance into the right ventricle.  The pacemaker was tested to ensure stable lead placement and pacemaker capture. Aortic root angiography was performed in order to determine the optimal angiographic angle for valve deployment.  TRANSFEMORAL ACCESS:  A micropuncture technique is used to access the left femoral artery under fluoroscopic and ultrasound guidance.  Venous access was also obtained in the left femoral vein in case the patient needed percutaneous hemodynamic support.  2 Perclose devices are deployed at 10' and 2' positions to 'PreClose' the femoral artery. An 8 French sheath is placed and then an Amplatz Superstiff wire is advanced through the sheath. This is changed out for a 16 French transfemoral E-Sheath after progressively dilating over the Superstiff wire.  A multipurpose catheter was used to direct a straight-tip exchange length wire across the native aortic valve into the left ventricle.  The valve was difficult to cross.  I initially tried to cross the valve with a straight wire directed by an AL 2 and an AL 3 catheter, but this was unsuccessful.   The multipurpose catheter was exchanged out for a pigtail catheter and position was confirmed in the LV apex. Simultaneous LV and Ao pressures were recorded.  The pigtail catheter was exchanged for a Safari wire in the LV apex.    BALLOON AORTIC VALVULOPLASTY:  Performed with a 20 mm Bard true balloon using rapid ventricular pacing.  The patient's hemodynamic recovery is good.  TRANSCATHETER HEART VALVE DEPLOYMENT:  An Edwards Sapien 3 Ultra Resilia transcatheter heart valve (size 29 mm with an additional 4 cc) was prepared and crimped per manufacturer's guidelines, and the proper orientation of the valve is confirmed on the [employer] delivery system. The valve was advanced through the introducer sheath using normal technique until in an appropriate position in the abdominal aorta beyond the sheath tip. The balloon was then retracted and using the fine-tuning wheel was centered on the valve. The valve was then advanced across the aortic arch using appropriate flexion of the catheter. The valve was carefully positioned across the aortic valve annulus. The Commander catheter was retracted using normal technique. Once final position of the valve has been confirmed by angiographic assessment, the valve is deployed while temporarily holding ventilation and during rapid ventricular pacing to maintain systolic blood pressure < 50 mmHg and pulse pressure < 10 mmHg. The balloon inflation is held for >3 seconds after reaching full deployment volume. Once the balloon has fully deflated the balloon is retracted into the ascending aorta and valve function is assessed using echocardiography. The patient's hemodynamic recovery following valve deployment is good.  The deployment balloon and guidewire are both removed. Echo demostrated acceptable post-procedural gradients, stable mitral valve function, and trace aortic insufficiency.    PROCEDURE COMPLETION:  The sheath was removed and femoral artery closure is  performed using the 2 previously deployed Perclose devices.  Protamine is administered once femoral arterial repair  was complete. The site is clear with no evidence of bleeding or hematoma after the sutures are tightened. The temporary pacemaker and pigtail catheters are removed. Mynx closure is used for contralateral femoral arterial hemostasis for the 6 Fr sheath.  The patient tolerated the procedure well and is transported to the recovery area in stable condition. There were no immediate intraoperative complications. All sponge instrument and needle counts are verified correct at completion of the operation.   The patient received a total of 40 mL of intravenous contrast during the procedure.  EBL: minimal  LVEDP: 32 mmHg   Tonny Bollman, MD 07/30/2023 6:11 PM

## 2023-07-30 NOTE — Interval H&P Note (Signed)
 History and Physical Interval Note:  07/30/2023 1:23 PM  [name] Daniel  has presented today for surgery, with the diagnosis of Severe Aortic Stenosis.  The various methods of treatment have been discussed with the patient and family. After consideration of risks, benefits and other options for treatment, the patient has consented to  Procedure(s): Transcatheter Aortic Valve Replacement, Transfemoral (N/A) INTRAOPERATIVE TRANSTHORACIC ECHOCARDIOGRAM (N/A) as a surgical intervention.  The patient's history has been reviewed, patient examined, no change in status, stable for surgery.  I have reviewed the patient's chart and labs.  Questions were answered to the patient's satisfaction.     Eugenio Hoes

## 2023-07-30 NOTE — Progress Notes (Signed)
 Nutrition Follow-up  DOCUMENTATION CODES:   Severe malnutrition in context of chronic illness  INTERVENTION:   Allow double portions of protein/entree at meals  Continue Ensure Enlive po TID, each supplement provides 350 kcal and 20 grams of protein.  Continue MVI, Thiamine, B12, Folic Acid   NUTRITION DIAGNOSIS:   Severe Malnutrition related to chronic illness as evidenced by severe fat depletion, severe muscle depletion.  Being addressed via supplements  GOAL:   Patient will meet greater than or equal to 90% of their needs  Progressing  MONITOR:   PO intake, Supplement acceptance, Labs, Weight trends, Skin  REASON FOR ASSESSMENT:   Consult, Rounds Assessment of nutrition requirement/status  ASSESSMENT:   68 yo male admitted with acute on chronic heart failure with cardiogenic shock and severe aortic stenosis; required IABP with plans for TAVR eval. PMH includes CHF, CKD, hx of folate and B12 deficiency, hx of EtOH  NPO for TAVR today  Per RN, pt has been eating very well, 75-100% of meals in addition to drinking Ensure TID. Pt with very good appetite.   Current wt 64.8 kg, admit wt 78.3 kg. Last wt 62.3 kg  Labs: sodium wdl, BUN 31, Creatinine wdl Meds: B12, folic acid, thiamine, MVI, mag sulfate, KCl   Diet Order:   Diet Order             Diet NPO time specified  Diet effective midnight                   EDUCATION NEEDS:   Education needs have been addressed  Skin:  Skin Assessment: Reviewed RN Assessment  Last BM:  2/23  Height:   Ht Readings from Last 1 Encounters:  07/27/23 6\' 1"  (1.854 m)    Weight:   Wt Readings from Last 1 Encounters:  07/30/23 64.8 kg   BMI:  Body mass index is 18.85 kg/m.  Estimated Nutritional Needs:   Kcal:  2100-2300 kcals  Protein:  105-125 g  Fluid:  1.8 L   Romelle Starcher MS, RDN, LDN, CNSC Registered Dietitian 3 Clinical Nutrition RD Inpatient Contact Info in Amion

## 2023-07-30 NOTE — Anesthesia Procedure Notes (Signed)
 Arterial Line Insertion Start/End2/25/2025 2:10 PM, 07/30/2023 2:15 PM Performed by: Darryl Nestle, CRNA, CRNA  Patient location: Pre-op. Preanesthetic checklist: patient identified, IV checked, site marked, risks and benefits discussed, surgical consent, monitors and equipment checked, pre-op evaluation, timeout performed and anesthesia consent Lidocaine 1% used for infiltration Right, radial was placed Catheter size: 20 G Hand hygiene performed  and maximum sterile barriers used   Attempts: 1 Procedure performed without using ultrasound guided technique. Following insertion, dressing applied and Biopatch. Post procedure assessment: normal and unchanged  Patient tolerated the procedure well with no immediate complications.

## 2023-07-31 ENCOUNTER — Other Ambulatory Visit: Payer: Self-pay | Admitting: Physician Assistant

## 2023-07-31 ENCOUNTER — Inpatient Hospital Stay (HOSPITAL_COMMUNITY): Payer: Medicare Other

## 2023-07-31 ENCOUNTER — Encounter (HOSPITAL_COMMUNITY): Payer: Self-pay | Admitting: Cardiovascular Disease

## 2023-07-31 DIAGNOSIS — Z952 Presence of prosthetic heart valve: Secondary | ICD-10-CM

## 2023-07-31 LAB — BASIC METABOLIC PANEL
Anion gap: 10 (ref 5–15)
BUN: 27 mg/dL — ABNORMAL HIGH (ref 8–23)
CO2: 30 mmol/L (ref 22–32)
Calcium: 8.6 mg/dL — ABNORMAL LOW (ref 8.9–10.3)
Chloride: 95 mmol/L — ABNORMAL LOW (ref 98–111)
Creatinine, Ser: 1 mg/dL (ref 0.61–1.24)
GFR, Estimated: >60 mL/min (ref >60)
Glucose, Bld: 149 mg/dL — ABNORMAL HIGH (ref 70–99)
Potassium: 3.7 mmol/L (ref 3.5–5.1)
Sodium: 135 mmol/L (ref 135–145)

## 2023-07-31 LAB — MAGNESIUM: Magnesium: 2.2 mg/dL (ref 1.7–2.4)

## 2023-07-31 LAB — COOXEMETRY PANEL
Carboxyhemoglobin: 2.2 % — ABNORMAL HIGH (ref 0.5–1.5)
Carboxyhemoglobin: 2.6 % — ABNORMAL HIGH (ref 0.5–1.5)
Methemoglobin: 1.1 % (ref 0.0–1.5)
Methemoglobin: 0.7 % (ref 0.0–1.5)
O2 Saturation: 75.2 %
O2 Saturation: 81 %
Total hemoglobin: 11.7 g/dL — ABNORMAL LOW (ref 12.0–16.0)
Total hemoglobin: 12 g/dL (ref 12.0–16.0)

## 2023-07-31 LAB — ECHOCARDIOGRAM COMPLETE
AR max vel: 2.8 cm2
AV Area VTI: 2.29 cm2
AV Area mean vel: 2.45 cm2
AV Mean grad: 8 mm[Hg]
AV Peak grad: 15.1 mm[Hg]
Ao pk vel: 1.94 m/s
Area-P 1/2: 5.23 cm2
Calc EF: 38.4 %
Height: 73 in
MV VTI: 2.63 cm2
S' Lateral: 4.5 cm
Single Plane A2C EF: 34.6 %
Single Plane A4C EF: 37.8 %
Weight: 2303.37 [oz_av]

## 2023-07-31 LAB — HEMOGLOBIN A1C
Hgb A1c MFr Bld: 7 % — ABNORMAL HIGH (ref 4.8–5.6)
Mean Plasma Glucose: 154 mg/dL

## 2023-07-31 MED ORDER — POTASSIUM CHLORIDE CRYS ER 20 MEQ PO TBCR
40.0000 meq | EXTENDED_RELEASE_TABLET | Freq: Once | ORAL | Status: AC
  Administered 2023-07-31: 40 meq via ORAL
  Filled 2023-07-31: qty 2

## 2023-07-31 MED ORDER — SODIUM CHLORIDE 0.9% FLUSH: 3.0000 mL | INTRAVENOUS | Status: DC | PRN

## 2023-07-31 MED ORDER — SPIRONOLACTONE 12.5 MG HALF TABLET
12.5000 mg | ORAL_TABLET | Freq: Every day | ORAL | Status: DC
  Administered 2023-07-31 – 2023-08-01 (×2): 12.5 mg via ORAL
  Filled 2023-07-31 (×2): qty 1

## 2023-07-31 MED ORDER — NITROGLYCERIN IN D5W 200-5 MCG/ML-% IV SOLN: 0.0000 ug/min | INTRAVENOUS | Status: DC

## 2023-07-31 MED ORDER — DAPAGLIFLOZIN PROPANEDIOL 10 MG PO TABS
10.0000 mg | ORAL_TABLET | Freq: Every day | ORAL | Status: DC
  Administered 2023-07-31: 10 mg via ORAL
  Filled 2023-07-31 (×2): qty 1

## 2023-07-31 MED ORDER — MORPHINE SULFATE (PF) 2 MG/ML IV SOLN
1.0000 mg | INTRAVENOUS | Status: DC | PRN
Start: 2023-07-31 — End: 2023-08-01

## 2023-07-31 MED ORDER — SODIUM CHLORIDE 0.9% FLUSH
3.0000 mL | Freq: Two times a day (BID) | INTRAVENOUS | Status: DC
  Administered 2023-07-31: 3 mL via INTRAVENOUS

## 2023-07-31 MED ORDER — SODIUM CHLORIDE 0.9 % IV SOLN: INTRAVENOUS | Status: AC

## 2023-07-31 MED ORDER — SODIUM CHLORIDE 0.9 % IV SOLN: 250.0000 mL | INTRAVENOUS | Status: DC | PRN

## 2023-07-31 MED ORDER — CEFAZOLIN SODIUM-DEXTROSE 2-4 GM/100ML-% IV SOLN
2.0000 g | Freq: Three times a day (TID) | INTRAVENOUS | Status: AC
  Administered 2023-07-31 (×2): 2 g via INTRAVENOUS
  Filled 2023-07-31 (×2): qty 100

## 2023-07-31 MED ORDER — POTASSIUM CHLORIDE CRYS ER 20 MEQ PO TBCR: 40.0000 meq | EXTENDED_RELEASE_TABLET | Freq: Once | ORAL | Status: DC

## 2023-07-31 NOTE — Evaluation (Signed)
 Physical Therapy Re-Evaluation Patient Details Name: [name] Daniel MRN: [id number] DOB: 03-23-1956 Today's Date: 07/31/2023  History of Present Illness  68 yo male admitted 2/13 after presenting to hernia repair followup visit with SOB, LB edema, CHF exacerbation. 2/17 RHC/LHC. IABP 2/17-2/19. [facility] 2/17-2/21. Pt now s/p TAVR and transfemoral  intraoperative transthorcic echocardiogram performed 2/25. PM hx: TAA, HFrEF, severe AS, CM, T2DM, CKD, 12/24 hernia repair  Clinical Impression  Pt admitted with above diagnosis. Pt was able to ambulate with RW with supervision to CGA.  Pt wants to go home and pt is safe with use of RW even with challenges. No LOB noted.  Pt without change since initial evaluation and revised goals today due to surgery.  Will continue to follow acutely and progress pt as able.  Pt currently with functional limitations due to the deficits listed below (see PT Problem List). Pt will benefit from acute skilled PT to increase their independence and safety with mobility to allow discharge.           If plan is discharge home, recommend the following: A little help with walking and/or transfers;Assist for transportation;Help with stairs or ramp for entrance;Supervision due to cognitive status   Can travel by private vehicle   Yes    Equipment Recommendations Rolling walker (2 wheels);BSC/3in1  Recommendations for Other Services       Functional Status Assessment Patient has had a recent decline in their functional status and demonstrates the ability to make significant improvements in function in a reasonable and predictable amount of time.     Precautions / Restrictions Precautions Precautions: Fall Restrictions Weight Bearing Restrictions Per Provider Order: No      Mobility  Bed Mobility               General bed mobility comments: pt in chair    Transfers Overall transfer level: Needs assistance Equipment used: Rolling walker (2 wheels),  None Transfers: Sit to/from Stand, Bed to chair/wheelchair/BSC Sit to Stand: Supervision   Step pivot transfers: Supervision       General transfer comment: No assist or cues needed.    Ambulation/Gait Ambulation/Gait assistance: Supervision Gait Distance (Feet): 225 Feet Assistive device: Rolling walker (2 wheels) Gait Pattern/deviations: Decreased weight shift to left, Antalgic, Knee hyperextension - left, Decreased step length - left, Step-through pattern Gait velocity: decreased Gait velocity interpretation: <1.31 ft/sec, indicative of household ambulator   General Gait Details: L foot remained supinated throughout gait with forefoot initial contact. Pt has step through gait pattern on the L with partial step to gait pattern on the R. Pt reports L foot to have been broken from falling off a 2 story roof. No LOB today and continues to incr distance with walking.  Pt agreeable to use RW at home for safety and demonstrates good safety with challenges with RW.  Stairs            Wheelchair Mobility     Tilt Bed    Modified Rankin (Stroke Patients Only)       Balance Overall balance assessment: Needs assistance Sitting-balance support: No upper extremity supported, Feet supported Sitting balance-Leahy Scale: Good     Standing balance support: Bilateral upper extremity supported, Single extremity supported, During functional activity, Reliant on assistive device for balance Standing balance-Leahy Scale: Poor Standing balance comment: Reliant on RW. No overt LOB  Pertinent Vitals/Pain Pain Assessment Pain Assessment: Faces Faces Pain Scale: Hurts little more Pain Location: B LE with pt reporting this is baseline Pain Descriptors / Indicators: Cramping, Tingling Pain Intervention(s): Limited activity within patient's tolerance, Monitored during session, Repositioned    Home Living Family/patient expects to be discharged to::  Private residence Living Arrangements: Alone Available Help at Discharge: Family;Available PRN/intermittently Type of Home: Apartment Home Access: Level entry       Home Layout: One level Home Equipment: Agricultural consultant (2 wheels)      Prior Function Prior Level of Function : Independent/Modified Independent             Mobility Comments: no AD ADLs Comments: reports Indep with ADLs, IADLs in the home; reports he has no hobbies but enjoys watching NASCAR on TV. Sister assists with transportation as needed     Extremity/Trunk Assessment   Upper Extremity Assessment Upper Extremity Assessment: Defer to OT evaluation    Lower Extremity Assessment LLE Deficits / Details: bow-legged upon standing, walks with foot inverted w/ knee hyperextension thrust    Cervical / Trunk Assessment Cervical / Trunk Assessment: Normal  Communication   Communication Communication: No apparent difficulties    Cognition Arousal: Alert Behavior During Therapy: WFL for tasks assessed/performed   PT - Cognitive impairments: No apparent impairments                       PT - Cognition Comments: flat affect Following commands: Intact       Cueing Cueing Techniques: Verbal cues     General Comments General comments (skin integrity, edema, etc.): VSS on RA    Exercises General Exercises - Lower Extremity Ankle Circles/Pumps: AROM, Both, 10 reps, Seated Long Arc Quad: AROM, Both, 10 reps, Seated   Assessment/Plan    PT Assessment Patient needs continued PT services  PT Problem List Decreased strength;Decreased range of motion;Decreased activity tolerance;Decreased balance;Decreased mobility;Decreased knowledge of use of DME;Decreased safety awareness       PT Treatment Interventions DME instruction;Gait training;Therapeutic activities;Stair training;Functional mobility training;Therapeutic exercise;Balance training;Cognitive remediation;Neuromuscular  re-education;Patient/family education    PT Goals (Current goals can be found in the Care Plan section)  Acute Rehab PT Goals Patient Stated Goal: to get better PT Goal Formulation: With patient Time For Goal Achievement: 08/14/23 Potential to Achieve Goals: Good    Frequency Min 1X/week     Co-evaluation               AM-PAC PT "6 Clicks" Mobility  Outcome Measure Help needed turning from your back to your side while in a flat bed without using bedrails?: A Little Help needed moving from lying on your back to sitting on the side of a flat bed without using bedrails?: A Little Help needed moving to and from a bed to a chair (including a wheelchair)?: A Little Help needed standing up from a chair using your arms (e.g., wheelchair or bedside chair)?: A Little Help needed to walk in hospital room?: A Little Help needed climbing 3-5 steps with a railing? : A Little 6 Click Score: 18    End of Session Equipment Utilized During Treatment: Gait belt Activity Tolerance: Patient tolerated treatment well Patient left: in chair;with call bell/phone within reach;with chair alarm set Nurse Communication: Mobility status PT Visit Diagnosis: Unsteadiness on feet (R26.81);Muscle weakness (generalized) (M62.81);Difficulty in walking, not elsewhere classified (R26.2)    Time: 7846-9629 PT Time Calculation (min) (ACUTE ONLY): 18 min   Charges:  PT Evaluation $PT Re-evaluation: 1 Re-eval   PT General Charges $$ ACUTE PT VISIT: 1 Visit         Johniya Durfee M,PT Acute Rehab Services [phone]   Bevelyn Buckles 07/31/2023, 5:02 PM

## 2023-07-31 NOTE — Progress Notes (Addendum)
 [facility]   MULTIDISCIPLINARY HEART VALVE TEAM  Patient Name: [name] Daniel Date of Encounter: 07/31/2023  Admit date: 07/18/2023  Primary Care Provider: Patient, No Pcp Per [facility] Cardiologist: Dina Rich, MD  [facility] Electrophysiologist:  None   Hospital Problem List     Principal Problem:   Acute on chronic systolic CHF (congestive heart failure) (HCC) Active Problems:   Thrombocytopenia (HCC)   Aortic stenosis, severe   Stage 3a chronic kidney disease (CKD) (HCC)   Protein-calorie malnutrition, severe   T2DM (type 2 diabetes mellitus) (HCC)   History of alcohol abuse   S/P TAVR (transcatheter aortic valve replacement)     Subjective   Breathing and energy have improved. Otherwise, no complaints.   Inpatient Medications    Scheduled Meds:  aspirin EC  81 mg Oral Daily   Chlorhexidine Gluconate Cloth  6 each Topical Daily   cyanocobalamin  1,000 mcg Oral Daily   digoxin  0.125 mg Oral Daily   enoxaparin (LOVENOX) injection  40 mg Subcutaneous Q24H   feeding supplement  237 mL Oral TID BM   folic acid  1 mg Oral Daily   multivitamin with minerals  1 tablet Oral Daily   rosuvastatin  20 mg Oral Daily   sodium chloride flush  10-40 mL Intracatheter Q12H   sodium chloride flush  3 mL Intravenous Q12H   sodium chloride flush  3 mL Intravenous Q12H   thiamine  100 mg Oral Daily   traZODone  100 mg Oral QHS   Continuous Infusions:  sodium chloride 50 mL/hr at 07/31/23 1052   sodium chloride      ceFAZolin (ANCEF) IV Stopped (07/31/23 0730)   nitroGLYCERIN     PRN Meds: sodium chloride, acetaminophen, magnesium hydroxide, morphine injection, ondansetron (ZOFRAN) IV, mouth rinse, oxyCODONE, sodium chloride flush, sodium chloride flush, sodium chloride flush, traMADol   Vital Signs    Vitals:   07/31/23 0700 07/31/23 0800 07/31/23 0900 07/31/23 1000  BP: 121/77 100/77 114/73 91/62  Pulse: 92 85 92 98  Resp: (!) 23 (!) 22  (!) 21 (!) 23  Temp: 98.9 F (37.2 C) 98.4 F (36.9 C)    TempSrc: Oral Oral    SpO2: 94% 93% 94% 93%  Weight:      Height:        Intake/Output Summary (Last 24 hours) at 07/31/2023 1118 Last data filed at 07/31/2023 0800 Gross per 24 hour  Intake 1482.63 ml  Output 1050 ml  Net 432.63 ml   Filed Weights   07/29/23 0500 07/30/23 0500 07/31/23 0500  Weight: 68.2 kg 64.8 kg 65.3 kg    Physical Exam    GEN: thin white male HEENT: Grossly normal.  Neck: Supple, no JVD, or masses. Cardiac: RRR, no murmurs, rubs, or gallops. No clubbing, cyanosis, edema.   Respiratory:  Respirations regular and unlabored, clear to auscultation bilaterally. GI: Soft, nontender, nondistended, BS + x 4. MS: no deformity or atrophy. Skin: warm and dry, no rash. Neuro:  Strength and sensation are intact. Psych: AAOx3.  Normal affect.  Labs    CBC Recent Labs    07/30/23 0440 07/30/23 0443  WBC 4.4  --   HGB 12.3* 12.9*  HCT 37.0* 38.0*  MCV 93.0  --   PLT 175  --    Basic Metabolic Panel Recent Labs    16/10/96 0500 07/30/23 0440 07/30/23 0443 07/31/23 0506  NA 135 136 135  --   K 4.1 3.7  3.9  --   CL 97* 94*  --   --   CO2 29 32  --   --   GLUCOSE 113* 141*  --   --   BUN 28* 31*  --   --   CREATININE 1.16 1.06  --   --   CALCIUM 8.7* 8.8*  --   --   MG 2.1 2.2  --  2.2   Liver Function Tests Recent Labs    07/30/23 0440  AST 35  ALT 30  ALKPHOS 57  BILITOT 0.8  PROT 6.1*  ALBUMIN 3.0*   No results for input(s): "LIPASE", "AMYLASE" in the last 72 hours. Cardiac Enzymes No results for input(s): "CKTOTAL", "CKMB", "CKMBINDEX", "TROPONINI" in the last 72 hours. BNP Invalid input(s): "POCBNP" D-Dimer No results for input(s): "DDIMER" in the last 72 hours. Hemoglobin A1C Recent Labs    07/30/23 0440  HGBA1C 7.0*   Fasting Lipid Panel No results for input(s): "CHOL", "HDL", "LDLCALC", "TRIG", "CHOLHDL", "LDLDIRECT" in the last 72 hours. Thyroid Function  Tests No results for input(s): "TSH", "T4TOTAL", "T3FREE", "THYROIDAB" in the last 72 hours.  Invalid input(s): "FREET3"  Telemetry    Sinus with HRs in 90s, several shorts runs of NSVT.  - Personally Reviewed  ECG    Sinus rhythm with 1st degree A-V block, Inferior Q waves, ST & T wave abnormality in lateral leads. HR 94 - Personally Reviewed  Cardiac Studies   TAVR OPERATIVE NOTE     Date of Procedure:                07/30/2023   Preoperative Diagnosis:      Severe Aortic Stenosis    Postoperative Diagnosis:    Same    Procedure:        Transcatheter Aortic Valve Replacement - Percutaneous Transfemoral Approach             Edwards Sapien 3 Ultra Resilia THV (size 29 mm, serial # [id number])              Co-Surgeons:                        Eugenio Hoes, MD and Tonny Bollman, MD   Anesthesiologist:                  Shona Simpson, MD   Echocardiographer:              Charlton Haws, MD   Pre-operative Echo Findings: Severe aortic stenosis Severe left ventricular systolic dysfunction   Post-operative Echo Findings: trace paravalvular leak unchanged left ventricular systolic function  _________________________  2D Echo 07/31/23 IMPRESSIONS   1. Left ventricular ejection fraction, by estimation, is 30 to 35%. Left  ventricular ejection fraction by 3D volume is 35 %. The left ventricle has  moderately decreased function. The left ventricle demonstrates global  hypokinesis. There is moderate  concentric left ventricular hypertrophy. Left ventricular diastolic  parameters are consistent with Grade I diastolic dysfunction (impaired  relaxation). The average left ventricular global longitudinal strain is  -7.0 %. The global longitudinal strain is  abnormal.   2. Right ventricular systolic function is normal. The right ventricular  size is mildly enlarged. Tricuspid regurgitation signal is inadequate for  assessing PA pressure.   3. Left atrial size was mildly dilated.    4. Right atrial size was mildly dilated.   5. The mitral valve is normal in structure. Trivial mitral valve  regurgitation.  No evidence of mitral stenosis.   6. Bioprosthetic aortic valve s/p TAVR wtih 29 mm Edwards Sapien THV.  Mean gradient 8 mmHg. EOA 2.29 cm^2. No perivalvular leakage.   7. Aortic dilatation noted. There is borderline dilatation of the aortic  root, measuring 38 mm.   8. The inferior vena cava is normal in size with greater than 50%  respiratory variability, suggesting right atrial pressure of 3 mmHg.   9. A small pericardial effusion is present. The pericardial effusion is  posterior to the left ventricle.   Patient Profile     MINA CARLISI is a 68 y.o. male with a history of HTN, HFrEF, TAA (5.1cm), CKD stage IIIa, DMT2, thrombocytopenia, hyponatremia, alcohol abuse, low BMI (18) and severe LFLG AS admitted 07/21/23 with biventricular failure, cardiogenic shock and severe LFLG AS. Structural heart team consulted for consideration of TAVR.   Assessment & Plan    Severe LFLG AS:  -- S/p successful TAVR with a 29 mm Edwards Sapien 3 Ultra Resilia THV ( with an extra 4CCs of additional volume) via the TF approach on 07/30/23.  -- Post operative echo shows EF 30-35%, mild RVE, normally functioning TAVR with a mean gradient of 8 mmHg and no PVL as well as a small pericardiac effusion.  -- Groin sites are stable.  -- ECG with sinus with old 1st deg AV block but no HAVB. -- Continue Asprin 81mg  daily.  -- Cardiac rehab to discuss CRP phase II.  -- Structural heart to sign off.  -- Will arrange 1 month follow up visit with echo in the outpatient setting.   Acute on chronic systolic CHF/cardiogenic shock: -- IABP removed 07/24/23.  -- Dobutamine weaned down to 2.36mcg. BP much improved s/p TAVR. -- Currently on digoxin 0.125 daily.  -- LVEDP 32 mm hg at the time of TAVR. -- Being managed by the advanced CHF team.   NSVT:  -- Several runs noted on tele.  -- Would start  Toprol XL when cleared by heart failure team.    Ascending aortic aneurysm: -- 5.1cm on CTA.  -- Not a surgical candidate.    Thrombocytopenia:  -- CBC pending   Hyponatremia:  -- BMET pending.    Weakness:  -- Being followed by PT/OT -- May need ST SNF placement.   Pulmonary nodule:  -- Cardiac CT scan reported an incidental 12 x 6 mm medial left lower lobe subsolid nodule that will require follow up in 6 months.  -- This will be discussed in the outpatient setting.    PNA/UTI: -- Treated with IV Zosyn.    Byrd Hesselbach, PA-C  07/31/2023, 11:18 AM  Pager [phone]  Patient seen, examined. Available data reviewed. Agree with findings, assessment, and plan as outlined by Carlean Jews, PA-C.  The patient is independently interviewed and examined.  He is alert, oriented, in no distress.  Bilateral groin sites are clear with no hematoma or ecchymosis.  JVP is normal, lungs are clear bilaterally, heart is regular rate and rhythm no murmur gallop, abdomen is soft and nontender, skin is warm and dry with no rash.  The patient's echocardiogram shows improvement in LV function now with LVEF of 30 to 35%.  This is improved from previous LVEF of 20%.  His TAVR prosthetic function appears normal with a mean transvalvular gradient of 8 mmHg and no paravalvular regurgitation.  The patient is now off of dobutamine.  Would continue lifelong aspirin for antiplatelet therapy.  Will arrange outpatient structural heart  follow-up.  Hopefully he will now progress fairly rapidly and be ready for hospital discharge in the next 24 to 48 hours.  Otherwise, as outlined above.  Tonny Bollman, M.D. 07/31/2023 1:06 PM

## 2023-07-31 NOTE — TOC Progression Note (Signed)
 Transition of Care [facility]) - Progression Note    Patient Details  Name: [name] Daniel MRN: [id number] Date of Birth: [birth year]  [facility] CM/SW Contact  Nicanor Bake Phone Number: [phone] 07/31/2023, 12:37 PM  Clinical Narrative:   HF CSW met with pt at bedside. CSW inquired with pt about SNFs. Pt stated that he he wants to go home and not to a SNF. CSW stated that she will inquire with PT. Pt stated that PT informed him that he can go home and do therapy.   TOC will continue following.     Expected Discharge Plan: Home w Home Health Services Barriers to Discharge: Continued Medical Work up  Expected Discharge Plan and Services In-house Referral: Clinical Social Work     Living arrangements for the past 2 months: Single Family Home                                       Social Determinants of Health (SDOH) Interventions SDOH Screenings   Food Insecurity: No Food Insecurity (07/18/2023)  Housing: Low Risk  (07/18/2023)  Transportation Needs: No Transportation Needs (07/18/2023)  Utilities: Not At Risk (07/18/2023)  Social Connections: Socially Isolated (07/18/2023)  Tobacco Use: Medium Risk (07/30/2023)    Readmission Risk Interventions    05/09/2023   12:49 PM  Readmission Risk Prevention Plan  Post Dischage Appt Not Complete  Medication Screening Complete  Transportation Screening Complete

## 2023-07-31 NOTE — Evaluation (Addendum)
 Occupational Therapy Re-Evaluation Patient Details Name: [name] Daniel MRN: [id number] DOB: 11/28/1955 Today's Date: 07/31/2023   History of Present Illness   68 yo male admitted 2/13 after presenting to hernia repair followup visit with SOB, LB edema, CHF exacerbation. 2/17 RHC/LHC. IABP 2/17-2/19. [facility] 2/17-2/21. Pt now s/p TAVR and transfemoral  intraoperative transthorcic echocardiogram performed 2/25. PM hx: TAA, HFrEF, severe AS, CM, T2DM, CKD, 12/24 hernia repair     Clinical Impressions OT reval completed as pt is now s/p TAVR and transfemoral  intraoperative transthorcic echocardiogram. Pt presents with no significant change in functional level from OT eval completed 2/24. Acute OT goals remain appropriate. Pt currently demonstrating ability to complete ADLs Independent to Min assist and functional transfers/mobility with a RW with Contact guard to Min assist. Pt VSS on RA throughout session. Pt participated well in session and is making progress toward goals. Pt continues to be limited by decreased activity tolerance, decreased balance, mildly decreased cognition, and decreased safety and independence with functional tasks. Pt will benefit from continued acute skilled OT services to address deficits below. Post-acute discharge, OT recommends intensive inpatient skilled rehab services < 3 hours per day based on current functional level and pt report of only limited PRN assist available in the home. However, discharge recommendation may be able to be upgraded to [facility] OT based on pt progress in acute rehab services. OT to update recommendation as appropriate.   Addendum: OT discharge recommendation updated at this time to [facility] OT with family assist following conversation with PT as pt presents this afternoon with improved activity tolerance and safety awareness during PT session compared to this morning's OT session, and pt now stating a strong preference for [facility] OT/PT upon discharge.       If plan is discharge home, recommend the following:   A little help with walking and/or transfers;A little help with bathing/dressing/bathroom;Assistance with cooking/housework;Assist for transportation;Help with stairs or ramp for entrance     Functional Status Assessment   Patient has had a recent decline in their functional status and demonstrates the ability to make significant improvements in function in a reasonable and predictable amount of time.     Equipment Recommendations   BSC/3in1     Recommendations for Other Services         Precautions/Restrictions   Precautions Precautions: Fall     Mobility Bed Mobility Overal bed mobility: Needs Assistance Bed Mobility: Supine to Sit     Supine to sit: Supervision     General bed mobility comments: with increased time and cues for hand placement/technique    Transfers Overall transfer level: Needs assistance Equipment used: Rolling walker (2 wheels), None Transfers: Sit to/from Stand, Bed to chair/wheelchair/BSC Sit to Stand: Contact guard assist     Step pivot transfers: Contact guard assist     General transfer comment: verbal cues for safe hand placement.      Balance Overall balance assessment: Needs assistance Sitting-balance support: No upper extremity supported, Feet supported Sitting balance-Leahy Scale: Good     Standing balance support: Bilateral upper extremity supported, Single extremity supported, During functional activity, Reliant on assistive device for balance Standing balance-Leahy Scale: Fair Standing balance comment: Reliant on RW. No overt LOB                           ADL either performed or assessed with clinical judgement   ADL Overall ADL's : Needs assistance/impaired Eating/Feeding: Independent;Sitting  Grooming: Contact guard assist;Supervision/safety;Standing   Upper Body Bathing: Set up;Sitting   Lower Body Bathing: Minimal assistance;Sit  to/from stand;Sitting/lateral leans   Upper Body Dressing : Set up;Contact guard assist;Sitting Upper Body Dressing Details (indicate cue type and reason): assist for managing lines only Lower Body Dressing: Supervision/safety;Sitting/lateral leans;Minimal assistance;Sit to/from stand Lower Body Dressing Details (indicate cue type and reason): Supervision in sit/lateral leans and Min assist in standing Toilet Transfer: Contact guard assist;Minimal assistance;Ambulation;Rolling walker (2 wheels);BSC/3in1 Toilet Transfer Details (indicate cue type and reason): simulated bed to chair Toileting- Clothing Manipulation and Hygiene: Minimal assistance;Sit to/from stand;Sitting/lateral lean       Functional mobility during ADLs: Minimal assistance;Contact guard assist;Rolling walker (2 wheels) General ADL Comments: Occasional assist for maintaining balance with functional mobility with RW. Pt with decreased activity tolerance and requiring mildly increased time for activities.     Vision Baseline Vision/History: 1 Wears glasses Ability to See in Adequate Light: 0 Adequate (with glasses) Patient Visual Report: No change from baseline Vision Assessment?: No apparent visual deficits Additional Comments: with glasses     Perception         Praxis         Pertinent Vitals/Pain Pain Assessment Pain Assessment: Faces Faces Pain Scale: Hurts little more Pain Location: B LE with pt reporting this is baseline Pain Descriptors / Indicators: Cramping, Tingling Pain Intervention(s): Monitored during session, Repositioned, Limited activity within patient's tolerance     Extremity/Trunk Assessment Upper Extremity Assessment Upper Extremity Assessment: Left hand dominant;Overall [facility] for tasks assessed   Lower Extremity Assessment Lower Extremity Assessment: Defer to PT evaluation   Cervical / Trunk Assessment Cervical / Trunk Assessment: Normal   Communication Communication Communication:  No apparent difficulties   Cognition Arousal: Alert Behavior During Therapy: WFL for tasks assessed/performed Cognition: Cognition impaired     Awareness: Intellectual awareness intact, Online awareness impaired Memory impairment (select all impairments): Working memory Attention impairment (select first level of impairment): Selective attention Executive functioning impairment (select all impairments): Organization, Reasoning, Problem solving OT - Cognition Comments: AAOx4 and pleasant throughout session. Cognitional largely [facility] for tasks assessed with deficits noted above. Pt not challenged with novel tasks this session.                 Following commands: Intact       Cueing  General Comments   Cueing Techniques: Verbal cues      Exercises     Shoulder Instructions      Home Living Family/patient expects to be discharged to:: Private residence Living Arrangements: Alone Available Help at Discharge: Family;Available PRN/intermittently Type of Home: Apartment Home Access: Level entry     Home Layout: One level     Bathroom Shower/Tub: Producer, television/film/video: Standard Bathroom Accessibility: Yes   Home Equipment: Agricultural consultant (2 wheels)          Prior Functioning/Environment Prior Level of Function : Independent/Modified Independent             Mobility Comments: no AD ADLs Comments: reports Indep with ADLs, IADLs in the home; reports he has no hobbies but enjoys watching NASCAR on TV. Sister assists with transportation as needed    OT Problem List: Decreased activity tolerance;Impaired balance (sitting and/or standing);Decreased cognition;Decreased knowledge of use of DME or AE   OT Treatment/Interventions: Self-care/ADL training;Therapeutic exercise;Energy conservation;DME and/or AE instruction;Therapeutic activities;Patient/family education;Balance training      OT Goals(Current goals can be found in the care plan section)  Acute Rehab OT Goals Patient Stated Goal: to return home OT Goal Formulation: With patient Time For Goal Achievement: 08/12/23 Potential to Achieve Goals: Good ADL Goals Pt Will Perform Lower Body Dressing: with modified independence;sitting/lateral leans;sit to/from stand Pt Will Transfer to Toilet: with modified independence;ambulating Additional ADL Goal #1: Pt to increase standing activity tolerance > 15 min during ADLs/mobility before seated rest break needed   OT Frequency:  Min 1X/week    Co-evaluation              AM-PAC OT "6 Clicks" Daily Activity     Outcome Measure Help from another person eating meals?: None Help from another person taking care of personal grooming?: A Little Help from another person toileting, which includes using toliet, bedpan, or urinal?: A Little Help from another person bathing (including washing, rinsing, drying)?: A Little Help from another person to put on and taking off regular upper body clothing?: A Little Help from another person to put on and taking off regular lower body clothing?: A Little 6 Click Score: 19   End of Session Equipment Utilized During Treatment: Gait belt;Rolling walker (2 wheels) Nurse Communication: Mobility status  Activity Tolerance: Patient tolerated treatment well Patient left: in chair;with call bell/phone within reach;with chair alarm set  OT Visit Diagnosis: Unsteadiness on feet (R26.81);Other abnormalities of gait and mobility (R26.89);Other (comment) (decreased activity tolerance)                Time: 1610-9604 OT Time Calculation (min): 24 min Charges:  OT General Charges $OT Visit: 1 Visit OT Evaluation $OT Re-eval: 1 Re-eval OT Treatments $Self Care/Home Management : 8-22 mins  Luccia Reinheimer "Orson Eva., OTR/L, MA Acute Rehab [phone]  Lendon Colonel 07/31/2023, 11:06 AM Ilda Mori" M., OTR/L, MA Acute Rehab [phone]

## 2023-07-31 NOTE — Progress Notes (Signed)
  Echocardiogram 2D Echocardiogram has been performed.  [name] Daniel 07/31/2023, 8:38 AM

## 2023-07-31 NOTE — Progress Notes (Addendum)
 Discussed with pt restrictions, HF management (gave HF booklet), walking as able, and CRPII. Pt somewhat receptive. Will refer to [facility] CRPII. He sts his mother's house probably has a scale. Will sign off. [phone] Ethelda Chick BS, ACSM-CEP 07/31/2023 12:42 PM

## 2023-07-31 NOTE — Progress Notes (Signed)
 Patient ID: [name] Daniel, male   DOB: 10-23-1955, 68 y.o.   MRN: [id number]     Advanced Heart Failure Rounding Note  Cardiologist: Dina Rich, MD  Chief Complaint: Cardiogenic Shock. Subjective:   2/17 Transferred ICU from cath lab. LHC nonobstructive CAD. IABP placed and continued on milrinone. Due to low CO/CI switched to DBA. CT Surgery/Structural Team consulted for aortic stenosis. 2/19: IABP removed.   2/25: s/p  TAVR  Post-TAVR echo with EF 30-35%, LV with GHK, GIDD, RV normal, LA/RA mildly dilated, trivial MR, bioprosthetic AV mean gradient 8 mmHg. Small pericardial effusion  Remains on DBA 2.5 Co-ox 75%   Feeling better today. Denies CP/SOB.   Objective:   Weight Range: 65.3 kg Body mass index is 18.99 kg/m.   Vital Signs:   Temp:  [97.6 F (36.4 C)-98.9 F (37.2 C)] 98.4 F (36.9 C) (02/26 0800) Pulse Rate:  [0-121] 98 (02/26 1000) Resp:  [9-25] 23 (02/26 1000) BP: (79-121)/(59-91) 91/62 (02/26 1000) SpO2:  [88 %-97 %] 93 % (02/26 1000) Arterial Line BP: (84-137)/(55-75) 123/67 (02/26 1000) Weight:  [65.3 kg] 65.3 kg (02/26 0500) Last BM Date : 07/28/23  Weight change: Filed Weights   07/29/23 0500 07/30/23 0500 07/31/23 0500  Weight: 68.2 kg 64.8 kg 65.3 kg   Intake/Output:   Intake/Output Summary (Last 24 hours) at 07/31/2023 1053 Last data filed at 07/31/2023 0800 Gross per 24 hour  Intake 1485.16 ml  Output 1050 ml  Net 435.16 ml    Physical Exam   General:  elderly appearing.  No respiratory difficulty HEENT: normal Neck: supple. JVD ~5 cm.  Cor: PMI nondisplaced. Regular rate & rhythm. No rubs, gallops or murmurs. Lungs: clear Abdomen: soft, nontender, nondistended. Good bowel sounds. Extremities: no cyanosis, clubbing, rash, edema  Neuro: alert & oriented x 3. Moves all 4 extremities w/o difficulty. Affect pleasant.   Telemetry   NSR 80s-90s (Personally reviewed)    Labs   CBC Recent Labs    07/30/23 0440 07/30/23 0443  WBC  4.4  --   HGB 12.3* 12.9*  HCT 37.0* 38.0*  MCV 93.0  --   PLT 175  --    Basic Metabolic Panel Recent Labs    13/08/65 0500 07/30/23 0440 07/30/23 0443 07/31/23 0506  NA 135 136 135  --   K 4.1 3.7 3.9  --   CL 97* 94*  --   --   CO2 29 32  --   --   GLUCOSE 113* 141*  --   --   BUN 28* 31*  --   --   CREATININE 1.16 1.06  --   --   CALCIUM 8.7* 8.8*  --   --   MG 2.1 2.2  --  2.2   Liver Function Tests Recent Labs    07/30/23 0440  AST 35  ALT 30  ALKPHOS 57  BILITOT 0.8  PROT 6.1*  ALBUMIN 3.0*    No results for input(s): "LIPASE", "AMYLASE" in the last 72 hours. Cardiac Enzymes No results for input(s): "CKTOTAL", "CKMB", "CKMBINDEX", "TROPONINI" in the last 72 hours.  BNP: BNP (last 3 results) Recent Labs    06/03/23 2152 07/18/23 1609  BNP 3,234.0* >4,500.0*   ProBNP (last 3 results) No results for input(s): "PROBNP" in the last 8760 hours.  D-Dimer No results for input(s): "DDIMER" in the last 72 hours. Hemoglobin A1C Recent Labs    07/30/23 0440  HGBA1C 7.0*    Fasting Lipid Panel No results  for input(s): "CHOL", "HDL", "LDLCALC", "TRIG", "CHOLHDL", "LDLDIRECT" in the last 72 hours. Thyroid Function Tests No results for input(s): "TSH", "T4TOTAL", "T3FREE", "THYROIDAB" in the last 72 hours.  Invalid input(s): "FREET3"  Other results:  Imaging   ECHOCARDIOGRAM COMPLETE Result Date: 07/31/2023    ECHOCARDIOGRAM REPORT   Patient Name:   [name] Daniel Date of Exam: 07/31/2023 Medical Rec #:  [id number]      Height:       73.0 in Accession #:    [id number]     Weight:       144.0 lb Date of Birth:  1955-09-22      BSA:          1.871 m Patient Age:    67 years       BP:           121/77 mmHg Patient Gender: M              HR:           96 bpm. Exam Location:  Inpatient Procedure: 2D Echo, 3D Echo, Cardiac Doppler, Color Doppler and Strain Analysis            (Both Spectral and Color Flow Doppler were utilized during            procedure).  Indications:    POst TAvr Evaluation  History:        Patient has prior history of Echocardiogram examinations, most                 recent 07/30/2023. Risk Factors:Diabetes and Former Smoker.                 Aortic Valve: 29 mm Edwards Sapien prosthetic, stented (TAVR)                 valve is present in the aortic position.  Sonographer:    Karma Ganja Referring Phys: [id number] KATHRYN R THOMPSON  Sonographer Comments: Global longitudinal strain was attempted. IMPRESSIONS  1. Left ventricular ejection fraction, by estimation, is 30 to 35%. Left ventricular ejection fraction by 3D volume is 35 %. The left ventricle has moderately decreased function. The left ventricle demonstrates global hypokinesis. There is moderate concentric left ventricular hypertrophy. Left ventricular diastolic parameters are consistent with Grade I diastolic dysfunction (impaired relaxation). The average left ventricular global longitudinal strain is -7.0 %. The global longitudinal strain is abnormal.  2. Right ventricular systolic function is normal. The right ventricular size is mildly enlarged. Tricuspid regurgitation signal is inadequate for assessing PA pressure.  3. Left atrial size was mildly dilated.  4. Right atrial size was mildly dilated.  5. The mitral valve is normal in structure. Trivial mitral valve regurgitation. No evidence of mitral stenosis.  6. Bioprosthetic aortic valve s/p TAVR wtih 29 mm Edwards Sapien THV. Mean gradient 8 mmHg. EOA 2.29 cm^2. No perivalvular leakage.  7. Aortic dilatation noted. There is borderline dilatation of the aortic root, measuring 38 mm.  8. The inferior vena cava is normal in size with greater than 50% respiratory variability, suggesting right atrial pressure of 3 mmHg.  9. A small pericardial effusion is present. The pericardial effusion is posterior to the left ventricle. FINDINGS  Left Ventricle: Left ventricular ejection fraction, by estimation, is 30 to 35%. Left ventricular ejection  fraction by 3D volume is 35 %. The left ventricle has moderately decreased function. The left ventricle demonstrates global hypokinesis. The average left ventricular global longitudinal strain is -7.0 %.  Strain was performed and the global longitudinal strain is abnormal. The left ventricular internal cavity size was normal in size. There is moderate concentric left ventricular hypertrophy. Left ventricular diastolic parameters are consistent with Grade I diastolic dysfunction (impaired relaxation). Right Ventricle: The right ventricular size is mildly enlarged. No increase in right ventricular wall thickness. Right ventricular systolic function is normal. Tricuspid regurgitation signal is inadequate for assessing PA pressure. Left Atrium: Left atrial size was mildly dilated. Right Atrium: Right atrial size was mildly dilated. Pericardium: A small pericardial effusion is present. The pericardial effusion is posterior to the left ventricle. Mitral Valve: The mitral valve is normal in structure. There is mild calcification of the mitral valve leaflet(s). Trivial mitral valve regurgitation. No evidence of mitral valve stenosis. MV peak gradient, 6.1 mmHg. The mean mitral valve gradient is 3.0  mmHg. Tricuspid Valve: The tricuspid valve is normal in structure. Tricuspid valve regurgitation is trivial. Aortic Valve: Bioprosthetic aortic valve s/p TAVR wtih 29 mm Edwards Sapien THV. Mean gradient 8 mmHg. EOA 2.29 cm^2. No perivalvular leakage. The aortic valve has been repaired/replaced. Aortic valve regurgitation is not visualized. Aortic valve mean gradient measures 8.0 mmHg. Aortic valve peak gradient measures 15.1 mmHg. Aortic valve area, by VTI measures 2.29 cm. There is a 29 mm Edwards Sapien prosthetic, stented (TAVR) valve present in the aortic position. Pulmonic Valve: The pulmonic valve was normal in structure. Pulmonic valve regurgitation is trivial. Aorta: Aortic dilatation noted. There is borderline  dilatation of the aortic root, measuring 38 mm. Venous: The inferior vena cava is normal in size with greater than 50% respiratory variability, suggesting right atrial pressure of 3 mmHg. IAS/Shunts: No atrial level shunt detected by color flow Doppler. Additional Comments: 3D was performed not requiring image post processing on an independent workstation and was abnormal.  LEFT VENTRICLE PLAX 2D LVIDd:         4.75 cm         Diastology LVIDs:         4.50 cm         LV e' medial:    6.22 cm/s LV PW:         1.20 cm         LV E/e' medial:  13.3 LV IVS:        1.20 cm         LV e' lateral:   8.55 cm/s LVOT diam:     2.80 cm         LV E/e' lateral: 9.7 LV SV:         70 LV SV Index:   37              2D LVOT Area:     6.16 cm        Longitudinal                                Strain                                2D Strain GLS  -7.0 % LV Volumes (MOD)               Avg: LV vol d, MOD    188.0 ml A2C:  3D Volume EF LV vol d, MOD    196.0 ml      LV 3D EF:    Left A4C:                                        ventricul LV vol s, MOD    123.0 ml                   ar A2C:                                        ejection LV vol s, MOD    122.0 ml                   fraction A4C:                                        by 3D LV SV MOD A2C:   65.0 ml                    volume is LV SV MOD A4C:   196.0 ml                   35 %. LV SV MOD BP:    79.9 ml                                 3D Volume EF:                                3D EF:        35 %                                LV EDV:       218 ml                                LV ESV:       143 ml                                LV SV:        75 ml RIGHT VENTRICLE             IVC RV Basal diam:  5.50 cm     IVC diam: 1.40 cm RV S prime:     14.90 cm/s TAPSE (M-mode): 2.2 cm LEFT ATRIUM             Index        RIGHT ATRIUM           Index LA diam:        3.80 cm 2.03 cm/m   RA Area:     33.70 cm LA Vol (A2C):   97.9 ml 52.32 ml/m  RA Volume:    145.00 ml 77.50 ml/m LA Vol (A4C):   45.6 ml 24.37 ml/m LA  Biplane Vol: 65.8 ml 35.17 ml/m  AORTIC VALVE AV Area (Vmax):    2.80 cm AV Area (Vmean):   2.45 cm AV Area (VTI):     2.29 cm AV Vmax:           194.00 cm/s AV Vmean:          133.000 cm/s AV VTI:            0.304 m AV Peak Grad:      15.1 mmHg AV Mean Grad:      8.0 mmHg LVOT Vmax:         88.10 cm/s LVOT Vmean:        52.900 cm/s LVOT VTI:          0.113 m LVOT/AV VTI ratio: 0.37  AORTA Ao Root diam: 3.80 cm Ao Asc diam:  3.70 cm MITRAL VALVE MV Area (PHT): 5.23 cm     SHUNTS MV Area VTI:   2.63 cm     Systemic VTI:  0.11 m MV Peak grad:  6.1 mmHg     Systemic Diam: 2.80 cm MV Mean grad:  3.0 mmHg MV Vmax:       1.23 m/s MV Vmean:      77.3 cm/s MV Decel Time: 145 msec MV E velocity: 83.00 cm/s MV A velocity: 107.00 cm/s MV E/A ratio:  0.78 Dalton McleanMD Electronically signed by Wilfred Lacy Signature Date/Time: 07/31/2023/8:49:27 AM    Final    Structural Heart Procedure Result Date: 07/30/2023 See surgical note for result.  ECHOCARDIOGRAM LIMITED Result Date: 07/30/2023    ECHOCARDIOGRAM LIMITED REPORT   Patient Name:   [name] Daniel Date of Exam: 07/30/2023 Medical Rec #:  [id number]      Height:       73.0 in Accession #:    [id number]     Weight:       142.9 lb Date of Birth:  06-May-1956      BSA:          1.865 m Patient Age:    67 years       BP:           109/91 mmHg Patient Gender: M              HR:           103 bpm. Exam Location:  Inpatient Procedure: Limited Echo, Cardiac Doppler and Color Doppler (Both Spectral and            Color Flow Doppler were utilized during procedure). Indications:    TAVR  History:        Patient has prior history of Echocardiogram examinations, most                 recent 06/04/2023. CHF; Aortic Valve Disease.  Sonographer:    Darlys Gales Referring Phys: [id number] KATHRYN R THOMPSON IMPRESSIONS  1. Left ventricular ejection fraction, by estimation, is 20 to 25%. The left ventricle has severely  decreased function. The left ventricle demonstrates global hypokinesis. The left ventricular internal cavity size was severely dilated. There is mild left ventricular hypertrophy.  2. The mitral valve is normal in structure. Mild mitral valve regurgitation.  3. Pre TAVR: in setting of severe LV dysfunction Tri leaflet AV calcified with restricted leaflet motion. Mild/moderate AR mean gradient 33 peak 52 mmHg AVA 0.64 cm2     Post TAVR well placed 29 mm Sapien vavle mean gradient 3 peak 4 mm Hg trivial PVL seen  at 5:00 on basal SA images AVA 2.72 cm2  4. Right ventricular systolic function is moderately reduced. The right ventricular size is moderately enlarged. FINDINGS  Left Ventricle: Left ventricular ejection fraction, by estimation, is 20 to 25%. The left ventricle has severely decreased function. The left ventricle demonstrates global hypokinesis. The left ventricular internal cavity size was severely dilated. There is mild left ventricular hypertrophy. Right Ventricle: The right ventricular size is moderately enlarged. Right ventricular systolic function is moderately reduced. Pericardium: Trivial pericardial effusion is present. The pericardial effusion is posterior to the left ventricle, posterior and lateral to the left ventricle and localized near the right atrium. Mitral Valve: The mitral valve is normal in structure. Mild mitral valve regurgitation. Tricuspid Valve: The tricuspid valve is normal in structure. Tricuspid valve regurgitation is mild. Aortic Valve: Pre TAVR: in setting of severe LV dysfunction Tri leaflet AV calcified with restricted leaflet motion. Mild/moderate AR mean gradient 33 peak 52 mmHg AVA 0.64 cm2 Post TAVR well placed 29 mm Sapien vavle mean gradient 3 peak 4 mm Hg trivial PVL seen at 5:00 on basal SA images AVA 2.7 cm2. The aortic valve has been repaired/replaced. Aortic valve regurgitation is not visualized. No aortic stenosis is present. Aortic valve mean gradient measures  18.0 mmHg. Aortic valve peak gradient measures 28.3 mmHg. Aortic valve area, by VTI measures 1.01 cm. LEFT VENTRICLE PLAX 2D LVIDd:         5.20 cm LVIDs:         5.00 cm LV PW:         1.50 cm LV IVS:        1.30 cm LVOT diam:     2.00 cm LV SV:         47 LV SV Index:   25 LVOT Area:     3.14 cm  AORTIC VALVE AV Area (Vmax):    0.95 cm AV Area (Vmean):   1.10 cm AV Area (VTI):     1.01 cm AV Vmax:           266.00 cm/s AV Vmean:          170.200 cm/s AV VTI:            0.463 m AV Peak Grad:      28.3 mmHg AV Mean Grad:      18.0 mmHg LVOT Vmax:         80.60 cm/s LVOT Vmean:        59.750 cm/s LVOT VTI:          0.150 m LVOT/AV VTI ratio: 0.32  SHUNTS Systemic VTI:  0.15 m Systemic Diam: 2.00 cm Charlton Haws MD Electronically signed by Charlton Haws MD Signature Date/Time: 07/30/2023/5:46:59 PM    Final       Medications:   Scheduled Medications:  aspirin EC  81 mg Oral Daily   Chlorhexidine Gluconate Cloth  6 each Topical Daily   cyanocobalamin  1,000 mcg Oral Daily   digoxin  0.125 mg Oral Daily   enoxaparin (LOVENOX) injection  40 mg Subcutaneous Q24H   feeding supplement  237 mL Oral TID BM   folic acid  1 mg Oral Daily   multivitamin with minerals  1 tablet Oral Daily   rosuvastatin  20 mg Oral Daily   sodium chloride flush  10-40 mL Intracatheter Q12H   sodium chloride flush  3 mL Intravenous Q12H   sodium chloride flush  3 mL Intravenous Q12H   thiamine  100  mg Oral Daily   traZODone  100 mg Oral QHS    Infusions:  sodium chloride 50 mL/hr at 07/31/23 0800   sodium chloride      ceFAZolin (ANCEF) IV Stopped (07/31/23 0730)   nitroGLYCERIN      PRN Medications: sodium chloride, acetaminophen, magnesium hydroxide, morphine injection, ondansetron (ZOFRAN) IV, mouth rinse, oxyCODONE, sodium chloride flush, sodium chloride flush, sodium chloride flush, traMADol  Assessment/Plan  1. Acute on chronic systolic CHF/cardiogenic shock: Echo in 12/24 showed EF 20%, mild RV  dysfunction, severe low flow/low gradient aortic stenosis with mean gradient 35 mmHg and AVA 0.49 cm^2. 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  He has severe aortic stenosis.  He also has anterior Qs on ECG so prior MI is certainly possible. He was re-admitted with marked volume overload and low cardiac output, co-ox 38%.  He was started on milrinone which was increased to 0.375 and diuresed with lasix drip. - 2/17 cath - IABP placed and continued on Milrinone. Due to low CO/CI he was switched to Dobutamine. 2/19 IABP removed. - Now on DBA in setting of severe LV dysfunction and severe AS - Stop DBA today. Co-ox 75% Volume stable, does not need additional diuretic at this time - Post-TAVR echo with EF 30-35%, LV with GHK, GIDD, RV normal, LA/RA mildly dilated, trivial MR, bioprosthetic AV mean gradient 8 mmHg. Small pericardial effusion - Continue spiro 12.5 mg daily with soft BP.  - Continue digoxin 0.125 daily.  - HgbA1c 7. Start Farxiga 10 mg daily - No b-blocker with shock  2. Elevated troponin: Mild elevation with no trend. Suspect demand ischemia with volume overload.  - LHC 2/17 with nonobstructive CAD - Continue statin - Continue ASA 81 - No s/s angina  3. Aortic stenosis: Low flow/low gradient severe AS on 12/24 echo. This contributes to CHF.  He will ultimately need the aortic valve replaced.  - CT Surgery/Structural Team following. - TAVR CTs done 07/25/23. - Now s/p TAVR 2/25  4. Thrombocytopenia: Platelets have been mildly low since 12/24.  Follow.  - PLT count 116K -> 139K> 175K  5. Ascending aortic aneurysm: 5.0 cm on 12/24 CTA chest.  - not surgical candidate  6. H/o ETOH abuse: Not very forthcoming on current drinking habits but apparently has quit for about 6 months.   7. Deconditioning - PT/OT following  8. Hyponatremia - Resolved  9. + UTI and RLL PNA on CT - PCT < 0.10 - Zosyn started 2/22. Last dose 2/24.   CRITICAL CARE Performed by: Alen Bleacher  Total  critical care time: 14 minutes  Critical care time was exclusive of separately billable procedures and treating other patients.  Critical care was necessary to treat or prevent imminent or life-threatening deterioration.  Critical care was time spent personally by me on the following activities: development of treatment plan with patient and/or surrogate as well as nursing, discussions with consultants, evaluation of patient's response to treatment, examination of patient, obtaining history from patient or surrogate, ordering and performing treatments and interventions, ordering and review of laboratory studies, ordering and review of radiographic studies, pulse oximetry and re-evaluation of patient's condition.   Length of Stay: [address], [place]  07/31/2023, 10:53 AM  Advanced Heart Failure Team Pager [phone] (M-F; 7a - 5p)

## 2023-08-01 ENCOUNTER — Other Ambulatory Visit (HOSPITAL_COMMUNITY): Payer: Self-pay

## 2023-08-01 ENCOUNTER — Telehealth (HOSPITAL_COMMUNITY): Payer: Self-pay | Admitting: Pharmacy Technician

## 2023-08-01 LAB — BASIC METABOLIC PANEL
Anion gap: 8 (ref 5–15)
BUN: 20 mg/dL (ref 8–23)
CO2: 29 mmol/L (ref 22–32)
Calcium: 8.9 mg/dL (ref 8.9–10.3)
Chloride: 98 mmol/L (ref 98–111)
Creatinine, Ser: 0.94 mg/dL (ref 0.61–1.24)
GFR, Estimated: >60 mL/min (ref >60)
Glucose, Bld: 111 mg/dL — ABNORMAL HIGH (ref 70–99)
Potassium: 4.5 mmol/L (ref 3.5–5.1)
Sodium: 135 mmol/L (ref 135–145)

## 2023-08-01 LAB — CBC
HCT: 35.1 % — ABNORMAL LOW (ref 39.0–52.0)
Hemoglobin: 11.5 g/dL — ABNORMAL LOW (ref 13.0–17.0)
MCH: 30.6 pg (ref 26.0–34.0)
MCHC: 32.8 g/dL (ref 30.0–36.0)
MCV: 93.4 fL (ref 80.0–100.0)
Platelets: 164 10*3/uL (ref 150–400)
RBC: 3.76 MIL/uL — ABNORMAL LOW (ref 4.22–5.81)
RDW: 16.3 % — ABNORMAL HIGH (ref 11.5–15.5)
WBC: 6.8 10*3/uL (ref 4.0–10.5)
nRBC: 0 % (ref 0.0–0.2)

## 2023-08-01 LAB — COOXEMETRY PANEL
Carboxyhemoglobin: 1.8 % — ABNORMAL HIGH (ref 0.5–1.5)
Methemoglobin: 1 % (ref 0.0–1.5)
O2 Saturation: 65.7 %
Total hemoglobin: 12.3 g/dL (ref 12.0–16.0)

## 2023-08-01 LAB — MAGNESIUM: Magnesium: 2.2 mg/dL (ref 1.7–2.4)

## 2023-08-01 MED ORDER — EMPAGLIFLOZIN 10 MG PO TABS
10.0000 mg | ORAL_TABLET | Freq: Every day | ORAL | 5 refills | Status: AC
  Filled 2023-08-01: qty 30, 30d supply, fill #0
  Filled 2023-08-29: qty 30, 30d supply, fill #1

## 2023-08-01 MED ORDER — LOSARTAN POTASSIUM 25 MG PO TABS
12.5000 mg | ORAL_TABLET | Freq: Every day | ORAL | 5 refills | Status: DC
  Filled 2023-08-01: qty 15, 30d supply, fill #0

## 2023-08-01 MED ORDER — ADULT MULTIVITAMIN W/MINERALS CH
1.0000 | ORAL_TABLET | Freq: Every day | ORAL | 5 refills | Status: DC
  Filled 2023-08-01: qty 30, 30d supply, fill #0

## 2023-08-01 MED ORDER — TRAZODONE HCL 50 MG PO TABS
50.0000 mg | ORAL_TABLET | Freq: Every day | ORAL | 0 refills | Status: DC
Start: 2023-08-01 — End: 2023-08-29
  Filled 2023-08-01: qty 30, 30d supply, fill #0

## 2023-08-01 MED ORDER — ROSUVASTATIN CALCIUM 20 MG PO TABS
20.0000 mg | ORAL_TABLET | Freq: Every day | ORAL | 5 refills | Status: DC
Start: 2023-08-02 — End: 2023-08-20
  Filled 2023-08-01: qty 30, 30d supply, fill #0

## 2023-08-01 MED ORDER — SPIRONOLACTONE 25 MG PO TABS
12.5000 mg | ORAL_TABLET | Freq: Every day | ORAL | 5 refills | Status: DC
  Filled 2023-08-01: qty 15, 30d supply, fill #0

## 2023-08-01 MED ORDER — DIGOXIN 125 MCG PO TABS
0.1250 mg | ORAL_TABLET | Freq: Every day | ORAL | 5 refills | Status: DC
  Filled 2023-08-01: qty 30, 30d supply, fill #0

## 2023-08-01 MED ORDER — THIAMINE HCL 100 MG PO TABS
100.0000 mg | ORAL_TABLET | Freq: Every day | ORAL | 5 refills | Status: AC
  Filled 2023-08-01: qty 30, 30d supply, fill #0
  Filled 2023-08-29 (×2): qty 30, 30d supply, fill #1
  Filled 2023-10-07: qty 29, 29d supply, fill #2
  Filled 2023-10-31: qty 29, 29d supply, fill #3

## 2023-08-01 MED ORDER — LOSARTAN POTASSIUM 25 MG PO TABS
12.5000 mg | ORAL_TABLET | Freq: Every day | ORAL | Status: DC
  Administered 2023-08-01: 12.5 mg via ORAL
  Filled 2023-08-01: qty 1

## 2023-08-01 MED ORDER — EMPAGLIFLOZIN 10 MG PO TABS
10.0000 mg | ORAL_TABLET | Freq: Every day | ORAL | Status: DC
  Administered 2023-08-01: 10 mg via ORAL
  Filled 2023-08-01: qty 1

## 2023-08-01 MED ORDER — ASPIRIN 81 MG PO TBEC
81.0000 mg | DELAYED_RELEASE_TABLET | Freq: Every day | ORAL | 12 refills | Status: AC
  Filled 2023-08-01: qty 30, 30d supply, fill #0
  Filled 2023-08-29 (×2): qty 30, 30d supply, fill #1
  Filled 2023-09-26: qty 30, 30d supply, fill #2
  Filled 2023-10-22: qty 30, 30d supply, fill #3
  Filled 2023-11-14: qty 30, 30d supply, fill #4
  Filled 2023-12-25: qty 30, 30d supply, fill #5
  Filled 2024-01-23: qty 30, 30d supply, fill #6
  Filled 2024-02-21: qty 30, 30d supply, fill #7
  Filled 2024-03-23 (×2): qty 30, 30d supply, fill #8
  Filled 2024-04-21: qty 30, 30d supply, fill #0
  Filled 2024-05-14: qty 30, 30d supply, fill #1
  Filled 2024-06-22: qty 30, 30d supply, fill #2

## 2023-08-01 NOTE — Progress Notes (Signed)
 CVAD removed per protocol per MD order. Manual pressure applied for 3 mins. Vaseline gauze, gauze, and Tegaderm applied over insertion site. No bleeding or swelling noted. Instructed patient to remain in bed for thirty mins. Educated patient about S/S of infection and when to call MD; no heavy lifting or pressure on R side for 24 hours; keep dressing dry and intact for 24 hours. Pt verbalized comprehension.

## 2023-08-01 NOTE — Progress Notes (Deleted)
 error

## 2023-08-01 NOTE — Telephone Encounter (Signed)
 Advanced Heart Failure Patient Advocate Encounter  Received notification that patient needed assistance with [employer]. He does not currently have RX coverage. Started application for [facility].  Will send in once all signatures are obtained.

## 2023-08-01 NOTE — Discharge Summary (Signed)
 Advanced Heart Failure Team  Discharge Summary   Patient ID: [name] Daniel MRN: [id number], DOB/AGE: 1955/07/17 68 y.o. Admit date: 07/18/2023 D/C date:     08/01/2023   Primary Discharge Diagnoses:  Acute on chronic systolic heart failure Cardiogenic shock Elevated troponin Aortic stenosis  Secondary Discharge Diagnoses:  Thrombocytopenia Ascending aortic aneurysm H/o ETOH abuse Deconditioning Hyponatremia UTI RLL PNA  Hospital Course:  [name] is a 68 y.o. male with recently diagnosed CHF and aortic stenosis.    Patient had minimal medical history prior to 12/24.  In early 12/24, he had repair of an incarcerated inguinal hernia with SBO.  He was then admitted in late 12/24 to [facility] with acute systolic CHF and was diuresed.  Echo at that time showed EF 20%, mild RV dysfunction, severe low flow/low gradient aortic stenosis with mean gradient 35 mmHg and AVA 0.49 cm^2. He refused transfer to [facility] for further workup and discharged home on po Lasix and atenolol.  He saw his surgeon for a post-op appointment and was noted to be short of breath, surgeon sent him to the ER.  In the ER, he was noted to be markedly volume overloaded and was admitted to [facility]. Lactate was found to be elevated at 3.  He was started on Lasix gtt and transferred to [facility].   Once at [facility] a PICC was placed and his initial co-ox was 38%. Milrinone was started.  He diuresed well with lasix gtt. He was taken for a L/RHC which showed RA 5 PA 30/12 (22), PCWP 11, PA 59%, CO/CI 3.8/2, PVR 2.9 WU, PAPi 3.6, Mid Cx 40% stenosed 1st obtuse lesion 50% stenosed. IABP placed. With low CO/CI Milrinone stopped and transitioned to DBA. Structural team consulted for aortic stenosis. IABP removed 07/24/23. Decision made to proceed with TAVR. 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 DBA turned off the next day, co-ox has been stable. Has tolerated addition of GDMT, slightly limited with soft BP. Plan for Toprol XL at f/u.   Pt  will continue to be followed closely in the HF clinic. Dr Elwyn Lade evaluated and deemed appropriate for discharge. Has 1 month PHF with structural team. Provided patient with pill box and scale.   See below for detailed problem list: 1. Acute on chronic systolic CHF/cardiogenic shock: Echo in 12/24 showed EF 20%, mild RV dysfunction, severe low flow/low gradient aortic stenosis with mean gradient 35 mmHg and AVA 0.49 cm^2. 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  He has severe aortic stenosis.  He also has anterior Qs on ECG so prior MI is certainly possible. He was re-admitted with marked volume overload and low cardiac output, co-ox 38%.  He was started on milrinone which was increased to 0.375 and diuresed with lasix drip. - 2/17 cath - IABP placed and continued on Milrinone. Due to low CO/CI he was switched to Dobutamine. 2/19 IABP removed. - Placed on DBA in setting of severe LV dysfunction and severe AS - Stable off DBA. Co-ox 66%. Does not need additional diuretic with addition of GDMT.  - Post-TAVR echo with EF 30-35%, LV with GHK, GIDD, RV normal, LA/RA mildly dilated, trivial MR, bioprosthetic AV mean gradient 8 mmHg. Small pericardial effusion - Continue spiro 12.5 mg daily with soft BP.  - Continue digoxin 0.125 daily.  - HgbA1c 7. Continue Farxiga 10 mg daily - Start losartan 12.5 mg daily - Plan to add Toprol XL at f/u - PICC line removed at discharge - Worry about compliance as  he seems a little forgetful. Paramedicine would be great if he qualifies for program.  2. Elevated troponin: Mild elevation with no trend. Suspect demand ischemia with volume overload.  - LHC 2/17 with nonobstructive CAD - Continue statin - Continue ASA 81 - No s/s angina 3. Aortic stenosis: Low flow/low gradient severe AS on 12/24 echo. This contributes to CHF.   - CT Surgery/Structural Team following. TAVR CTs done 07/25/23. - Now s/p TAVR 2/25 4. Thrombocytopenia: Platelets have been mildly low since 12/24.   Follow.  - PLT count 116K -> 139K> back WNL 5. Ascending aortic aneurysm: 5.0 cm on 12/24 CTA chest.  - not surgical candidate 6. H/o ETOH abuse: Not very forthcoming on current drinking habits but apparently has quit for about 6 months.  7. Deconditioning - PT/OT following - outpatient rehab referral sent 8. Hyponatremia - Resolved 9. + UTI and RLL PNA on CT - PCT < 0.10 - Zosyn started 2/22. Last dose 2/24.    Discharge Weight:  Discharge Vitals: Blood pressure 111/64, pulse 99, temperature 98.4 F (36.9 C), temperature source Oral, resp. rate 19, height 6\' 1"  (1.854 m), weight 65.3 kg, SpO2 95%.  Labs: Lab Results  Component Value Date   WBC 6.8 08/01/2023   HGB 11.5 (L) 08/01/2023   HCT 35.1 (L) 08/01/2023   MCV 93.4 08/01/2023   PLT 164 08/01/2023    Recent Labs  Lab 07/30/23 0440 07/30/23 0443 08/01/23 0511  NA 136   < > 135  K 3.7   < > 4.5  CL 94*   < > 98  CO2 32   < > 29  BUN 31*   < > 20  CREATININE 1.06   < > 0.94  CALCIUM 8.8*   < > 8.9  PROT 6.1*  --   --   BILITOT 0.8  --   --   ALKPHOS 57  --   --   ALT 30  --   --   AST 35  --   --   GLUCOSE 141*   < > 111*   < > = values in this interval not displayed.   Lab Results  Component Value Date   CHOL 146 12/31/2019   HDL 47 12/31/2019   LDLCALC 90 12/31/2019   TRIG 44 12/31/2019   BNP (last 3 results) Recent Labs    06/03/23 2152 07/18/23 1609  BNP 3,234.0* >4,500.0*    ProBNP (last 3 results) No results for input(s): "PROBNP" in the last 8760 hours.   Diagnostic Studies/Procedures   ECHOCARDIOGRAM COMPLETE Result Date: 07/31/2023    ECHOCARDIOGRAM REPORT   Patient Name:   [name] Daniel Date of Exam: 07/31/2023 Medical Rec #:  [id number]      Height:       73.0 in Accession #:    [id number]     Weight:       144.0 lb Date of Birth:  1955/10/29      BSA:          1.871 m Patient Age:    67 years       BP:           121/77 mmHg Patient Gender: M              HR:           96 bpm. Exam  Location:  Inpatient Procedure: 2D Echo, 3D Echo, Cardiac Doppler, Color Doppler and Strain Analysis            (  Both Spectral and Color Flow Doppler were utilized during            procedure). Indications:    POst TAvr Evaluation  History:        Patient has prior history of Echocardiogram examinations, most                 recent 07/30/2023. Risk Factors:Diabetes and Former Smoker.                 Aortic Valve: 29 mm Edwards Sapien prosthetic, stented (TAVR)                 valve is present in the aortic position.  Sonographer:    Karma Ganja Referring Phys: [id number] KATHRYN R THOMPSON  Sonographer Comments: Global longitudinal strain was attempted. IMPRESSIONS  1. Left ventricular ejection fraction, by estimation, is 30 to 35%. Left ventricular ejection fraction by 3D volume is 35 %. The left ventricle has moderately decreased function. The left ventricle demonstrates global hypokinesis. There is moderate concentric left ventricular hypertrophy. Left ventricular diastolic parameters are consistent with Grade I diastolic dysfunction (impaired relaxation). The average left ventricular global longitudinal strain is -7.0 %. The global longitudinal strain is abnormal.  2. Right ventricular systolic function is normal. The right ventricular size is mildly enlarged. Tricuspid regurgitation signal is inadequate for assessing PA pressure.  3. Left atrial size was mildly dilated.  4. Right atrial size was mildly dilated.  5. The mitral valve is normal in structure. Trivial mitral valve regurgitation. No evidence of mitral stenosis.  6. Bioprosthetic aortic valve s/p TAVR wtih 29 mm Edwards Sapien THV. Mean gradient 8 mmHg. EOA 2.29 cm^2. No perivalvular leakage.  7. Aortic dilatation noted. There is borderline dilatation of the aortic root, measuring 38 mm.  8. The inferior vena cava is normal in size with greater than 50% respiratory variability, suggesting right atrial pressure of 3 mmHg.  9. A small pericardial effusion  is present. The pericardial effusion is posterior to the left ventricle. FINDINGS  Left Ventricle: Left ventricular ejection fraction, by estimation, is 30 to 35%. Left ventricular ejection fraction by 3D volume is 35 %. The left ventricle has moderately decreased function. The left ventricle demonstrates global hypokinesis. The average left ventricular global longitudinal strain is -7.0 %. Strain was performed and the global longitudinal strain is abnormal. The left ventricular internal cavity size was normal in size. There is moderate concentric left ventricular hypertrophy. Left ventricular diastolic parameters are consistent with Grade I diastolic dysfunction (impaired relaxation). Right Ventricle: The right ventricular size is mildly enlarged. No increase in right ventricular wall thickness. Right ventricular systolic function is normal. Tricuspid regurgitation signal is inadequate for assessing PA pressure. Left Atrium: Left atrial size was mildly dilated. Right Atrium: Right atrial size was mildly dilated. Pericardium: A small pericardial effusion is present. The pericardial effusion is posterior to the left ventricle. Mitral Valve: The mitral valve is normal in structure. There is mild calcification of the mitral valve leaflet(s). Trivial mitral valve regurgitation. No evidence of mitral valve stenosis. MV peak gradient, 6.1 mmHg. The mean mitral valve gradient is 3.0  mmHg. Tricuspid Valve: The tricuspid valve is normal in structure. Tricuspid valve regurgitation is trivial. Aortic Valve: Bioprosthetic aortic valve s/p TAVR wtih 29 mm Edwards Sapien THV. Mean gradient 8 mmHg. EOA 2.29 cm^2. No perivalvular leakage. The aortic valve has been repaired/replaced. Aortic valve regurgitation is not visualized. Aortic valve mean gradient measures 8.0 mmHg. Aortic valve peak gradient measures  15.1 mmHg. Aortic valve area, by VTI measures 2.29 cm. There is a 29 mm Edwards Sapien prosthetic, stented (TAVR) valve  present in the aortic position. Pulmonic Valve: The pulmonic valve was normal in structure. Pulmonic valve regurgitation is trivial. Aorta: Aortic dilatation noted. There is borderline dilatation of the aortic root, measuring 38 mm. Venous: The inferior vena cava is normal in size with greater than 50% respiratory variability, suggesting right atrial pressure of 3 mmHg. IAS/Shunts: No atrial level shunt detected by color flow Doppler. Additional Comments: 3D was performed not requiring image post processing on an independent workstation and was abnormal.  LEFT VENTRICLE PLAX 2D LVIDd:         4.75 cm         Diastology LVIDs:         4.50 cm         LV e' medial:    6.22 cm/s LV PW:         1.20 cm         LV E/e' medial:  13.3 LV IVS:        1.20 cm         LV e' lateral:   8.55 cm/s LVOT diam:     2.80 cm         LV E/e' lateral: 9.7 LV SV:         70 LV SV Index:   37              2D LVOT Area:     6.16 cm        Longitudinal                                Strain                                2D Strain GLS  -7.0 % LV Volumes (MOD)               Avg: LV vol d, MOD    188.0 ml A2C:                           3D Volume EF LV vol d, MOD    196.0 ml      LV 3D EF:    Left A4C:                                        ventricul LV vol s, MOD    123.0 ml                   ar A2C:                                        ejection LV vol s, MOD    122.0 ml                   fraction A4C:  by 3D LV SV MOD A2C:   65.0 ml                    volume is LV SV MOD A4C:   196.0 ml                   35 %. LV SV MOD BP:    79.9 ml                                 3D Volume EF:                                3D EF:        35 %                                LV EDV:       218 ml                                LV ESV:       143 ml                                LV SV:        75 ml RIGHT VENTRICLE             IVC RV Basal diam:  5.50 cm     IVC diam: 1.40 cm RV S prime:     14.90 cm/s TAPSE (M-mode):  2.2 cm LEFT ATRIUM             Index        RIGHT ATRIUM           Index LA diam:        3.80 cm 2.03 cm/m   RA Area:     33.70 cm LA Vol (A2C):   97.9 ml 52.32 ml/m  RA Volume:   145.00 ml 77.50 ml/m LA Vol (A4C):   45.6 ml 24.37 ml/m LA Biplane Vol: 65.8 ml 35.17 ml/m  AORTIC VALVE AV Area (Vmax):    2.80 cm AV Area (Vmean):   2.45 cm AV Area (VTI):     2.29 cm AV Vmax:           194.00 cm/s AV Vmean:          133.000 cm/s AV VTI:            0.304 m AV Peak Grad:      15.1 mmHg AV Mean Grad:      8.0 mmHg LVOT Vmax:         88.10 cm/s LVOT Vmean:        52.900 cm/s LVOT VTI:          0.113 m LVOT/AV VTI ratio: 0.37  AORTA Ao Root diam: 3.80 cm Ao Asc diam:  3.70 cm MITRAL VALVE MV Area (PHT): 5.23 cm     SHUNTS MV Area VTI:   2.63 cm     Systemic VTI:  0.11 m MV Peak grad:  6.1 mmHg     Systemic Diam: 2.80 cm MV Mean grad:  3.0 mmHg MV Vmax:  1.23 m/s MV Vmean:      77.3 cm/s MV Decel Time: 145 msec MV E velocity: 83.00 cm/s MV A velocity: 107.00 cm/s MV E/A ratio:  0.78 Dalton McleanMD Electronically signed by Wilfred Lacy Signature Date/Time: 07/31/2023/8:49:27 AM    Final    Structural Heart Procedure Result Date: 07/30/2023 See surgical note for result.  ECHOCARDIOGRAM LIMITED Result Date: 07/30/2023    ECHOCARDIOGRAM LIMITED REPORT   Patient Name:   DEKLAN MINAR Date of Exam: 07/30/2023 Medical Rec #:  [id number]      Height:       73.0 in Accession #:    [id number]     Weight:       142.9 lb Date of Birth:  11/26/1955      BSA:          1.865 m Patient Age:    67 years       BP:           109/91 mmHg Patient Gender: M              HR:           103 bpm. Exam Location:  Inpatient Procedure: Limited Echo, Cardiac Doppler and Color Doppler (Both Spectral and            Color Flow Doppler were utilized during procedure). Indications:    TAVR  History:        Patient has prior history of Echocardiogram examinations, most                 recent 06/04/2023. CHF; Aortic Valve Disease.   Sonographer:    Darlys Gales Referring Phys: [id number] KATHRYN R THOMPSON IMPRESSIONS  1. Left ventricular ejection fraction, by estimation, is 20 to 25%. The left ventricle has severely decreased function. The left ventricle demonstrates global hypokinesis. The left ventricular internal cavity size was severely dilated. There is mild left ventricular hypertrophy.  2. The mitral valve is normal in structure. Mild mitral valve regurgitation.  3. Pre TAVR: in setting of severe LV dysfunction Tri leaflet AV calcified with restricted leaflet motion. Mild/moderate AR mean gradient 33 peak 52 mmHg AVA 0.64 cm2     Post TAVR well placed 29 mm Sapien vavle mean gradient 3 peak 4 mm Hg trivial PVL seen at 5:00 on basal SA images AVA 2.72 cm2  4. Right ventricular systolic function is moderately reduced. The right ventricular size is moderately enlarged. FINDINGS  Left Ventricle: Left ventricular ejection fraction, by estimation, is 20 to 25%. The left ventricle has severely decreased function. The left ventricle demonstrates global hypokinesis. The left ventricular internal cavity size was severely dilated. There is mild left ventricular hypertrophy. Right Ventricle: The right ventricular size is moderately enlarged. Right ventricular systolic function is moderately reduced. Pericardium: Trivial pericardial effusion is present. The pericardial effusion is posterior to the left ventricle, posterior and lateral to the left ventricle and localized near the right atrium. Mitral Valve: The mitral valve is normal in structure. Mild mitral valve regurgitation. Tricuspid Valve: The tricuspid valve is normal in structure. Tricuspid valve regurgitation is mild. Aortic Valve: Pre TAVR: in setting of severe LV dysfunction Tri leaflet AV calcified with restricted leaflet motion. Mild/moderate AR mean gradient 33 peak 52 mmHg AVA 0.64 cm2 Post TAVR well placed 29 mm Sapien vavle mean gradient 3 peak 4 mm Hg trivial PVL seen at 5:00 on basal  SA images AVA 2.7 cm2. The aortic valve has been repaired/replaced. Aortic valve regurgitation  is not visualized. No aortic stenosis is present. Aortic valve mean gradient measures 18.0 mmHg. Aortic valve peak gradient measures 28.3 mmHg. Aortic valve area, by VTI measures 1.01 cm. LEFT VENTRICLE PLAX 2D LVIDd:         5.20 cm LVIDs:         5.00 cm LV PW:         1.50 cm LV IVS:        1.30 cm LVOT diam:     2.00 cm LV SV:         47 LV SV Index:   25 LVOT Area:     3.14 cm  AORTIC VALVE AV Area (Vmax):    0.95 cm AV Area (Vmean):   1.10 cm AV Area (VTI):     1.01 cm AV Vmax:           266.00 cm/s AV Vmean:          170.200 cm/s AV VTI:            0.463 m AV Peak Grad:      28.3 mmHg AV Mean Grad:      18.0 mmHg LVOT Vmax:         80.60 cm/s LVOT Vmean:        59.750 cm/s LVOT VTI:          0.150 m LVOT/AV VTI ratio: 0.32  SHUNTS Systemic VTI:  0.15 m Systemic Diam: 2.00 cm Charlton Haws MD Electronically signed by Charlton Haws MD Signature Date/Time: 07/30/2023/5:46:59 PM    Final     Discharge Medications   Allergies as of 08/01/2023       Reactions   Penicillins Nausea And Vomiting   Tolerates Cephalosporin    Vicodin [hydrocodone-acetaminophen] Nausea Only        Medication List     STOP taking these medications    atenolol 25 MG tablet Commonly known as: Tenormin   furosemide 20 MG tablet Commonly known as: Lasix   potassium chloride 10 MEQ tablet Commonly known as: KLOR-CON M       TAKE these medications    acetaminophen 500 MG tablet Commonly known as: TYLENOL Take 500 mg by mouth every 6 (six) hours as needed for mild pain (pain score 1-3).   aspirin EC 81 MG tablet Take 1 tablet (81 mg total) by mouth daily. Swallow whole. Start taking on: August 02, 2023   cyanocobalamin 1000 MCG tablet Take 1 tablet (1,000 mcg total) by mouth daily.   digoxin 0.125 MG tablet Commonly known as: LANOXIN Take 1 tablet (0.125 mg total) by mouth daily. Start taking on:  August 02, 2023   empagliflozin 10 MG Tabs tablet Commonly known as: JARDIANCE Take 1 tablet (10 mg total) by mouth daily. Start taking on: August 02, 2023   folic acid 1 MG tablet Commonly known as: FOLVITE Take 1 tablet (1 mg total) by mouth daily.   losartan 25 MG tablet Commonly known as: COZAAR Take 0.5 tablets (12.5 mg total) by mouth daily. Start taking on: August 02, 2023   multivitamin with minerals Tabs tablet Take 1 tablet by mouth daily. Start taking on: August 02, 2023   rosuvastatin 20 MG tablet Commonly known as: CRESTOR Take 1 tablet (20 mg total) by mouth daily. Start taking on: August 02, 2023   spironolactone 25 MG tablet Commonly known as: ALDACTONE Take 0.5 tablets (12.5 mg total) by mouth daily. Start taking on: August 02, 2023   thiamine  100 MG tablet Commonly known as: VITAMIN B1 Take 1 tablet (100 mg total) by mouth daily. Start taking on: August 02, 2023   traZODone 50 MG tablet Commonly known as: DESYREL Take 1 tablet (50 mg total) by mouth at bedtime.               Durable Medical Equipment  (From admission, onward)           Start     Ordered   08/01/23 1000  Heart failure home health orders  (Heart failure home health orders / Face to face)  Once       Comments: Heart Failure Follow-up Care:  Verify follow-up appointments per Patient Discharge Instructions. Confirm transportation arranged. Reconcile home medications with discharge medication list. Remove discontinued medications from use. Assist patient/caregiver to manage medications using pill box. Reinforce low sodium food selection Assessments: Vital signs and oxygen saturation at each visit. Assess home environment for safety concerns, caregiver support and availability of low-sodium foods. Consult Child psychotherapist, PT/OT, Dietitian, and CNA based on assessments. Perform comprehensive cardiopulmonary assessment. Notify MD for any change in condition or  weight gain of 3 pounds in one day or 5 pounds in one week with symptoms. Daily Weights and Symptom Monitoring: Ensure patient has access to scales. Teach patient/caregiver to weigh daily before breakfast and after voiding using same scale and record.    Teach patient/caregiver to track weight and symptoms and when to notify Provider. Activity: Develop individualized activity plan with patient/caregiver.   Question Answer Comment  Heart Failure Follow-up Care Advanced Heart Failure (AHF) Clinic at [phone]   Lab frequency Other see comments   Fax lab results to AHF Clinic at [phone]   Diet Low Sodium Heart Healthy   Fluid restrictions: 1800 mL Fluid      08/01/23 1001   08/01/23 0955  For home use only DME Walker rolling  Once       Question Answer Comment  Walker: With 5 Inch Wheels   Patient needs a walker to treat with the following condition Heart failure (HCC)   Patient needs a walker to treat with the following condition Physical deconditioning      08/01/23 0955   08/01/23 0939  For home use only DME 4 wheeled rolling walker with seat  Once       Question Answer Comment  Patient needs a walker to treat with the following condition Physical deconditioning   Patient needs a walker to treat with the following condition Heart failure (HCC)      08/01/23 0347   08/01/23 0939  For home use only DME 3 n 1  Once        08/01/23 4259            Disposition   The patient will be discharged in stable condition to home. Discharge Instructions     Amb Referral to Cardiac Rehabilitation   Complete by: As directed    Diagnosis: Valve Replacement   Valve: Aortic Comment - tavr   After initial evaluation and assessments completed: Virtual Based Care may be provided alone or in conjunction with Phase 2 Cardiac Rehab based on patient barriers.: Yes   Intensive Cardiac Rehabilitation (ICR) [facility] location only OR Traditional Cardiac Rehabilitation (TCR) *If criteria for ICR  are not met will enroll in TCR [facility] only): Yes       Follow-up Information     [facility] Follow up.   Specialty: Home Health Services Why:  Home Health RN, Physical Therapy-agency will call to arrange visits Contact information: [address] [place] [address] [phone]         [facility] Follow up.   Why: Monday, August 22, 2023 at 3:20 pm  arrive at 3:05 am, bring ID, insurance card, and hospital discharge paperwork Contact information: [address] [place] [address] [phone]        [facility] Follow up on 08/20/2023.   Specialty: Cardiology Why: Follow up in the advanced heart failure clinic 3/18 @ [address], free valet Please bring all medications with you to appointment Contact information: [address] [place] [address] [phone]                  Duration of Discharge Encounter: 25 Time  Signed, Alen Bleacher AGACNP-BC  08/01/2023, 11:18 AM

## 2023-08-01 NOTE — TOC Transition Note (Addendum)
 Transition of Care [facility]) - Discharge Note   Patient Details  Name: [name] Daniel MRN: [id number] Date of Birth: Apr 16, 1956  Transition of Care [facility] CM/SW Contact:  Elliot Cousin, RN Phone Number: [phone] 08/01/2023, 11:21 AM   Clinical Narrative:    HF TOC CM spoke to pt and offered HH (medicare.gov list with ratings placed on chart and provided to pt) Pt agreeable to [facility] with [facility], contacted rep, Kandee Keen with new referral. Pt has RW and bedside commode at home. Scale and pill box was provided to patient to use at home. Provided pt with Living Better with HF booklet and reviewed low sodium and heart healthy diet. Sister will provide transportation home. HF funds will cover medications.   Provided pt with number to contact Medicare. And address for DSS [place] to apply for Medicaid.    Final next level of care: Home w Home Health Services Barriers to Discharge: No Barriers Identified   Patient Goals and CMS Choice Patient states their goals for this hospitalization and ongoing recovery are:: wants to get better CMS Medicare.gov Compare Post Acute Care list provided to:: Patient Choice offered to / list presented to : Patient Annex ownership interest in [facility].provided to::  (n/a)    Discharge Placement                       Discharge Plan and Services Additional resources added to the After Visit Summary for   In-house Referral: Clinical Social Work Discharge Planning Services: CM Consult Post Acute Care Choice: Home Health                               Social Drivers of Health (SDOH) Interventions SDOH Screenings   Food Insecurity: No Food Insecurity (07/18/2023)  Housing: Low Risk  (07/18/2023)  Transportation Needs: No Transportation Needs (07/18/2023)  Utilities: Not At Risk (07/18/2023)  Social Connections: Socially Isolated (07/18/2023)  Tobacco Use: Medium Risk (07/30/2023)     Readmission Risk Interventions     05/09/2023   12:49 PM  Readmission Risk Prevention Plan  Post Dischage Appt Not Complete  Medication Screening Complete  Transportation Screening Complete

## 2023-08-02 ENCOUNTER — Telehealth: Payer: Self-pay

## 2023-08-02 NOTE — Telephone Encounter (Signed)
 Attempted TOC Call but no answer and no option to leave a voicemail.

## 2023-08-05 LAB — POCT I-STAT, CHEM 8
BUN: 29 mg/dL — ABNORMAL HIGH (ref 8–23)
BUN: 29 mg/dL — ABNORMAL HIGH (ref 8–23)
Calcium, Ion: 1.17 mmol/L (ref 1.15–1.40)
Calcium, Ion: 1.12 mmol/L — ABNORMAL LOW (ref 1.15–1.40)
Chloride: 94 mmol/L — ABNORMAL LOW (ref 98–111)
Chloride: 94 mmol/L — ABNORMAL LOW (ref 98–111)
Creatinine, Ser: 1 mg/dL (ref 0.61–1.24)
Creatinine, Ser: 1 mg/dL (ref 0.61–1.24)
Glucose, Bld: 120 mg/dL — ABNORMAL HIGH (ref 70–99)
Glucose, Bld: 128 mg/dL — ABNORMAL HIGH (ref 70–99)
HCT: 39 % (ref 39.0–52.0)
HCT: 36 % — ABNORMAL LOW (ref 39.0–52.0)
Hemoglobin: 13.3 g/dL (ref 13.0–17.0)
Hemoglobin: 12.2 g/dL — ABNORMAL LOW (ref 13.0–17.0)
Potassium: 4.1 mmol/L (ref 3.5–5.1)
Potassium: 3.9 mmol/L (ref 3.5–5.1)
Sodium: 135 mmol/L (ref 135–145)
Sodium: 136 mmol/L (ref 135–145)
TCO2: 31 mmol/L (ref 22–32)
TCO2: 31 mmol/L (ref 22–32)

## 2023-08-05 NOTE — Telephone Encounter (Signed)
 Patient contacted regarding discharge from [facility] on 08/01/2023.  Patient understands to follow up with provider HF team on 08/20/2023 at 9:30 AM at [facility]. The patient is also aware of 4/2 follow-up with the Structural Heart Valve team.  Patient understands discharge instructions? yes Patient understands medications and regiment? yes Patient understands to bring all medications to this visit? yes  The pt states that he is doing well and he does not have any questions or concerns at this time.

## 2023-08-07 ENCOUNTER — Telehealth (HOSPITAL_COMMUNITY): Payer: Self-pay

## 2023-08-07 NOTE — Telephone Encounter (Signed)
 Called patient regarding referral to cardiac rehab with TAVR. He states he is working on Education officer, environmental B. He expects to receive paperwork today in the mail to complete and send back. The program was explained to him and he said he would call us when he gets coverage if he is interested in participating in the program. Contact information provided.

## 2023-08-19 ENCOUNTER — Ambulatory Visit: Payer: Self-pay | Admitting: Physician Assistant

## 2023-08-19 ENCOUNTER — Telehealth (HOSPITAL_COMMUNITY): Payer: Self-pay

## 2023-08-19 NOTE — Telephone Encounter (Signed)
 Called to confirm/remind patient of their appointment at the [facility] on 08/20/23.   Patient reminded to bring all medications and/or complete list.  Confirmed patient has transportation. Gave directions, instructed to utilize valet parking.  Confirmed appointment prior to ending call.

## 2023-08-19 NOTE — Telephone Encounter (Signed)
 This application will be followed up in an alternate encounter

## 2023-08-20 ENCOUNTER — Other Ambulatory Visit (HOSPITAL_COMMUNITY): Payer: Self-pay

## 2023-08-20 ENCOUNTER — Ambulatory Visit (HOSPITAL_COMMUNITY)
Admit: 2023-08-20 | Discharge: 2023-08-20 | Disposition: A | Discharge status: Home / Self Care | Payer: Self-pay | Attending: Family Medicine | Admitting: Family Medicine

## 2023-08-20 ENCOUNTER — Telehealth (HOSPITAL_COMMUNITY): Payer: Self-pay | Admitting: Cardiology

## 2023-08-20 ENCOUNTER — Encounter (HOSPITAL_COMMUNITY): Payer: Self-pay

## 2023-08-20 VITALS — BP 108/74 | HR 81 | Wt 163.8 lb

## 2023-08-20 DIAGNOSIS — I714 Abdominal aortic aneurysm, without rupture, unspecified: Secondary | ICD-10-CM | POA: Diagnosis not present

## 2023-08-20 DIAGNOSIS — F1010 Alcohol abuse, uncomplicated: Secondary | ICD-10-CM

## 2023-08-20 DIAGNOSIS — I3139 Other pericardial effusion (noninflammatory): Secondary | ICD-10-CM | POA: Diagnosis not present

## 2023-08-20 DIAGNOSIS — I350 Nonrheumatic aortic (valve) stenosis: Secondary | ICD-10-CM | POA: Diagnosis not present

## 2023-08-20 DIAGNOSIS — I2510 Atherosclerotic heart disease of native coronary artery without angina pectoris: Secondary | ICD-10-CM | POA: Insufficient documentation

## 2023-08-20 DIAGNOSIS — Z7984 Long term (current) use of oral hypoglycemic drugs: Secondary | ICD-10-CM | POA: Insufficient documentation

## 2023-08-20 DIAGNOSIS — I7121 Aneurysm of the ascending aorta, without rupture: Secondary | ICD-10-CM | POA: Insufficient documentation

## 2023-08-20 DIAGNOSIS — I509 Heart failure, unspecified: Secondary | ICD-10-CM

## 2023-08-20 DIAGNOSIS — Z79899 Other long term (current) drug therapy: Secondary | ICD-10-CM | POA: Diagnosis not present

## 2023-08-20 DIAGNOSIS — I5022 Chronic systolic (congestive) heart failure: Secondary | ICD-10-CM | POA: Insufficient documentation

## 2023-08-20 DIAGNOSIS — Z87891 Personal history of nicotine dependence: Secondary | ICD-10-CM | POA: Insufficient documentation

## 2023-08-20 DIAGNOSIS — Z139 Encounter for screening, unspecified: Secondary | ICD-10-CM

## 2023-08-20 DIAGNOSIS — Z952 Presence of prosthetic heart valve: Secondary | ICD-10-CM | POA: Insufficient documentation

## 2023-08-20 LAB — BASIC METABOLIC PANEL
Anion gap: 6 (ref 5–15)
BUN: 15 mg/dL (ref 8–23)
CO2: 27 mmol/L (ref 22–32)
Calcium: 9.3 mg/dL (ref 8.9–10.3)
Chloride: 105 mmol/L (ref 98–111)
Creatinine, Ser: 1.05 mg/dL (ref 0.61–1.24)
GFR, Estimated: >60 mL/min (ref >60)
Glucose, Bld: 112 mg/dL — ABNORMAL HIGH (ref 70–99)
Potassium: 4.6 mmol/L (ref 3.5–5.1)
Sodium: 138 mmol/L (ref 135–145)

## 2023-08-20 LAB — BRAIN NATRIURETIC PEPTIDE: B Natriuretic Peptide: 590.3 pg/mL — ABNORMAL HIGH (ref 0.0–100.0)

## 2023-08-20 LAB — DIGOXIN LEVEL: Digoxin Level: 0.7 ng/mL — ABNORMAL LOW (ref 0.8–2.0)

## 2023-08-20 MED ORDER — SPIRONOLACTONE 25 MG PO TABS
25.0000 mg | ORAL_TABLET | Freq: Every day | ORAL | 3 refills | Status: DC
  Filled 2023-08-20 (×2): qty 30, 30d supply, fill #0
  Filled 2023-09-26: qty 30, 30d supply, fill #1
  Filled 2023-10-22: qty 30, 30d supply, fill #2
  Filled 2023-11-14: qty 30, 30d supply, fill #3

## 2023-08-20 MED ORDER — DIGOXIN 125 MCG PO TABS
0.1250 mg | ORAL_TABLET | Freq: Every day | ORAL | 3 refills | Status: DC
  Filled 2023-08-20 (×3): qty 30, 30d supply, fill #0
  Filled 2023-09-26: qty 30, 30d supply, fill #1
  Filled 2023-10-22 (×2): qty 30, 30d supply, fill #2
  Filled 2023-11-14: qty 30, 30d supply, fill #3

## 2023-08-20 MED ORDER — SPIRONOLACTONE 25 MG PO TABS: 25.0000 mg | ORAL_TABLET | Freq: Every day | ORAL | 3 refills | Status: DC

## 2023-08-20 MED ORDER — LOSARTAN POTASSIUM 25 MG PO TABS: 25.0000 mg | ORAL_TABLET | Freq: Every day | ORAL | 3 refills | Status: DC

## 2023-08-20 MED ORDER — DIGOXIN 125 MCG PO TABS: 0.1250 mg | ORAL_TABLET | Freq: Every day | ORAL | 5 refills | Status: DC

## 2023-08-20 MED ORDER — ROSUVASTATIN CALCIUM 20 MG PO TABS
20.0000 mg | ORAL_TABLET | Freq: Every day | ORAL | 3 refills | Status: DC
  Filled 2023-08-20 (×2): qty 30, 30d supply, fill #0
  Filled 2023-09-26: qty 30, 30d supply, fill #1
  Filled 2023-10-22: qty 30, 30d supply, fill #2
  Filled 2023-11-14: qty 30, 30d supply, fill #3

## 2023-08-20 MED ORDER — LOSARTAN POTASSIUM 25 MG PO TABS
25.0000 mg | ORAL_TABLET | Freq: Every day | ORAL | 3 refills | Status: DC
  Filled 2023-08-20 (×2): qty 30, 30d supply, fill #0
  Filled 2023-09-26: qty 30, 30d supply, fill #1
  Filled 2023-10-22: qty 30, 30d supply, fill #2
  Filled 2023-11-14: qty 30, 30d supply, fill #3

## 2023-08-20 MED ORDER — ROSUVASTATIN CALCIUM 20 MG PO TABS: 20.0000 mg | ORAL_TABLET | Freq: Every day | ORAL | 3 refills | Status: DC

## 2023-08-20 NOTE — Telephone Encounter (Signed)
 Patient called to report copay for one medication $156 and another $78, reports he cannot afford at this time  Pt does not have Part D medicare  HF fund explained to patient Meds sent to [facility]. Aware jardiance will potentially come from manufacturer, application pending and samples provided at  OV 3/18

## 2023-08-20 NOTE — Patient Instructions (Addendum)
 There has been no changes to your medications.  Labs done today, your results will be available in MyChart, we will contact you for abnormal readings.  Your physician recommends that you schedule a follow-up appointment in: 3 weeks with the nurse practitioner   Your physician recommends that you schedule a follow-up appointment in: 3 months with Dr. Shirlee Latch.  If you have any questions or concerns before your next appointment please send [place] a message through [place] or call our office at [phone].    TO LEAVE A MESSAGE FOR THE NURSE SELECT OPTION 2, PLEASE LEAVE A MESSAGE INCLUDING: YOUR NAME DATE OF BIRTH CALL BACK NUMBER REASON FOR CALL**this is important as we prioritize the call backs  YOU WILL RECEIVE A CALL BACK THE SAME DAY AS LONG AS YOU CALL BEFORE 4:00 PM  At the [facility], you and your health needs are our priority. As part of our continuing mission to provide you with exceptional heart care, we have created designated Provider Care Teams. These Care Teams include your primary Cardiologist (physician) and Advanced Practice Providers (APPs- Physician Assistants and Nurse Practitioners) who all work together to provide you with the care you need, when you need it.   You may see any of the following providers on your designated Care Team at your next follow up: Dr Arvilla Meres Dr Marca Ancona Dr. Dorthula Nettles Dr. Clearnce Hasten Amy Filbert Schilder, NP Robbie Lis, [place] [facility] [place], [place] Brynda Peon, NP [place] Lee, NP Clarisa Kindred, NP Karle Plumber, PharmD Enos Fling, PharmD   Please be sure to bring in all your medications bottles to every appointment.    Thank you for choosing [facility]

## 2023-08-20 NOTE — Progress Notes (Signed)
 ReDS Vest / Clip - 08/20/23 0944       ReDS Vest / Clip   Station Marker C    Ruler Value 27    ReDS Value Range Low volume    ReDS Actual Value 30

## 2023-08-20 NOTE — Progress Notes (Signed)
 Medication Samples have been provided to the patient.  Drug name: Jardiance       Strength: 10        Qty: 28  LOT: [id number]  Exp.Date: 12/26  Dosing instructions: Patient takes 1 tablet by mouth daily.  The patient has been instructed regarding the correct time, dose, and frequency of taking this medication, including desired effects and most common side effects.   [name] Daniel 11:11 AM 08/20/2023
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
## 2023-08-22 ENCOUNTER — Encounter: Payer: Self-pay | Admitting: Physician Assistant

## 2023-08-22 ENCOUNTER — Ambulatory Visit (INDEPENDENT_AMBULATORY_CARE_PROVIDER_SITE_OTHER): Payer: Self-pay | Admitting: Physician Assistant

## 2023-08-22 VITALS — BP 132/87 | Ht 72.0 in | Wt 157.8 lb

## 2023-08-22 DIAGNOSIS — E1169 Type 2 diabetes mellitus with other specified complication: Secondary | ICD-10-CM | POA: Diagnosis not present

## 2023-08-22 DIAGNOSIS — D539 Nutritional anemia, unspecified: Secondary | ICD-10-CM | POA: Diagnosis not present

## 2023-08-22 DIAGNOSIS — E119 Type 2 diabetes mellitus without complications: Secondary | ICD-10-CM

## 2023-08-22 DIAGNOSIS — I5021 Acute systolic (congestive) heart failure: Secondary | ICD-10-CM | POA: Diagnosis not present

## 2023-08-22 DIAGNOSIS — R7989 Other specified abnormal findings of blood chemistry: Secondary | ICD-10-CM | POA: Insufficient documentation

## 2023-08-22 NOTE — Assessment & Plan Note (Signed)
 Patient stable today.  He reports no concerns of anemia symptoms such as dyspnea on exertion, syncope, dizziness.  Will repeat CBC and B12 and folate labs today.  Patient advised to decrease alcohol intake.

## 2023-08-22 NOTE — Assessment & Plan Note (Signed)
 Patient stable today.  No signs of acute volume overload.  Recent notes and lab work from cardiology reviewed today.  Patient encouraged to continue close follow-up with cardiology and structural heart team.  Continue current treatment plan.

## 2023-08-22 NOTE — Assessment & Plan Note (Signed)
 Stable.  Last A1c was 7 in February.  Patient currently on Jardiance.  Reports no symptomatology today.  Continue current treatment.  Will follow-up in approximately 3 months with repeat A1c.
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
## 2023-08-23 NOTE — Addendum Note (Signed)
 Addended by: Olga Millers on: 08/23/2023 09:01 AM   Modules accepted: Orders

## 2023-08-29 ENCOUNTER — Other Ambulatory Visit: Payer: Self-pay | Admitting: Physician Assistant

## 2023-08-29 ENCOUNTER — Other Ambulatory Visit (HOSPITAL_COMMUNITY): Payer: Self-pay

## 2023-08-29 ENCOUNTER — Telehealth: Payer: Self-pay

## 2023-08-29 ENCOUNTER — Other Ambulatory Visit: Payer: Self-pay

## 2023-08-29 NOTE — Telephone Encounter (Signed)
 Copied from CRM [id number]. Topic: Clinical - Medication Refill >> Aug 29, 2023  3:05 PM Izetta Dakin wrote: Most Recent Primary Care Visit:  Provider: GROOMS, COURTNEY  Department: [facility]  Visit Type: NEW PT - OFFICE VISIT  Date: 08/22/2023  Medication: traZODone (DESYREL) 50 MG tablet  Has the patient contacted their pharmacy? Yes (Agent: If no, request that the patient contact the pharmacy for the refill. If patient does not wish to contact the pharmacy document the reason why and proceed with request.) (Agent: If yes, when and what did the pharmacy advise?)  Is this the correct pharmacy for this prescription? Yes If no, delete pharmacy and type the correct one.  This is the patient's preferred pharmacy:    [place] [facility] [address] [place] [address] Phone: [phone] Fax: [phone]   Has the prescription been filled recently? No  Is the patient out of the medication? Yes  Has the patient been seen for an appointment in the last year OR does the patient have an upcoming appointment? Yes  Can we respond through MyChart? No  Agent: Please be advised that Rx refills may take up to 3 business days. We ask that you follow-up with your pharmacy.

## 2023-08-29 NOTE — Telephone Encounter (Signed)
 Copied from CRM [id number]. Topic: Clinical - Medication Refill >> Aug 29, 2023  3:05 PM Izetta Dakin wrote: Most Recent Primary Care Visit:  Provider: GROOMS, COURTNEY  Department: [facility]  Visit Type: NEW PT - OFFICE VISIT  Date: 08/22/2023  Medication: traZODone (DESYREL) 50 MG tablet  Has the patient contacted their pharmacy? Yes (Agent: If no, request that the patient contact the pharmacy for the refill. If patient does not wish to contact the pharmacy document the reason why and proceed with request.) (Agent: If yes, when and what did the pharmacy advise?)  Is this the correct pharmacy for this prescription?  If no, delete pharmacy and type the correct one.  This is the patient's preferred pharmacy:    [place] [facility] [address] [place] [address] Phone: [phone] Fax: [phone]   Has the prescription been filled recently? No  Is the patient out of the medication? Yes  Has the patient been seen for an appointment in the last year OR does the patient have an upcoming appointment? Yes  Can we respond through MyChart? No  Agent: Please be advised that Rx refills may take up to 3 business days. We ask that you follow-up with your pharmacy.

## 2023-08-29 NOTE — Progress Notes (Signed)
 Care Guide Pharmacy Note  08/29/2023 Name: WOODY KRONBERG MRN: [id number] DOB: [birth year]  Referred By: Olga Millers, PA-C Reason for referral: Care Coordination (Outreach to schedule with pharm d )   PACE LAMADRID is a 68 y.o. year old male who is a primary care patient of Grooms, Toni Amend, [place].  Aashrith Eves Broaddus was referred to the pharmacist for assistance related to: DMII  Successful contact was made with the patient to discuss pharmacy services including being ready for the pharmacist to call at least 5 minutes before the scheduled appointment time and to have medication bottles and any blood pressure readings ready for review. The patient agreed to meet with the pharmacist via telephone visit on (date/time).09/18/2023  Penne Lash , RMA     [place]  [facility], [facility] Guide  Direct Dial: [phone]  Website: [email or web address]

## 2023-08-30 MED ORDER — TRAZODONE HCL 50 MG PO TABS: 50.0000 mg | ORAL_TABLET | Freq: Every day | ORAL | 0 refills | Status: DC

## 2023-09-02 ENCOUNTER — Other Ambulatory Visit (HOSPITAL_COMMUNITY): Payer: Self-pay

## 2023-09-03 ENCOUNTER — Telehealth (HOSPITAL_COMMUNITY): Payer: Self-pay

## 2023-09-03 NOTE — Telephone Encounter (Signed)
 Advanced Heart Failure Patient Advocate Encounter  Application for Jardiance faxed to [facility] on 09/03/2023. Application form attached to patient chart.  Burnell Blanks, CPhT Rx Patient Advocate Phone: [phone]

## 2023-09-04 ENCOUNTER — Ambulatory Visit (HOSPITAL_COMMUNITY): Payer: Self-pay | Attending: Cardiology

## 2023-09-04 ENCOUNTER — Other Ambulatory Visit (HOSPITAL_COMMUNITY): Payer: Self-pay

## 2023-09-04 ENCOUNTER — Ambulatory Visit (INDEPENDENT_AMBULATORY_CARE_PROVIDER_SITE_OTHER): Payer: Self-pay | Admitting: Physician Assistant

## 2023-09-04 VITALS — BP 124/84 | HR 93 | Ht 72.0 in | Wt 160.4 lb

## 2023-09-04 DIAGNOSIS — I361 Nonrheumatic tricuspid (valve) insufficiency: Secondary | ICD-10-CM | POA: Diagnosis not present

## 2023-09-04 DIAGNOSIS — Z952 Presence of prosthetic heart valve: Secondary | ICD-10-CM | POA: Diagnosis not present

## 2023-09-04 DIAGNOSIS — R911 Solitary pulmonary nodule: Secondary | ICD-10-CM | POA: Diagnosis not present

## 2023-09-04 DIAGNOSIS — I712 Thoracic aortic aneurysm, without rupture, unspecified: Secondary | ICD-10-CM

## 2023-09-04 DIAGNOSIS — I5022 Chronic systolic (congestive) heart failure: Secondary | ICD-10-CM | POA: Insufficient documentation

## 2023-09-04 DIAGNOSIS — F1011 Alcohol abuse, in remission: Secondary | ICD-10-CM | POA: Insufficient documentation

## 2023-09-04 LAB — ECHOCARDIOGRAM COMPLETE
AV Mean grad: 7 mmHg
AV Peak grad: 15.7 mmHg
Ao pk vel: 1.98 m/s
S' Lateral: 4.2 cm

## 2023-09-04 MED ORDER — CERTAVITE/ANTIOXIDANTS PO TABS
1.0000 | ORAL_TABLET | Freq: Every day | ORAL | 5 refills | Status: AC
  Filled 2023-09-04: qty 30, 30d supply, fill #0
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
## 2023-09-04 NOTE — Patient Instructions (Signed)
 Medication Instructions:  Your physician recommends that you continue on your current medications as directed. Please refer to the Current Medication list given to you today.  *If you need a refill on your cardiac medications before your next appointment, please call your pharmacy*  Lab Work: NONE If you have labs (blood work) drawn today and your tests are completely normal, you will receive your results only by: MyChart Message (if you have MyChart) OR A paper copy in the mail If you have any lab test that is abnormal or we need to change your treatment, we will call you to review the results.  Testing/Procedures: YOUR PROVIDER RECOMMENDS THAT YOU HAVE A CHEST CT WITHOUT CONTRAST   Follow-Up: At [facility], you and your health needs are our priority.  As part of our continuing mission to provide you with exceptional heart care, our providers are all part of one team.  This team includes your primary Cardiologist (physician) and Advanced Practice Providers or APPs (Physician Assistants and Nurse Practitioners) who all work together to provide you with the care you need, when you need it.  Your next appointment:   KEEP SCHEDULED FOLLOW-UP  We recommend signing up for the patient portal called "MyChart".  Sign up information is provided on this After Visit Summary.  MyChart is used to connect with patients for Virtual Visits (Telemedicine).  Patients are able to view lab/test results, encounter notes, upcoming appointments, etc.  Non-urgent messages can be sent to your provider as well.   To learn more about what you can do with MyChart, go to [email or web address].   Other Instructions       1st Floor: - Lobby - Registration  - Pharmacy  - Lab - Cafe  2nd Floor: - PV Lab - Diagnostic Testing (echo, CT, nuclear med)  3rd Floor: - Vacant  4th Floor: - TCTS (cardiothoracic surgery) - AFib Clinic - Structural Heart Clinic - Vascular Surgery  - Vascular  Ultrasound  5th Floor: - HeartCare Cardiology (general and EP) - Clinical Pharmacy for coumadin, hypertension, lipid, weight-loss medications, and med management appointments    Valet parking services will be available as well.

## 2023-09-09 ENCOUNTER — Ambulatory Visit (HOSPITAL_COMMUNITY)
Admission: RE | Admit: 2023-09-09 | Discharge: 2023-09-09 | Disposition: A | Discharge status: Home / Self Care | Source: Ambulatory Visit | Attending: Physician Assistant | Admitting: Physician Assistant

## 2023-09-09 DIAGNOSIS — R911 Solitary pulmonary nodule: Secondary | ICD-10-CM | POA: Diagnosis present

## 2023-09-09 NOTE — Telephone Encounter (Signed)
 Patient was approved to receive Jardiance from [facility] Effective 09/07/2023 to 09/05/2024  Patient should receive first delivery within the next few days, and will contact [facility] for refills. Informed patient by phone.

## 2023-09-10 ENCOUNTER — Telehealth (HOSPITAL_COMMUNITY): Payer: Self-pay

## 2023-09-10 NOTE — Telephone Encounter (Signed)
 Called to confirm/remind patient of their appointment at the Advanced Heart Failure Clinic on 09/11/23.   Appointment:   [] Confirmed  [] Left mess   [x] No answer/No voice mail  [] Phone not in service
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
## 2023-09-11 ENCOUNTER — Ambulatory Visit (HOSPITAL_COMMUNITY)
Admission: RE | Admit: 2023-09-11 | Discharge: 2023-09-11 | Disposition: A | Discharge status: Home / Self Care | Payer: Self-pay | Source: Ambulatory Visit | Attending: Family Medicine | Admitting: Family Medicine

## 2023-09-11 ENCOUNTER — Other Ambulatory Visit (HOSPITAL_COMMUNITY): Payer: Self-pay

## 2023-09-11 ENCOUNTER — Encounter (HOSPITAL_COMMUNITY): Payer: Self-pay

## 2023-09-11 VITALS — BP 120/80 | HR 99 | Ht 73.0 in | Wt 160.0 lb

## 2023-09-11 DIAGNOSIS — I712 Thoracic aortic aneurysm, without rupture, unspecified: Secondary | ICD-10-CM

## 2023-09-11 DIAGNOSIS — I7121 Aneurysm of the ascending aorta, without rupture: Secondary | ICD-10-CM | POA: Diagnosis not present

## 2023-09-11 DIAGNOSIS — Z87891 Personal history of nicotine dependence: Secondary | ICD-10-CM | POA: Insufficient documentation

## 2023-09-11 DIAGNOSIS — I350 Nonrheumatic aortic (valve) stenosis: Secondary | ICD-10-CM | POA: Diagnosis present

## 2023-09-11 DIAGNOSIS — I2510 Atherosclerotic heart disease of native coronary artery without angina pectoris: Secondary | ICD-10-CM | POA: Insufficient documentation

## 2023-09-11 DIAGNOSIS — M79604 Pain in right leg: Secondary | ICD-10-CM

## 2023-09-11 DIAGNOSIS — Z79899 Other long term (current) drug therapy: Secondary | ICD-10-CM | POA: Diagnosis not present

## 2023-09-11 DIAGNOSIS — Z952 Presence of prosthetic heart valve: Secondary | ICD-10-CM

## 2023-09-11 DIAGNOSIS — M79606 Pain in leg, unspecified: Secondary | ICD-10-CM | POA: Insufficient documentation

## 2023-09-11 DIAGNOSIS — I5022 Chronic systolic (congestive) heart failure: Secondary | ICD-10-CM | POA: Diagnosis not present

## 2023-09-11 DIAGNOSIS — M79605 Pain in left leg: Secondary | ICD-10-CM

## 2023-09-11 DIAGNOSIS — I739 Peripheral vascular disease, unspecified: Secondary | ICD-10-CM | POA: Diagnosis not present

## 2023-09-11 DIAGNOSIS — I5023 Acute on chronic systolic (congestive) heart failure: Secondary | ICD-10-CM | POA: Diagnosis present

## 2023-09-11 DIAGNOSIS — F1011 Alcohol abuse, in remission: Secondary | ICD-10-CM

## 2023-09-11 DIAGNOSIS — Z7984 Long term (current) use of oral hypoglycemic drugs: Secondary | ICD-10-CM | POA: Diagnosis not present

## 2023-09-11 LAB — BASIC METABOLIC PANEL WITH GFR
Anion gap: 10 (ref 5–15)
BUN: 20 mg/dL (ref 8–23)
CO2: 26 mmol/L (ref 22–32)
Calcium: 9.1 mg/dL (ref 8.9–10.3)
Chloride: 101 mmol/L (ref 98–111)
Creatinine, Ser: 0.99 mg/dL (ref 0.61–1.24)
GFR, Estimated: >60 mL/min (ref >60)
Glucose, Bld: 100 mg/dL — ABNORMAL HIGH (ref 70–99)
Potassium: 4.2 mmol/L (ref 3.5–5.1)
Sodium: 137 mmol/L (ref 135–145)

## 2023-09-11 LAB — BRAIN NATRIURETIC PEPTIDE: B Natriuretic Peptide: 138.2 pg/mL — ABNORMAL HIGH (ref 0.0–100.0)

## 2023-09-11 MED ORDER — METOPROLOL SUCCINATE ER 25 MG PO TB24
25.0000 mg | ORAL_TABLET | Freq: Every day | ORAL | 11 refills | Status: AC
  Filled 2023-09-11: qty 30, 30d supply, fill #0
  Filled 2023-10-07: qty 30, 30d supply, fill #1
  Filled 2023-10-31: qty 30, 30d supply, fill #2
  Filled 2023-12-11: qty 30, 30d supply, fill #3
  Filled 2024-01-08: qty 30, 30d supply, fill #4
  Filled 2024-02-04: qty 30, 30d supply, fill #5
  Filled 2024-03-04: qty 30, 30d supply, fill #6
  Filled 2024-04-03: qty 30, 30d supply, fill #7
  Filled 2024-05-07: qty 30, 30d supply, fill #8
  Filled 2024-06-09 (×2): qty 30, 30d supply, fill #9
  Filled 2024-07-07: qty 30, 30d supply, fill #10

## 2023-09-11 NOTE — Progress Notes (Signed)
 ReDS Vest / Clip - 09/11/23 1520       ReDS Vest / Clip   Station Marker C    Ruler Value 25    ReDS Value Range Low volume    ReDS Actual Value 29

## 2023-09-11 NOTE — Patient Instructions (Addendum)
 Start Toprol XL 25 mg daily - Rx sent. Labs today - will call you if abnormal. Lower extremity arterial duplex ordered to evaluate leg pain. We will obtain prior authorization and call you to schedule. Return to see Dr. Shirlee Latch with echo in 3 months. CALL us AT [phone] IN JUNE TO SCHEDULE THIS APPOINTMENT. Please call us at [phone] if any questions or concerns prior to your next visit.    **You may call [facility] [phone] to check on your Jardiance shipment**

## 2023-09-12 NOTE — Addendum Note (Signed)
 Encounter addended by: Baird Cancer, RN on: 09/12/2023 11:41 AM  Actions taken: Order list changed, Diagnosis association updated

## 2023-09-17 ENCOUNTER — Other Ambulatory Visit: Payer: Self-pay

## 2023-09-17 ENCOUNTER — Telehealth: Payer: Self-pay

## 2023-09-17 NOTE — Progress Notes (Signed)
   09/17/2023  Patient ID: [name] Daniel, male   DOB: [birth year], 68 y.o.   MRN: [id number]  Care Coordination Call  Spoke w/ patient, declined pharmacy referral d/t already receiving assistance through cardiology pharmacy team w/ Jardiance. Understands this was already approved through [employer]. Denies any additional medication-related needs at this time.   Rolando Cliche, PharmD, BCGP Clinical Pharmacist  [phone]

## 2023-09-18 ENCOUNTER — Other Ambulatory Visit: Admitting: Pharmacist

## 2023-09-18 ENCOUNTER — Ambulatory Visit (HOSPITAL_BASED_OUTPATIENT_CLINIC_OR_DEPARTMENT_OTHER)
Admission: RE | Admit: 2023-09-18 | Discharge: 2023-09-18 | Disposition: A | Discharge status: Home / Self Care | Source: Ambulatory Visit | Attending: Family Medicine | Admitting: Family Medicine

## 2023-09-18 ENCOUNTER — Ambulatory Visit (HOSPITAL_COMMUNITY)
Admission: RE | Admit: 2023-09-18 | Discharge: 2023-09-18 | Disposition: A | Discharge status: Home / Self Care | Source: Ambulatory Visit | Attending: Family Medicine | Admitting: Family Medicine

## 2023-09-18 DIAGNOSIS — M79605 Pain in left leg: Secondary | ICD-10-CM | POA: Diagnosis not present

## 2023-09-18 DIAGNOSIS — I739 Peripheral vascular disease, unspecified: Secondary | ICD-10-CM | POA: Insufficient documentation

## 2023-09-18 DIAGNOSIS — M79604 Pain in right leg: Secondary | ICD-10-CM | POA: Insufficient documentation

## 2023-09-18 DIAGNOSIS — I5022 Chronic systolic (congestive) heart failure: Secondary | ICD-10-CM | POA: Diagnosis present

## 2023-09-18 LAB — VAS US ABI WITH/WO TBI
Left ABI: 1.24
Right ABI: 1.22

## 2023-09-26 ENCOUNTER — Other Ambulatory Visit (HOSPITAL_COMMUNITY): Payer: Self-pay

## 2023-09-26 ENCOUNTER — Other Ambulatory Visit: Payer: Self-pay

## 2023-09-27 ENCOUNTER — Other Ambulatory Visit (HOSPITAL_COMMUNITY): Payer: Self-pay

## 2023-09-27 ENCOUNTER — Ambulatory Visit: Payer: Self-pay | Admitting: Internal Medicine

## 2023-10-03 ENCOUNTER — Ambulatory Visit: Payer: Self-pay | Admitting: Physician Assistant

## 2023-10-07 ENCOUNTER — Other Ambulatory Visit (HOSPITAL_COMMUNITY): Payer: Self-pay

## 2023-10-22 ENCOUNTER — Other Ambulatory Visit: Payer: Self-pay

## 2023-10-22 ENCOUNTER — Other Ambulatory Visit (HOSPITAL_COMMUNITY): Payer: Self-pay

## 2023-10-23 ENCOUNTER — Other Ambulatory Visit (HOSPITAL_COMMUNITY): Payer: Self-pay

## 2023-10-24 ENCOUNTER — Other Ambulatory Visit (HOSPITAL_COMMUNITY): Payer: Self-pay

## 2023-10-31 ENCOUNTER — Other Ambulatory Visit (HOSPITAL_COMMUNITY): Payer: Self-pay

## 2023-11-14 ENCOUNTER — Telehealth (HOSPITAL_COMMUNITY): Payer: Self-pay | Admitting: Pharmacy Technician

## 2023-11-14 ENCOUNTER — Encounter (HOSPITAL_COMMUNITY): Payer: Self-pay | Admitting: Cardiology

## 2023-11-14 ENCOUNTER — Ambulatory Visit (HOSPITAL_COMMUNITY)
Admission: RE | Admit: 2023-11-14 | Discharge: 2023-11-14 | Disposition: A | Discharge status: Home / Self Care | Payer: Self-pay | Source: Ambulatory Visit | Attending: Cardiology | Admitting: Cardiology

## 2023-11-14 ENCOUNTER — Other Ambulatory Visit (HOSPITAL_COMMUNITY): Payer: Self-pay

## 2023-11-14 VITALS — BP 106/74 | HR 99 | Ht 73.0 in | Wt 157.0 lb

## 2023-11-14 DIAGNOSIS — Z79899 Other long term (current) drug therapy: Secondary | ICD-10-CM | POA: Diagnosis not present

## 2023-11-14 DIAGNOSIS — R9431 Abnormal electrocardiogram [ECG] [EKG]: Secondary | ICD-10-CM | POA: Diagnosis not present

## 2023-11-14 DIAGNOSIS — I350 Nonrheumatic aortic (valve) stenosis: Secondary | ICD-10-CM | POA: Insufficient documentation

## 2023-11-14 DIAGNOSIS — I5022 Chronic systolic (congestive) heart failure: Secondary | ICD-10-CM

## 2023-11-14 DIAGNOSIS — I2510 Atherosclerotic heart disease of native coronary artery without angina pectoris: Secondary | ICD-10-CM | POA: Insufficient documentation

## 2023-11-14 DIAGNOSIS — I7121 Aneurysm of the ascending aorta, without rupture: Secondary | ICD-10-CM | POA: Diagnosis not present

## 2023-11-14 DIAGNOSIS — I428 Other cardiomyopathies: Secondary | ICD-10-CM | POA: Diagnosis not present

## 2023-11-14 DIAGNOSIS — Z953 Presence of xenogenic heart valve: Secondary | ICD-10-CM | POA: Insufficient documentation

## 2023-11-14 LAB — BASIC METABOLIC PANEL WITH GFR
Anion gap: 9 (ref 5–15)
BUN: 17 mg/dL (ref 8–23)
CO2: 27 mmol/L (ref 22–32)
Calcium: 9.3 mg/dL (ref 8.9–10.3)
Chloride: 99 mmol/L (ref 98–111)
Creatinine, Ser: 1.11 mg/dL (ref 0.61–1.24)
GFR, Estimated: >60 mL/min (ref >60)
Glucose, Bld: 127 mg/dL — ABNORMAL HIGH (ref 70–99)
Potassium: 4.2 mmol/L (ref 3.5–5.1)
Sodium: 135 mmol/L (ref 135–145)

## 2023-11-14 LAB — DIGOXIN LEVEL: Digoxin Level: 0.8 ng/mL (ref 0.8–2.0)

## 2023-11-14 LAB — BRAIN NATRIURETIC PEPTIDE: B Natriuretic Peptide: 108 pg/mL — ABNORMAL HIGH (ref 0.0–100.0)

## 2023-11-14 MED ORDER — ENTRESTO 24-26 MG PO TABS
1.0000 | ORAL_TABLET | Freq: Two times a day (BID) | ORAL | 3 refills | Status: DC
  Filled 2023-11-14: qty 60, 30d supply, fill #0
  Filled 2023-12-11: qty 60, 30d supply, fill #1
  Filled 2024-01-08: qty 60, 30d supply, fill #2

## 2023-11-14 NOTE — Patient Instructions (Signed)
 Medication Changes:  STOP Losartan   START Entresto 24/26 mg Twice daily   Lab Work:  Labs done today, we will call  you for abnormal results  Your physician recommends that you return for lab work in: 1-2 weeks, order has bee placed for this to be done at [facility]  Testing/Procedures:  Your physician has requested that you have an echocardiogram. Echocardiography is a painless test that uses sound waves to create images of your heart. It provides your doctor with information about the size and shape of your heart and how well your heart's chambers and valves are working. This procedure takes approximately one hour. There are no restrictions for this procedure. Please do NOT wear cologne, perfume, aftershave, or lotions (deodorant is allowed). Please arrive 15 minutes prior to your appointment time. THIS WILL BE DONE AT [facility], THEY WILL CALL YOU TO SCHEDULE  Please note: We ask at that you not bring children with you during ultrasound (echo/ vascular) testing. Due to room size and safety concerns, children are not allowed in the ultrasound rooms during exams. Our front office staff cannot provide observation of children in our lobby area while testing is being conducted. An adult accompanying a patient to their appointment will only be allowed in the ultrasound room at the discretion of the ultrasound technician under special circumstances. We apologize for any inconvenience.  Special Instructions // Education:  Do the following things EVERYDAY: Weigh yourself in the morning before breakfast. Write it down and keep it in a log. Take your medicines as prescribed Eat low salt foods--Limit salt (sodium) to 2000 mg per day.  Stay as active as you can everyday Limit all fluids for the day to less than 2 liters   Follow-Up in: 2 months   At the [facility], you and your health needs are our priority. We have a designated team specialized in the treatment of  Heart Failure. This Care Team includes your primary Heart Failure Specialized Cardiologist (physician), Advanced Practice Providers (APPs- Physician Assistants and Nurse Practitioners), and Pharmacist who all work together to provide you with the care you need, when you need it.   You may see any of the following providers on your designated Care Team at your next follow up:  Dr. Jules Oar Dr. Peder Bourdon Dr. Alwin Baars Dr. Judyth Nunnery Nieves Bars, NP Ruddy Corral, [place] [facility] [place], [place] Dennise Fitz, NP [place] Lee, NP Luster Salters, PharmD   Please be sure to bring in all your medications bottles to every appointment.   Need to Contact Us :  If you have any questions or concerns before your next appointment please send us  a message through [place] or call our office at [phone].    TO LEAVE A MESSAGE FOR THE NURSE SELECT OPTION 2, PLEASE LEAVE A MESSAGE INCLUDING: YOUR NAME DATE OF BIRTH CALL BACK NUMBER REASON FOR CALL**this is important as we prioritize the call backs  YOU WILL RECEIVE A CALL BACK THE SAME DAY AS LONG AS YOU CALL BEFORE 4:00 PM

## 2023-11-14 NOTE — Telephone Encounter (Signed)
 Pharmacy Patient Advocate Encounter  Insurance verification completed.   The patient is insured through [employer]   Ran test claim for [employer]. Currently a quantity of 180 is a 90 day supply and the co-pay is $4.80.  This test claim was processed through [facility]- copay amounts may vary at other pharmacies due to pharmacy/plan contracts, or as the patient moves through the different stages of their insurance plan.   Correne Dillon, CPhT

## 2023-11-15 ENCOUNTER — Ambulatory Visit (HOSPITAL_COMMUNITY): Payer: Self-pay | Admitting: Cardiology

## 2023-11-15 ENCOUNTER — Other Ambulatory Visit (HOSPITAL_COMMUNITY): Payer: Self-pay
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
## 2023-11-18 ENCOUNTER — Other Ambulatory Visit (HOSPITAL_COMMUNITY): Payer: Self-pay

## 2023-12-11 ENCOUNTER — Other Ambulatory Visit (HOSPITAL_COMMUNITY): Payer: Self-pay

## 2023-12-12 ENCOUNTER — Other Ambulatory Visit (HOSPITAL_COMMUNITY): Payer: Self-pay

## 2023-12-13 ENCOUNTER — Ambulatory Visit (HOSPITAL_COMMUNITY)

## 2023-12-25 ENCOUNTER — Other Ambulatory Visit (HOSPITAL_COMMUNITY): Payer: Self-pay | Admitting: Cardiology

## 2023-12-25 ENCOUNTER — Other Ambulatory Visit (HOSPITAL_COMMUNITY): Payer: Self-pay

## 2023-12-26 ENCOUNTER — Telehealth (HOSPITAL_COMMUNITY): Payer: Self-pay

## 2023-12-26 ENCOUNTER — Other Ambulatory Visit (HOSPITAL_COMMUNITY): Payer: Self-pay

## 2023-12-26 MED ORDER — ROSUVASTATIN CALCIUM 20 MG PO TABS
20.0000 mg | ORAL_TABLET | Freq: Every day | ORAL | 3 refills | Status: DC
  Filled 2023-12-26: qty 30, 30d supply, fill #0
  Filled 2024-01-23: qty 30, 30d supply, fill #1
  Filled 2024-02-21: qty 30, 30d supply, fill #2
  Filled 2024-03-23: qty 30, 30d supply, fill #3

## 2023-12-26 MED ORDER — SPIRONOLACTONE 25 MG PO TABS
25.0000 mg | ORAL_TABLET | Freq: Every day | ORAL | 3 refills | Status: DC
  Filled 2023-12-26: qty 30, 30d supply, fill #0
  Filled 2024-01-23: qty 30, 30d supply, fill #1
  Filled 2024-02-21: qty 30, 30d supply, fill #2
  Filled 2024-03-23: qty 30, 30d supply, fill #3

## 2023-12-26 MED ORDER — DIGOXIN 125 MCG PO TABS
0.1250 mg | ORAL_TABLET | Freq: Every day | ORAL | 3 refills | Status: DC
  Filled 2023-12-26: qty 30, 30d supply, fill #0

## 2023-12-26 NOTE — Telephone Encounter (Signed)
 Received call from patient, he reports he thought he needed refills, but states that they have already been refilled.   Advised patient to call back to office with any issues, questions, or concerns. Patient verbalized understanding.

## 2023-12-27 ENCOUNTER — Ambulatory Visit (HOSPITAL_COMMUNITY)
Admission: RE | Admit: 2023-12-27 | Discharge: 2023-12-27 | Disposition: A | Discharge status: Home / Self Care | Source: Ambulatory Visit | Attending: Cardiology | Admitting: Cardiology

## 2023-12-27 DIAGNOSIS — I5022 Chronic systolic (congestive) heart failure: Secondary | ICD-10-CM | POA: Insufficient documentation

## 2023-12-27 LAB — ECHOCARDIOGRAM COMPLETE
AR max vel: 1.3 cm2
AV Area VTI: 1.43 cm2
AV Area mean vel: 1.41 cm2
AV Mean grad: 8 mmHg
AV Peak grad: 16.3 mmHg
Ao pk vel: 2.02 m/s
Area-P 1/2: 3.34 cm2
Calc EF: 52.1 %
MV VTI: 2 cm2
S' Lateral: 2.9 cm
Single Plane A2C EF: 45.9 %
Single Plane A4C EF: 60.3 %

## 2024-01-08 ENCOUNTER — Other Ambulatory Visit (HOSPITAL_COMMUNITY): Payer: Self-pay
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
## 2024-01-13 ENCOUNTER — Telehealth (HOSPITAL_COMMUNITY): Payer: Self-pay | Admitting: *Deleted

## 2024-01-13 NOTE — Telephone Encounter (Signed)
 Called to confirm/remind patient of their appointment at the [facility] on 01/14/24.        Appointment:              [x] Confirmed             [] Left mess              [] No answer/No voice mail             [] Phone not in service   Patient reminded to bring all medications and/or complete list.   Confirmed patient has transportation. Gave directions, instructed to utilize valet parking.

## 2024-01-14 ENCOUNTER — Ambulatory Visit (HOSPITAL_COMMUNITY)
Admission: RE | Admit: 2024-01-14 | Discharge: 2024-01-14 | Disposition: A | Discharge status: Home / Self Care | Source: Ambulatory Visit | Attending: Family Medicine | Admitting: Family Medicine

## 2024-01-14 ENCOUNTER — Other Ambulatory Visit (HOSPITAL_COMMUNITY): Payer: Self-pay

## 2024-01-14 ENCOUNTER — Encounter (HOSPITAL_COMMUNITY): Payer: Self-pay

## 2024-01-14 VITALS — BP 100/60 | HR 97 | Ht 73.0 in | Wt 155.6 lb

## 2024-01-14 DIAGNOSIS — Z87891 Personal history of nicotine dependence: Secondary | ICD-10-CM | POA: Insufficient documentation

## 2024-01-14 DIAGNOSIS — I2510 Atherosclerotic heart disease of native coronary artery without angina pectoris: Secondary | ICD-10-CM | POA: Insufficient documentation

## 2024-01-14 DIAGNOSIS — I428 Other cardiomyopathies: Secondary | ICD-10-CM | POA: Diagnosis not present

## 2024-01-14 DIAGNOSIS — Z79899 Other long term (current) drug therapy: Secondary | ICD-10-CM | POA: Diagnosis not present

## 2024-01-14 DIAGNOSIS — Z953 Presence of xenogenic heart valve: Secondary | ICD-10-CM | POA: Insufficient documentation

## 2024-01-14 DIAGNOSIS — I5022 Chronic systolic (congestive) heart failure: Secondary | ICD-10-CM

## 2024-01-14 DIAGNOSIS — Q2381 Bicuspid aortic valve: Secondary | ICD-10-CM | POA: Diagnosis not present

## 2024-01-14 DIAGNOSIS — Z952 Presence of prosthetic heart valve: Secondary | ICD-10-CM | POA: Diagnosis not present

## 2024-01-14 DIAGNOSIS — F1011 Alcohol abuse, in remission: Secondary | ICD-10-CM | POA: Diagnosis not present

## 2024-01-14 DIAGNOSIS — F1010 Alcohol abuse, uncomplicated: Secondary | ICD-10-CM | POA: Insufficient documentation

## 2024-01-14 DIAGNOSIS — I350 Nonrheumatic aortic (valve) stenosis: Secondary | ICD-10-CM | POA: Diagnosis not present

## 2024-01-14 DIAGNOSIS — I7121 Aneurysm of the ascending aorta, without rupture: Secondary | ICD-10-CM | POA: Insufficient documentation

## 2024-01-14 DIAGNOSIS — Z7984 Long term (current) use of oral hypoglycemic drugs: Secondary | ICD-10-CM | POA: Diagnosis not present

## 2024-01-14 LAB — BASIC METABOLIC PANEL WITH GFR
Anion gap: 8 (ref 5–15)
BUN: 16 mg/dL (ref 8–23)
CO2: 28 mmol/L (ref 22–32)
Calcium: 9.5 mg/dL (ref 8.9–10.3)
Chloride: 102 mmol/L (ref 98–111)
Creatinine, Ser: 1.1 mg/dL (ref 0.61–1.24)
GFR, Estimated: >60 mL/min (ref >60)
Glucose, Bld: 130 mg/dL — ABNORMAL HIGH (ref 70–99)
Potassium: 4 mmol/L (ref 3.5–5.1)
Sodium: 138 mmol/L (ref 135–145)

## 2024-01-14 MED ORDER — LOSARTAN POTASSIUM 25 MG PO TABS
12.5000 mg | ORAL_TABLET | Freq: Every day | ORAL | 3 refills | Status: AC
  Filled 2024-01-14: qty 45, 90d supply, fill #0
  Filled 2024-03-23 – 2024-03-30 (×2): qty 45, 90d supply, fill #1
  Filled 2024-07-07: qty 45, 90d supply, fill #2

## 2024-01-14 MED ORDER — BLOOD PRESSURE MONITOR MISC
0 refills | Status: AC
Start: 2024-01-14 — End: ?
  Filled 2024-01-14: qty 1, 30d supply, fill #0

## 2024-01-14 NOTE — Patient Instructions (Addendum)
 Thank you for coming in today  If you had labs drawn today, any labs that are abnormal the clinic will call you No news is good news  You were sent a prescription to your pharmacy for a blood pressure cuff.  We sent a message to our social worker regarding your concerns about medications from a upcoming change in your insurance.  Medications: Stop Entresto  Stop Digoxin  START Losartan  12.5 mg 1/2 tablet daily  Follow up appointments:  Your physician recommends that you schedule a follow-up appointment in:  4 months With Dr. Rolan  Please call our office to schedule the follow-up appointment in October for December 2025.   Do the following things EVERYDAY: Weigh yourself in the morning before breakfast. Write it down and keep it in a log. Take your medicines as prescribed Eat low salt foods--Limit salt (sodium) to 2000 mg per day.  Stay as active as you can everyday Limit all fluids for the day to less than 2 liters   At the [facility], you and your health needs are our priority. As part of our continuing mission to provide you with exceptional heart care, we have created designated Provider Care Teams. These Care Teams include your primary Cardiologist (physician) and Advanced Practice Providers (APPs- Physician Assistants and Nurse Practitioners) who all work together to provide you with the care you need, when you need it.   You may see any of the following providers on your designated Care Team at your next follow up: Dr Toribio Fuel Dr Ezra Rolan Dr. Ria Gardenia Greig Lenetta, NP Caffie Shed, [place] [facility] [place], [place] Beckey Coe, NP [facility], PharmD   Please be sure to bring in all your medications bottles to every appointment.    Thank you for choosing [facility]  If you have any questions or concerns before your next appointment please send us  a message through [place] or  call our office at [phone].    TO LEAVE A MESSAGE FOR THE NURSE SELECT OPTION 2, PLEASE LEAVE A MESSAGE INCLUDING: YOUR NAME DATE OF BIRTH CALL BACK NUMBER REASON FOR CALL**this is important as we prioritize the call backs  YOU WILL RECEIVE A CALL BACK THE SAME DAY AS LONG AS YOU CALL BEFORE 4:00 PM

## 2024-01-15 ENCOUNTER — Ambulatory Visit (HOSPITAL_COMMUNITY): Payer: Self-pay | Admitting: Family Medicine

## 2024-01-16 ENCOUNTER — Telehealth (HOSPITAL_COMMUNITY): Payer: Self-pay | Admitting: Licensed Clinical Social Worker

## 2024-01-16 ENCOUNTER — Other Ambulatory Visit (HOSPITAL_COMMUNITY): Payer: Self-pay

## 2024-01-16 NOTE — Telephone Encounter (Signed)
 H&V Care Navigation CSW Progress Note  Clinical Social Worker consulted to speak with pt about him losing Medicaid and having concerns with medication costs.  Pt confirms he received a large inheritance which made him over reserve limit for Medicaid so his benefits will stop 9/1 he believes.  States he has [employer]- was able to have pharmacy team confirm this.  Explained that [employer] would still cover his medications but that copays might be more- encouraged him to reach out if there were any issues having mediations covered or if he was unable to afford medications.   SDOH Screenings   Food Insecurity: No Food Insecurity (07/18/2023)  Housing: Low Risk  (07/18/2023)  Transportation Needs: No Transportation Needs (07/18/2023)  Utilities: Not At Risk (07/18/2023)  Depression (PHQ2-9): Medium Risk (08/22/2023)  Social Connections: Socially Isolated (07/18/2023)  Tobacco Use: Medium Risk (01/14/2024)   Andriette HILARIO Leech, LCSW Clinical Social Worker [facility] Desk#: [phone] Cell#: [phone]

## 2024-01-23 ENCOUNTER — Other Ambulatory Visit (HOSPITAL_COMMUNITY): Payer: Self-pay

## 2024-02-04 ENCOUNTER — Ambulatory Visit (INDEPENDENT_AMBULATORY_CARE_PROVIDER_SITE_OTHER): Payer: Self-pay | Admitting: Physician Assistant

## 2024-02-04 ENCOUNTER — Other Ambulatory Visit (HOSPITAL_COMMUNITY): Payer: Self-pay

## 2024-02-04 VITALS — BP 103/71 | HR 99 | Temp 97.5°F | Ht 73.0 in | Wt 157.0 lb

## 2024-02-04 DIAGNOSIS — Z7984 Long term (current) use of oral hypoglycemic drugs: Secondary | ICD-10-CM

## 2024-02-04 DIAGNOSIS — I10 Essential (primary) hypertension: Secondary | ICD-10-CM | POA: Diagnosis not present

## 2024-02-04 DIAGNOSIS — I5021 Acute systolic (congestive) heart failure: Secondary | ICD-10-CM

## 2024-02-04 DIAGNOSIS — E119 Type 2 diabetes mellitus without complications: Secondary | ICD-10-CM | POA: Diagnosis not present

## 2024-02-04 NOTE — Progress Notes (Unsigned)
 Established Patient Office Visit  Subjective   Patient ID: [name] Daniel, male    DOB: [birth year]  Age: 68 y.o. MRN: [id number]  Chief Complaint  Patient presents with   6 month follow up     Trouble sleeping     Discussed the use of AI scribe software for clinical note transcription with the patient, who gave verbal consent to proceed.  History of Present Illness [name] Daniel is a 68 year old male with hypertension and heart failure who presents for a six-month follow-up.  His blood pressure medication, losartan , was adjusted to 12.5 mg. He experiences no headaches, chest pain, dizziness, shortness of breath, or visual changes. He was recently taken off digoxin  and has seen his cardiologist within the last four months, and a follow-up expected after Christmas.  He is not on specific diabetic medication but takes Jardiance  for heart and kidney health. His A1c was elevated in February, and he is due for a follow-up test today. He reports no issues with blood sugars.  He lives alone, does not follow a specific diet, and relies on weekly grocery trips. He consumes one or two non-diet sodas daily and has not consumed alcohol in the past couple of months.  He does not voice specific concerns or complaints today. Patient is quiet and hard to interview, as he is very short winded with answers and responses.        Review of Systems  Constitutional:  Negative for chills, fever and malaise/fatigue.  Eyes:  Negative for blurred vision and double vision.  Respiratory:  Negative for cough and shortness of breath.   Cardiovascular:  Negative for chest pain and palpitations.  Neurological:  Negative for dizziness and headaches.      Objective:     BP 103/71   Pulse 99   Temp (!) 97.5 F (36.4 C)   Ht 6' 1 (1.854 m)   Wt 157 lb (71.2 kg)   SpO2 98%   BMI 20.71 kg/m    Physical Exam Constitutional:      General: He is not in acute distress.    Appearance: Normal  appearance. He is normal weight. He is not ill-appearing.  HENT:     Head: Normocephalic and atraumatic.     Mouth/Throat:     Mouth: Mucous membranes are moist.     Pharynx: Oropharynx is clear.  Eyes:     Extraocular Movements: Extraocular movements intact.     Conjunctiva/sclera: Conjunctivae normal.  Cardiovascular:     Rate and Rhythm: Normal rate and regular rhythm.     Heart sounds: Normal heart sounds. No murmur heard. Pulmonary:     Effort: Pulmonary effort is normal.     Breath sounds: Normal breath sounds. No wheezing or rales.  Musculoskeletal:     Right lower leg: No edema.     Left lower leg: No edema.  Skin:    General: Skin is warm and dry.  Neurological:     General: No focal deficit present.     Mental Status: He is alert and oriented to person, place, and time.  Psychiatric:        Mood and Affect: Mood normal.        Behavior: Behavior normal.     No results found for any visits on 02/04/24.  The ASCVD Risk score (Arnett DK, et al., 2019) failed to calculate for the following reasons:   Cannot find a previous HDL lab   Cannot find a  previous total cholesterol lab    Assessment & Plan:   Return in about 6 months (around 08/03/2024).   Type 2 diabetes mellitus without complication, without long-term current use of insulin  (HCC) Assessment & Plan: Type 2 diabetes mellitus is monitored. A1c was elevated in February, updated A1c due today. He takes Jardiance  for heart and kidney health. He does not follow a specific low carb diet.  - A1c today and discuss potential need for additional diabetic medication based on A1c results. - Advised yearly eye exams. - Discussed low carb diet, including decreasing daily soda intake.  Orders: -     Hemoglobin A1c -     Microalbumin / creatinine urine ratio -     Lipid panel  Acute HFrEF (heart failure with reduced ejection fraction) (HCC) Assessment & Plan: Acute systolic heart failure is improving with  well-controlled blood pressure. Cardiology adjusted medications and discontinued digoxin  due to improved heart function. Due to follow up around December. - Continue current cardiology follow-up and medication regimen. - Ensure medication refills are available. - Warning signs reviewed with patient to include new or worsening chest pain, shortness of breath, leg swelling, weight gain, or difficulty breathing.    Primary hypertension Assessment & Plan: 103/71 Controlled. Continue current medications. No change in management. Discussed DASH diet and dietary sodium restrictions.  Continue dietary efforts and physical activity.   Orders: -     Microalbumin / creatinine urine ratio -     CBC with Differential/Platelet -     Lipid panel   Krishna Dancel, PA-C

## 2024-02-05 ENCOUNTER — Other Ambulatory Visit (HOSPITAL_COMMUNITY): Payer: Self-pay

## 2024-02-05 ENCOUNTER — Encounter: Payer: Self-pay | Admitting: Physician Assistant

## 2024-02-05 NOTE — Assessment & Plan Note (Signed)
 103/71 Controlled. Continue current medications. No change in management. Discussed DASH diet and dietary sodium restrictions.  Continue dietary efforts and physical activity.

## 2024-02-05 NOTE — Assessment & Plan Note (Signed)
 Acute systolic heart failure is improving with well-controlled blood pressure. Cardiology adjusted medications and discontinued digoxin  due to improved heart function. Due to follow up around December. - Continue current cardiology follow-up and medication regimen. - Ensure medication refills are available. - Warning signs reviewed with patient to include new or worsening chest pain, shortness of breath, leg swelling, weight gain, or difficulty breathing.

## 2024-02-05 NOTE — Assessment & Plan Note (Signed)
 Type 2 diabetes mellitus is monitored. A1c was elevated in February, updated A1c due today. He takes Jardiance  for heart and kidney health. He does not follow a specific low carb diet.  - A1c today and discuss potential need for additional diabetic medication based on A1c results. - Advised yearly eye exams. - Discussed low carb diet, including decreasing daily soda intake.

## 2024-02-21 ENCOUNTER — Other Ambulatory Visit (HOSPITAL_COMMUNITY): Payer: Self-pay

## 2024-03-04 ENCOUNTER — Other Ambulatory Visit (HOSPITAL_COMMUNITY): Payer: Self-pay

## 2024-03-05 ENCOUNTER — Other Ambulatory Visit (HOSPITAL_COMMUNITY): Payer: Self-pay

## 2024-03-06 ENCOUNTER — Other Ambulatory Visit (HOSPITAL_COMMUNITY): Payer: Self-pay

## 2024-03-23 ENCOUNTER — Other Ambulatory Visit (HOSPITAL_COMMUNITY): Payer: Self-pay

## 2024-03-23 ENCOUNTER — Other Ambulatory Visit (HOSPITAL_BASED_OUTPATIENT_CLINIC_OR_DEPARTMENT_OTHER): Payer: Self-pay

## 2024-03-24 ENCOUNTER — Other Ambulatory Visit (HOSPITAL_COMMUNITY): Payer: Self-pay

## 2024-04-03 ENCOUNTER — Other Ambulatory Visit (HOSPITAL_COMMUNITY): Payer: Self-pay

## 2024-04-15 ENCOUNTER — Other Ambulatory Visit (HOSPITAL_COMMUNITY): Payer: Self-pay | Admitting: Cardiology

## 2024-04-15 ENCOUNTER — Other Ambulatory Visit (HOSPITAL_COMMUNITY): Payer: Self-pay

## 2024-04-15 MED ORDER — ROSUVASTATIN CALCIUM 20 MG PO TABS
20.0000 mg | ORAL_TABLET | Freq: Every day | ORAL | 3 refills | Status: AC
  Filled 2024-04-15: qty 30, 30d supply, fill #0
  Filled 2024-05-14: qty 30, 30d supply, fill #1
  Filled 2024-06-22: qty 30, 30d supply, fill #2

## 2024-04-15 MED ORDER — SPIRONOLACTONE 25 MG PO TABS
25.0000 mg | ORAL_TABLET | Freq: Every day | ORAL | 3 refills | Status: AC
  Filled 2024-04-15: qty 30, 30d supply, fill #0
  Filled 2024-05-14: qty 30, 30d supply, fill #1
  Filled 2024-06-22: qty 30, 30d supply, fill #2

## 2024-04-21 ENCOUNTER — Other Ambulatory Visit (HOSPITAL_COMMUNITY): Payer: Self-pay

## 2024-04-21 ENCOUNTER — Other Ambulatory Visit: Payer: Self-pay

## 2024-05-07 ENCOUNTER — Other Ambulatory Visit (HOSPITAL_COMMUNITY): Payer: Self-pay

## 2024-05-14 ENCOUNTER — Other Ambulatory Visit (HOSPITAL_COMMUNITY): Payer: Self-pay

## 2024-06-09 ENCOUNTER — Other Ambulatory Visit: Payer: Self-pay

## 2024-06-09 ENCOUNTER — Other Ambulatory Visit (HOSPITAL_COMMUNITY): Payer: Self-pay

## 2024-06-22 ENCOUNTER — Other Ambulatory Visit (HOSPITAL_COMMUNITY): Payer: Self-pay

## 2024-07-07 ENCOUNTER — Other Ambulatory Visit (HOSPITAL_COMMUNITY): Payer: Self-pay

## 2024-07-30 ENCOUNTER — Ambulatory Visit: Admitting: Physician Assistant

## 2024-07-30 ENCOUNTER — Other Ambulatory Visit (HOSPITAL_COMMUNITY)

## 2024-08-03 ENCOUNTER — Ambulatory Visit: Admitting: Physician Assistant
